# Patient Record
Sex: Female | Born: 1955 | Race: White | Hispanic: No | Marital: Married | Smoking: Never smoker
Health system: Southern US, Community
[De-identification: ages and names within clinical notes are randomized; demographics above are authoritative.]

## PROBLEM LIST (undated history)

## (undated) ENCOUNTER — Emergency Department (HOSPITAL_COMMUNITY): Discharge status: Absent without leave | Payer: BC Managed Care – PPO

## (undated) DIAGNOSIS — I639 Cerebral infarction, unspecified: Secondary | ICD-10-CM

## (undated) DIAGNOSIS — I10 Essential (primary) hypertension: Secondary | ICD-10-CM

## (undated) DIAGNOSIS — G0490 Encephalitis and encephalomyelitis, unspecified: Secondary | ICD-10-CM

## (undated) DIAGNOSIS — G459 Transient cerebral ischemic attack, unspecified: Secondary | ICD-10-CM

## (undated) HISTORY — PX: ABDOMINAL HYSTERECTOMY: SHX81

## (undated) HISTORY — DX: Transient cerebral ischemic attack, unspecified: G45.9

## (undated) HISTORY — DX: Encephalitis and encephalomyelitis, unspecified: G04.90

## (undated) HISTORY — DX: Cerebral infarction, unspecified: I63.9

---

## 1999-11-14 ENCOUNTER — Other Ambulatory Visit: Admission: RE | Admit: 1999-11-14 | Discharge: 1999-11-14 | Payer: Self-pay | Admitting: Gynecology

## 2000-11-18 ENCOUNTER — Other Ambulatory Visit: Admission: RE | Admit: 2000-11-18 | Discharge: 2000-11-18 | Payer: Self-pay | Admitting: Obstetrics and Gynecology

## 2002-02-07 ENCOUNTER — Other Ambulatory Visit: Admission: RE | Admit: 2002-02-07 | Discharge: 2002-02-07 | Payer: Self-pay | Admitting: Obstetrics and Gynecology

## 2003-03-01 ENCOUNTER — Other Ambulatory Visit: Admission: RE | Admit: 2003-03-01 | Discharge: 2003-03-01 | Payer: Self-pay | Admitting: Obstetrics and Gynecology

## 2003-11-23 ENCOUNTER — Encounter (INDEPENDENT_AMBULATORY_CARE_PROVIDER_SITE_OTHER): Payer: Self-pay | Admitting: Specialist

## 2003-11-23 ENCOUNTER — Inpatient Hospital Stay (HOSPITAL_COMMUNITY): Admission: RE | Admit: 2003-11-23 | Discharge: 2003-11-25 | Payer: Self-pay | Admitting: Obstetrics and Gynecology

## 2006-01-25 ENCOUNTER — Other Ambulatory Visit: Admission: RE | Admit: 2006-01-25 | Discharge: 2006-01-25 | Payer: Self-pay | Admitting: Obstetrics and Gynecology

## 2008-02-24 ENCOUNTER — Other Ambulatory Visit: Admission: RE | Admit: 2008-02-24 | Discharge: 2008-02-24 | Payer: Self-pay | Admitting: Obstetrics and Gynecology

## 2009-07-09 ENCOUNTER — Ambulatory Visit: Payer: Self-pay | Admitting: Obstetrics and Gynecology

## 2009-07-09 ENCOUNTER — Ambulatory Visit (HOSPITAL_COMMUNITY): Admission: RE | Admit: 2009-07-09 | Discharge: 2009-07-09 | Payer: Self-pay | Admitting: Obstetrics and Gynecology

## 2009-08-01 ENCOUNTER — Ambulatory Visit (HOSPITAL_COMMUNITY): Admission: RE | Admit: 2009-08-01 | Discharge: 2009-08-01 | Payer: Self-pay | Admitting: Obstetrics and Gynecology

## 2010-12-12 NOTE — Discharge Summary (Signed)
NAME:  Shawna Waters, Shawna Waters                           ACCOUNT NO.:  [id number]   MEDICAL RECORD NO.:  [id number]                   PATIENT TYPE:  INP   LOCATION:  9312                                 FACILITY:  [facility]   PHYSICIAN:  Daniel L. Eda Paschal, M.D.           DATE OF BIRTH:  1955/08/06   DATE OF ADMISSION:  11/23/2003  DATE OF DISCHARGE:  11/25/2003                                 DISCHARGE SUMMARY   The patient is a 55 year old female with persistent chronic pelvic pain,  right adnexal mass who entered the hospital for surgery.  On the day of  admission, she was taken to the operating room and exploratory laparotomy  was performed.  Findings were dense omental adhesions and bowel adhesions to  both adnexae.  There was a small right ovarian cyst.  The patient underwent  lysis of adhesions, bilateral salpingo-oophorectomy, and partial omentectomy  where the omentum was inflamed chronically from attachment to the above.  Postoperatively, her course progressed and by the second postoperative day,  she was voiding, passing gas, was afebrile, eating and was ready for  discharge.   DISCHARGE MEDICATIONS:  Motrin 800 mg p.r.n.   WOUND CARE:  Routine.   FOLLOW UP:  She will return to the office in two days for staple removal.   Final pathology report is not available at the time of dictation.   CONDITION ON DISCHARGE:  Improved.   DISCHARGE DIAGNOSES:  1. Right adnexal mass.  2. Pelvic adhesive disease.  3. Chronic pelvic pain.  4. Exploratory laparotomy with lysis of adhesions.  5. Bilateral salpingo-oophorectomy.  6. Partial omentectomy.                                               Daniel L. Eda Paschal, M.D.    Shawna Waters  D:  11/25/2003  T:  11/26/2003  Job:  [id number]

## 2010-12-12 NOTE — Op Note (Signed)
NAME:  Shawna Waters, Shawna Waters                           ACCOUNT NO.:  [id number]   MEDICAL RECORD NO.:  [id number]                   PATIENT TYPE:  INP   LOCATION:  9399                                 [facility]ACILITY:  [facility]   PHYSICIAN:  Daniel L. Eda Paschal, M.D.           DATE OF BIRTH:  11/05/1955   DATE OF PROCEDURE:  11/23/2003  DATE OF DISCHARGE:                                 OPERATIVE REPORT   PREOPERATIVE DIAGNOSIS:  Chronic pelvic pain with right adnexal mass.   POSTOPERATIVE DIAGNOSES:  1. Chronic pelvic adhesive disease with omental adhesions and inflammation     of the omentum.  2. Small right ovarian cyst.   OPERATION:  1. Exploratory laparotomy with lysis of all pelvic adhesions.  2. Bilateral salpingo-oophorectomy.  3. Partial omentectomy.   SURGEON:  Daniel L. Eda Paschal, M.D.   FIRST ASSISTANT:  Juan H. Lily Peer, M.D.   FINDINGS:  At the time of the surgery there was omentum adherent to both  adnexa, making the adnexa almost invisible.  Once the adhesions were freed  up, the patient had had a small right ovarian cyst, which had ruptured  during the dissection.  She had some chronic inflammation of where the  omentum was adherent to the ovary and other than that, all findings were  normal.  The ileocecal junction was identified, and the patient had a normal  appendix.   PROCEDURE:  After adequate general endotracheal anesthesia, the patient was  placed in the supine position, prepped and draped in the usual sterile  manner.  A Foley catheter was inserted in the patient's bladder.  The  patient's previous Pfannenstiel incision was used.  The fascia was opened  transversely.  The peritoneum was entered vertically.  Subcutaneous bleeders  were clamped and bovied as encountered.  When the peritoneal cavity was  opened, the above findings were noted.  Peritoneal washings were not  initially obtained because you could not even see the adnexa and I was not  really sure if she  still had her right adnexal mass.  The indications for  surgery had been pelvic pain as well as the mass, and therefore she had not  been re-scanned although we had had to cancel her surgery previously because  of an upper respiratory infection.  The omentum was densely adherent into  the pelvis.  As it was freed up, a portion of the ovary that was densely  adherent to it avulsed from the rest of the ovary, and there appeared to be  a small ovarian cyst in it.  It had had a bluish color but had ruptured  during the dissection.  It had a very non-suspicious appearance.  The  portion of the ovary with the cyst was dissected free from the omentum, was  sent separate for pathology.  The omental adhesions to the pelvis were  completely freed up by sharp dissection.  There was a lot of  inflammation of  the omentum and it was felt that this omentum should be removed, and  therefore a partial omentectomy was done with [facility].  All bleeding to the  omentum was controlled with 3-0 plain sutures.  Finally both adnexa were  free .  First on the right the round ligament was sutured and cut.  The  retroperitoneal space was entered.  The ureter was identified.  The IP  ligament was clamped, cut, and doubly suture ligated with #1 chromic catgut,  and then the ovary and tube were freed up from the rest of the peritoneum.  This whole area was extremely vascular and seemed to be chronically  inflamed, and therefore several areas had to be bovied with the Bovie tip.  The ureter was noted throughout the dissection so that it was not  compromised.  A hot pack was then placed there because there was still some  chronic ooze, and the third specimen sent was the remainder of the right  ovary and tube.  The first specimen had been the first portion of the ovary  as described above, and the second portion was the omentum.  Attention was  next turned to the left adnexa.  The round ligament was opened after being   sutured.  The retroperitoneal space was entered.  The ureter was identified.  The IP ligament was clamped and doubly suture ligated with #1 chromic  catgut.  Once again the area was very vascular when the ovary and tube were  freed up from the peritoneum, but this could be done.  Several areas had to  be bovied again.  The ureter was watched throughout the procedure, and then  the left ovary and tube were also sent to pathology for tissue diagnosis.  Hot packs were now placed in both adnexal beds.  After they were removed,  there was almost no bleeding whatsoever but Surgicel was left in both beds  anyway for hemostasis.  It was very clear that we had not left an ovarian  remnant.  Two sponge, needle, and instrument counts were correct.  The  peritoneum was closed with a running 0 Vicryl,  subfascial and suprafascial  spaces were copiously irrigated with Ringer's lactate, and then the fascia  was closed with two running 0 Vicryls.  The skin was then closed with  staples.  Estimated blood loss for the entire procedure was 150 mL with none  replaced.  The patient left the operating room in satisfactory condition  draining clear urine from her Foley catheter.                                               Daniel L. Eda Paschal, M.D.    Tonette Bihari  D:  11/23/2003  T:  11/23/2003  Job:  [id number]

## 2010-12-12 NOTE — H&P (Signed)
NAME:  Shawna Waters, Shawna Waters                           ACCOUNT NO.:  [id number]   MEDICAL RECORD NO.:  [id number]                   PATIENT TYPE:  INP   LOCATION:  NA                                   [facility]   PHYSICIAN:  Daniel L. Eda Paschal, M.D.           DATE OF BIRTH:  Nov 20, 1955   DATE OF ADMISSION:  DATE OF DISCHARGE:                                HISTORY & PHYSICAL   CHIEF COMPLAINT:  Pelvic pain, especially in the right lower quadrant.   HISTORY OF PRESENT ILLNESS:  The patient is a 55 year old Gravida I, Para 1,  AB 0 who came to see [place] in February with a one to two month history of right  lower quadrant pain.  It will intermittently go to the left lower quadrant  as well. It is hard to walk, it is hard for her to move and it is hard to  bend.  When she has a full bladder it hurts.  When she has a bowel movement  it hurts.  Intercourse has become very uncomfortable.  This pain has really  been going on a lot longer than the above, but it just got worse in  February.  She has a history of recurrent ovarian cysts in the past and when  she had her total abdominal hysterectomy for dysmenorrhea she also had an  ovarian cystectomy and that was in 1997.  When she first presented with this  at the end of February, she underwent an ultrasound which showed a right  adnexal mass.  It is approximately 6 x 5 x 4 cm and it is both cystic and  solid.  It has a reticular echo pattern.  It has a thick septation and it is  very tender to the touch.  Her surgery was originally scheduled earlier, but  it was canceled because she developed a severe upper respiratory infection.  We have not repeated the ultrasound to see if the mass persists because the  patient continues to have pain and it is felt that exploratory laparotomy  for the above is appropriate regardless of the change in ultrasound.  She  would like to have a bilateral salpingo-oophorectomy to prevent recurrences  of the above.  She  understands the issues with hormone replacement and  therefore we plan to do an exploratory laparotomy with bilateral salpingo-  oophorectomy.   PAST MEDICAL HISTORY:  1. Cesarean section.  2. Total abdominal hysterectomy and right ovarian cystectomy for pain and     dysmenorrhea.  3. Previous laparoscopy for dysmenorrhea.  4. T&A.   MEDICATIONS:  1. Advil.  2. Aleve.   ALLERGIES:  She is allergic to no drugs.   FAMILY HISTORY:  Negative except for coronary artery disease with both her  father and mother.   SOCIAL HISTORY:  She is a non-smoker and a non-drinker.   REVIEW OF SYMPTOMS:  HEENT:  Negative.  CARDIAC:  Negative.  RESPIRATORY:  Negative.  GI:  See above.  GU:  See above.  NEUROLOGIC:  Negative.  PSYCHIATRIC:  Negative.  ALLERGIC/IMMUNOLOGIC:  Negative.  LYMPHATIC:  Negative.  ENDOCRINE:  Negative.   PHYSICAL EXAMINATION:  GENERAL:  The patient is a well developed, well  nourished female in no acute distress.  VITAL SIGNS:  Her blood pressure is 120/86, pulse is 80 and regular,  respirations 16 and non-labored and she is afebrile.  HEENT:  All within normal limits.  The neck is supple.  Trachea is midline.  The thyroid is not enlarged.  LUNGS:  Clear to percussion and auscultation.  HEART:  No thrills, heaves or murmurs.  BREASTS:  No masses.  ABDOMEN: Soft without guarding, rebound or masses.  PELVIC:  External is normal.  Bartholin's, urethral and Skein's glands are  negative.  Vaginal is normal.  Pap smear shows no atypia.  Bimanual and  rectal examinations reveal extreme tenderness on the right making it very  difficult for assessment of the ovary.  EXTREMITIES:  Normal.   IMPRESSION:  Pelvic pain with probable persistent right adnexal mass.   PLAN:  Exploratory laparotomy with bilateral salpingo-oophorectomy.                                               Daniel L. Eda Paschal, M.D.    Tonette Bihari  D:  11/22/2003  T:  11/22/2003  Job:  [id number]

## 2014-09-27 ENCOUNTER — Observation Stay (HOSPITAL_COMMUNITY)
Admission: EM | Admit: 2014-09-27 | Discharge: 2014-09-28 | Disposition: A | Discharge status: Home / Self Care | Payer: BC Managed Care – PPO | Attending: Internal Medicine | Admitting: Internal Medicine

## 2014-09-27 ENCOUNTER — Encounter (HOSPITAL_COMMUNITY): Payer: Self-pay

## 2014-09-27 ENCOUNTER — Emergency Department (HOSPITAL_COMMUNITY): Payer: BC Managed Care – PPO

## 2014-09-27 DIAGNOSIS — Z6837 Body mass index (BMI) 37.0-37.9, adult: Secondary | ICD-10-CM | POA: Insufficient documentation

## 2014-09-27 DIAGNOSIS — G459 Transient cerebral ischemic attack, unspecified: Secondary | ICD-10-CM

## 2014-09-27 DIAGNOSIS — E663 Overweight: Secondary | ICD-10-CM

## 2014-09-27 DIAGNOSIS — R739 Hyperglycemia, unspecified: Secondary | ICD-10-CM

## 2014-09-27 DIAGNOSIS — R531 Weakness: Secondary | ICD-10-CM

## 2014-09-27 DIAGNOSIS — Z6841 Body Mass Index (BMI) 40.0 and over, adult: Secondary | ICD-10-CM | POA: Diagnosis not present

## 2014-09-27 DIAGNOSIS — I10 Essential (primary) hypertension: Secondary | ICD-10-CM | POA: Diagnosis not present

## 2014-09-27 DIAGNOSIS — R7309 Other abnormal glucose: Secondary | ICD-10-CM

## 2014-09-27 DIAGNOSIS — H538 Other visual disturbances: Secondary | ICD-10-CM | POA: Diagnosis not present

## 2014-09-27 DIAGNOSIS — R42 Dizziness and giddiness: Secondary | ICD-10-CM | POA: Insufficient documentation

## 2014-09-27 HISTORY — DX: Transient cerebral ischemic attack, unspecified: G45.9

## 2014-09-27 LAB — RAPID URINE DRUG SCREEN, HOSP PERFORMED
Amphetamines: NOT DETECTED
BARBITURATES: NOT DETECTED
Benzodiazepines: NOT DETECTED
COCAINE: NOT DETECTED
Opiates: NOT DETECTED
TETRAHYDROCANNABINOL: NOT DETECTED

## 2014-09-27 LAB — CBC
HEMATOCRIT: 40.6 % (ref 36.0–46.0)
HEMOGLOBIN: 13.8 g/dL (ref 12.0–15.0)
MCH: 32.2 pg (ref 26.0–34.0)
MCHC: 34 g/dL (ref 30.0–36.0)
MCV: 94.9 fL (ref 78.0–100.0)
Platelets: 319 10*3/uL (ref 150–400)
RBC: 4.28 MIL/uL (ref 3.87–5.11)
RDW: 12.6 % (ref 11.5–15.5)
WBC: 8.3 10*3/uL (ref 4.0–10.5)

## 2014-09-27 LAB — I-STAT TROPONIN, ED: Troponin i, poc: 0 ng/mL (ref 0.00–0.08)

## 2014-09-27 LAB — BASIC METABOLIC PANEL
Anion gap: 6 (ref 5–15)
BUN: 18 mg/dL (ref 6–23)
CALCIUM: 9.3 mg/dL (ref 8.4–10.5)
CO2: 26 mmol/L (ref 19–32)
CREATININE: 0.99 mg/dL (ref 0.50–1.10)
Chloride: 107 mmol/L (ref 96–112)
GFR calc Af Amer: 72 mL/min — ABNORMAL LOW (ref >90)
GFR, EST NON AFRICAN AMERICAN: 62 mL/min — AB (ref >90)
GLUCOSE: 108 mg/dL — AB (ref 70–99)
Potassium: 4.2 mmol/L (ref 3.5–5.1)
SODIUM: 139 mmol/L (ref 135–145)

## 2014-09-27 LAB — GLUCOSE, CAPILLARY: Glucose-Capillary: 131 mg/dL — ABNORMAL HIGH (ref 70–99)

## 2014-09-27 MED ORDER — SODIUM CHLORIDE 0.9 % IV SOLN
INTRAVENOUS | Status: AC
  Administered 2014-09-27: 23:00:00 via INTRAVENOUS

## 2014-09-27 MED ORDER — HYDRALAZINE HCL 20 MG/ML IJ SOLN: 10.0000 mg | Freq: Four times a day (QID) | INTRAMUSCULAR | Status: DC | PRN

## 2014-09-27 MED ORDER — HYDROCHLOROTHIAZIDE 12.5 MG PO CAPS
12.5000 mg | ORAL_CAPSULE | Freq: Every day | ORAL | Status: DC
  Administered 2014-09-28: 12.5 mg via ORAL
  Filled 2014-09-27: qty 1

## 2014-09-27 MED ORDER — ACETAMINOPHEN 325 MG PO TABS: 650.0000 mg | ORAL_TABLET | ORAL | Status: DC | PRN

## 2014-09-27 MED ORDER — STROKE: EARLY STAGES OF RECOVERY BOOK
Freq: Once | Status: AC
  Administered 2014-09-27
  Filled 2014-09-27: qty 1

## 2014-09-27 MED ORDER — LABETALOL HCL 5 MG/ML IV SOLN
10.0000 mg | Freq: Once | INTRAVENOUS | Status: DC
  Filled 2014-09-27: qty 4

## 2014-09-27 MED ORDER — SODIUM CHLORIDE 0.9 % IV SOLN
INTRAVENOUS | Status: DC
  Administered 2014-09-27: 21:00:00 via INTRAVENOUS

## 2014-09-27 MED ORDER — LISINOPRIL 20 MG PO TABS
20.0000 mg | ORAL_TABLET | Freq: Every day | ORAL | Status: DC
  Administered 2014-09-28: 20 mg via ORAL
  Filled 2014-09-27: qty 1

## 2014-09-27 MED ORDER — LABETALOL HCL 5 MG/ML IV SOLN
10.0000 mg | Freq: Once | INTRAVENOUS | Status: AC
  Administered 2014-09-27: 10 mg via INTRAVENOUS

## 2014-09-27 MED ORDER — ENOXAPARIN SODIUM 40 MG/0.4ML ~~LOC~~ SOLN
40.0000 mg | Freq: Every day | SUBCUTANEOUS | Status: DC
  Administered 2014-09-27: 40 mg via SUBCUTANEOUS
  Filled 2014-09-27: qty 0.4

## 2014-09-27 MED ORDER — LORAZEPAM 1 MG PO TABS
1.0000 mg | ORAL_TABLET | Freq: Once | ORAL | Status: AC
Start: 2014-09-28 — End: 2014-09-28
  Administered 2014-09-28: 1 mg via ORAL
  Filled 2014-09-27: qty 1

## 2014-09-27 MED ORDER — ONDANSETRON HCL 4 MG/2ML IJ SOLN
4.0000 mg | Freq: Once | INTRAMUSCULAR | Status: AC
  Administered 2014-09-27: 4 mg via INTRAVENOUS
  Filled 2014-09-27: qty 2

## 2014-09-27 NOTE — ED Provider Notes (Signed)
CSN: [id number]     Arrival date & time 09/27/14  1950 History   First MD Initiated Contact with Patient 09/27/14 2022     Chief Complaint  Patient presents with  . Hypertension  . Dizziness     (Consider location/radiation/quality/duration/timing/severity/associated sxs/prior Treatment) HPI   Shawna Waters is a [age] y.o. female is no known past medical history, has never had any primary care, has never been a smoker complaining of generalized weakness which she describes as worse in the right upper extremity with associated heaviness and nausea with lightheadedness. She had this sensation last night, symptoms resolved and it occurred this afternoon at 4:30, last approximately 10 minutes and went away. Symptoms recurred this evening, she didn't have any objective signs of weakness (she had not dropped anything) she proceeded to cook dinner without issue. She states the symptoms are improved but feel that they are still present. Patient denies chest pain, shortness of breath, syncope, nausea, vomiting, dysarthria, ataxia, change in vision, difficulty walking, cervicalgia.  History reviewed. No pertinent past medical history.    Past Surgical History  Procedure Laterality Date  . Abdominal hysterectomy     History reviewed. No pertinent family history. History  Substance Use Topics  . Smoking status: Never Smoker   . Smokeless tobacco: Not on file  . Alcohol Use: No   OB History    No data available     Review of Systems  10 systems reviewed and found to be negative, except as noted in the HPI.   Allergies  Codeine  Home Medications   Prior to Admission medications   Medication Sig Start Date End Date Taking? Authorizing Provider  acetaminophen (TYLENOL) 500 MG tablet Take 1,000 mg by mouth every 6 (six) hours as needed for headache (headache & sinuses).   Yes Historical Provider, MD   BP 170/76 mmHg  Pulse 74  Temp(Src) 98.6 F (37 C) (Oral)  Resp 14  Ht 5\' 1"  (1.549  m)  Wt 228 lb 1.6 oz (103.465 kg)  BMI 43.12 kg/m2  SpO2 93% Physical Exam  Constitutional: She is oriented to person, place, and time. She appears well-developed and well-nourished. No distress.  HENT:  Head: Normocephalic.  Eyes: Conjunctivae and EOM are normal.  Cardiovascular: Normal rate, regular rhythm and intact distal pulses.   Pulmonary/Chest: Effort normal and breath sounds normal. No stridor. No respiratory distress. She has no wheezes. She has no rales. She exhibits no tenderness.  Abdominal: Soft. Bowel sounds are normal. She exhibits no distension and no mass. There is no tenderness. There is no rebound and no guarding.  Musculoskeletal: Normal range of motion.  Neurological: She is alert and oriented to person, place, and time.  II-Visual fields grossly intact. III/IV/VI-Extraocular movements intact.  Pupils reactive bilaterally. V/VII-Smile symmetric, equal eyebrow raise,  facial sensation intact VIII- Hearing grossly intact IX/X-Normal gag XI-bilateral shoulder shrug XII-midline tongue extension Motor: 5/5 bilaterally with normal tone and bulk Cerebellar: Normal finger-to-nose  and normal heel-to-shin test.   Romberg negative Ambulates with a coordinated gait   Psychiatric: She has a normal mood and affect.  Nursing note and vitals reviewed.   ED Course  Procedures (including critical care time) Labs Review Labs Reviewed  BASIC METABOLIC PANEL - Abnormal; Notable for the following:    Glucose, Bld 108 (*)    GFR calc non Af Amer 62 (*)    GFR calc Af Amer 72 (*)    All other components within normal limits  CBC  URINE RAPID DRUG SCREEN (HOSP PERFORMED)  LIPID PANEL  CBC  COMPREHENSIVE METABOLIC PANEL  HEMOGLOBIN A1C  TSH  CBG MONITORING, ED  I-STAT TROPOININ, ED    Imaging Review Ct Head Wo Contrast  09/27/2014   CLINICAL DATA:  Weakness and dizziness  EXAM: CT HEAD WITHOUT CONTRAST  TECHNIQUE: Contiguous axial images were obtained from the base of  the skull through the vertex without intravenous contrast.  COMPARISON:  None.  FINDINGS: The bony calvarium is intact. No gross soft tissue abnormality is noted. No findings to suggest acute hemorrhage, acute infarction or space-occupying mass lesion are noted.  IMPRESSION: No acute intracranial abnormality noted.   Electronically Signed   By: Alcide Clever M.D.   On: 09/27/2014 21:42     EKG Interpretation   Date/Time:  Thursday September 27 2014 20:02:32 EST Ventricular Rate:  85 PR Interval:  131 QRS Duration: 82 QT Interval:  394 QTC Calculation: 468 R Axis:   58 Text Interpretation:  Sinus rhythm Low voltage, precordial leads No  previous ECGs available Confirmed by YAO  MD, DAVID ([id number]) on 09/27/2014  8:30:40 PM      MDM   Final diagnoses:  Weakness  Transient cerebral ischemia, unspecified transient cerebral ischemia type  TIA (transient ischemic attack)    Filed Vitals:   09/27/14 2200 09/27/14 2230 09/27/14 2303 09/27/14 2322  BP: 186/88 170/76    Pulse: 78 74    Temp:   98.6 F (37 C)   TempSrc:      Resp: 21 14    Height:     (1.549 m)  Weight:    228 lb 1.6 oz (103.465 kg)  SpO2: 96% 93%      Medications  0.9 %  sodium chloride infusion ( Intravenous New Bag/Given 09/27/14 2248)  enoxaparin (LOVENOX) injection 40 mg (40 mg Subcutaneous Given 09/27/14 2344)  acetaminophen (TYLENOL) tablet 650 mg (not administered)  hydrALAZINE (APRESOLINE) injection 10 mg (not administered)  lisinopril (PRINIVIL,ZESTRIL) tablet 20 mg (not administered)  hydrochlorothiazide (MICROZIDE) capsule 12.5 mg (not administered)  ondansetron (ZOFRAN) injection 4 mg (4 mg Intravenous Given 09/27/14 2049)  labetalol (NORMODYNE,TRANDATE) injection 10 mg (10 mg Intravenous Given 09/27/14 2249)   stroke: mapping our early stages of recovery book ( Does not apply Given 09/27/14 2344)    Shawna Waters is a pleasant 59 y.o. female presenting with lightheadedness and what she describes as right arm  heaviness, waxing and waning over the course last 24 hours. Patient states he symptoms are persistent but resolving. Neuro exam is nonfocal. Case discussed with attending physician who agrees with not calling a code stroke. Differential includes hypertensive emergency versus TIA.  CT negative. Case discussed with [facility] hospitalist Dr. Gwenlyn Perking who accepts admission, he recommends labetalol IV to manage her blood pressure.       Wynetta Emery, PA-C 09/27/14 2348  Richardean Canal, MD 10/01/14 [phone]

## 2014-09-27 NOTE — ED Notes (Signed)
Patient transported to CT 

## 2014-09-27 NOTE — Progress Notes (Signed)
EDCM spoke to patient's husband at bedside.  Patient in radiology. Patient's husband confirms patient has [employer] insurance and she does not have a pcp.  EDCM provided patient's husband with a list of pcps who accept [employer] insurance within a ten mile radius of patient's zip code [phone].  Patient's husband thankful for resources.  No further EDCM needs at this time.

## 2014-09-27 NOTE — H&P (Addendum)
[facility] History and Physical  Shawna Waters WUJ:[id number] DOB: Apr 05, 1956 DOA: 09/27/2014  Referring physician: Dr. Silverio Lay PCP: No primary care provider on file.   Chief Complaint: Blurred vision, headache, dizziness/lightheadedness, right arm weakness/heaviness sensation  HPI: Shawna Waters is a 59 y.o. female with a past medical history significant for overweight; who presented to the emergency department secondary to intermittent episodes of right arm weakness/heaviness sensation, lightheadedness and blurred vision. Patient reports the symptoms has been going on for the last 36-48 hours prior to admission (on and off). Patient reports that on the date of admission the symptoms lasted a little bit longer and were associated with nausea, HA and general malaise. She denies chest pain, chills/fever, shortness of breath, vomiting, abdominal pain, dysuria, melena and hematochezia. In the ED patient was found with a blood pressure of 207/110; blood work essentially unremarkable except for mild hyperglycemia; negative CT of the head and no abnormalities on EKG. Given her symptoms and presentation [facility] hospitalist has been called to admit for TIA.  Review of Systems:  Negative except as otherwise mentioned in history of present illness  History reviewed. No pertinent past medical history. Past Surgical History  Procedure Laterality Date  . Abdominal hysterectomy     Social History:  reports that she has never smoked. She does not have any smokeless tobacco history on file. She reports that she does not drink alcohol or use illicit drugs.  Allergies  Allergen Reactions  . Codeine    Family history: Father with history of cholesterol; and also grandmother with diabetes. Otherwise family history noncontributory according to patient  Prior to Admission medications   Medication Sig Start Date End Date Taking? Authorizing Provider  acetaminophen (TYLENOL) 500 MG tablet Take 1,000 mg by  mouth every 6 (six) hours as needed for headache (headache & sinuses).   Yes Historical Provider, MD   Physical Exam: Filed Vitals:   09/27/14 2051 09/27/14 2100 09/27/14 2200 09/27/14 2230  BP: 207/94 195/104 186/88 170/76  Pulse: 81 76 78 74  Temp:      TempSrc:      Resp: SpO2: 99% 98% 96% 93%    Wt Readings from Last 3 Encounters:  No data found for Wt    General:  Appears calm and comfortable; currently denying any abnormalities. Able to follow commands, normal speech, no facial droop, alert, awake and oriented X3. Afebrile Eyes: PERRL, normal lids, irises & conjunctiva, no icterus, no nystagmus; extraocular muscles intact on exam ENT: grossly normal hearing, lips & tongue, moist mucous membranes, no erythema, no exudates; no drainage out of her ears or nostrils Neck: no LAD, masses or thyromegaly; no JVD Cardiovascular: RRR, no m/r/g. Trace LE edema. Telemetry: SR, no arrhythmias appreciated on EKG and telemetry monitor in the ED Respiratory: CTA bilaterally, no w/r/r. Normal respiratory effort. Abdomen: soft, Obese, nontender, nondistended, positive bowel sounds Skin: no rash, petechiae, open wounds or induration seen on exam Musculoskeletal: grossly normal tone BUE/BLE; no joint swelling, FROM Psychiatric: grossly normal mood and affect, speech fluent and appropriate Neurologic: grossly non-focal. Muscle strength 5/5 bilaterally and symmetrically; cranial nerve II-12 within normal limits; normal finger to nose and normal pin-prick and proprioception.          Labs on Admission:  Basic Metabolic Panel:  Recent Labs Lab 09/27/14 2011  NA 139  K 4.2  CL 107  CO2 26  GLUCOSE 108*  BUN 18  CREATININE 0.99  CALCIUM 9.3  CBC:  Recent Labs Lab 09/27/14 2011  WBC 8.3  HGB 13.8  HCT 40.6  MCV 94.9  PLT 319   Radiological Exams on Admission: Ct Head Wo Contrast  09/27/2014   CLINICAL DATA:  Weakness and dizziness  EXAM: CT HEAD WITHOUT CONTRAST   TECHNIQUE: Contiguous axial images were obtained from the base of the skull through the vertex without intravenous contrast.  COMPARISON:  None.  FINDINGS: The bony calvarium is intact. No gross soft tissue abnormality is noted. No findings to suggest acute hemorrhage, acute infarction or space-occupying mass lesion are noted.  IMPRESSION: No acute intracranial abnormality noted.   Electronically Signed   By: Alcide CleverMark  Lukens M.D.   On: 09/27/2014 21:42    EKG:  Sinus rhythm, no acute ischemic changes appreciated on EKG  Assessment/Plan 1-TIA (transient ischemic attack): Patient with transient ischemic attack symptoms (right arm heaviness and weakness, lightheadedness and blurred vision), symptoms resolved spontaneously, especially once blood pressure was under better control. Patient with CT head negative for acute intracranial abnormalities (ruling out with these any hemorrhagic components). -Will admit to telemetry -Will follow TIA order sets protocol (MRI/MRA, 2-D echo, carotid Dopplers; will check TSH, lipid panel and A1c) -Patient has passed swallowing evaluation while in the ED and she will be started on heart healthy diet. -In order to continue controlling blood pressure will initiate lisinopril 20 mg by mouth daily and also HCTZ 12.5 mg by mouth daily. -PRN Hydralazine will be also available for systolic blood pressure above 161180 and/or diastolic blood pressure more than 110   2-HTN (hypertension)/accelerated HTN: Newly diagnosed and most likely cause for TIA. -As mentioned above will initiate treatment with HCTZ and lisinopril  -Patient advised to follow a low-sodium diet and also to lose weight   3-Obesity: Low calorie diet and exercise discussed with patient. -Patient is motivated and was already following healthier habits prior to this admission. -Body mass index is 43.12 kg/(m^2).  4-hyperglycemia: Without history of diabetes. -Will check hemoglobin A1c  5-Social: CM ordered has  been placed for assistance establishing PCP  Please follow MRI results and if needed discussed with the Neurohospitalist in the morning..  Code Status: Full code DVT Prophylaxis: Lovenox Family Communication: Husband and sister at bedside Disposition Plan: Telemetry bed, observation; LOS less than 2 midnights  Time spent: 55 minutes  Vassie LollMadera, Colby Catanese [facility] Pager [phone]

## 2014-09-27 NOTE — ED Notes (Signed)
Pt states started having all over weakness and dizziness this evening around 1630, went away and she cooked dinner, then started having the weakness/nausea and dizziness again, denies shortness of breath, denies chest pain or any other pain, denies any medical hx. Pt ambulated to restroom w/o difficulty.

## 2014-09-27 NOTE — Progress Notes (Signed)
While this RN was out of the room, patient began to fell "heavy", nauseated, and weaker than she felt only a few minutes before. Pt states she also felt suddenly hot. Neuro assessment is negative. Nausea has passed, but heaviness and feelings of weakness remain. Will continue to monitor.

## 2014-09-27 NOTE — ED Notes (Signed)
Pt reports R arm weakness, dizziness, blurred vision which started today at 1630 which resolved and recurred at 1830 tonight.

## 2014-09-28 ENCOUNTER — Observation Stay (HOSPITAL_COMMUNITY): Payer: BC Managed Care – PPO

## 2014-09-28 DIAGNOSIS — G459 Transient cerebral ischemic attack, unspecified: Secondary | ICD-10-CM

## 2014-09-28 LAB — CBC
HCT: 38.5 % (ref 36.0–46.0)
Hemoglobin: 12.4 g/dL (ref 12.0–15.0)
MCH: 30.8 pg (ref 26.0–34.0)
MCHC: 32.2 g/dL (ref 30.0–36.0)
MCV: 95.8 fL (ref 78.0–100.0)
PLATELETS: 321 10*3/uL (ref 150–400)
RBC: 4.02 MIL/uL (ref 3.87–5.11)
RDW: 12.7 % (ref 11.5–15.5)
WBC: 7.3 10*3/uL (ref 4.0–10.5)

## 2014-09-28 LAB — LIPID PANEL
CHOLESTEROL: 183 mg/dL (ref 0–200)
HDL: 40 mg/dL (ref >39)
LDL Cholesterol: 112 mg/dL — ABNORMAL HIGH (ref 0–99)
TRIGLYCERIDES: 157 mg/dL — AB (ref <150)
Total CHOL/HDL Ratio: 4.6 RATIO
VLDL: 31 mg/dL (ref 0–40)

## 2014-09-28 LAB — COMPREHENSIVE METABOLIC PANEL
ALK PHOS: 74 U/L (ref 39–117)
ALT: 22 U/L (ref 0–35)
ANION GAP: 9 (ref 5–15)
AST: 24 U/L (ref 0–37)
Albumin: 3.8 g/dL (ref 3.5–5.2)
BILIRUBIN TOTAL: 0.4 mg/dL (ref 0.3–1.2)
BUN: 14 mg/dL (ref 6–23)
CO2: 23 mmol/L (ref 19–32)
CREATININE: 0.89 mg/dL (ref 0.50–1.10)
Calcium: 8.7 mg/dL (ref 8.4–10.5)
Chloride: 107 mmol/L (ref 96–112)
GFR calc Af Amer: 82 mL/min — ABNORMAL LOW (ref >90)
GFR, EST NON AFRICAN AMERICAN: 71 mL/min — AB (ref >90)
Glucose, Bld: 102 mg/dL — ABNORMAL HIGH (ref 70–99)
Potassium: 3.8 mmol/L (ref 3.5–5.1)
Sodium: 139 mmol/L (ref 135–145)
Total Protein: 6.7 g/dL (ref 6.0–8.3)

## 2014-09-28 LAB — GLUCOSE, CAPILLARY
Glucose-Capillary: 75 mg/dL (ref 70–99)
Glucose-Capillary: 96 mg/dL (ref 70–99)

## 2014-09-28 LAB — TSH: TSH: 4.912 u[IU]/mL — AB (ref 0.350–4.500)

## 2014-09-28 MED ORDER — ASPIRIN EC 81 MG PO TBEC: 81.0000 mg | DELAYED_RELEASE_TABLET | Freq: Every day | ORAL | Status: DC

## 2014-09-28 MED ORDER — LORAZEPAM 1 MG PO TABS
1.0000 mg | ORAL_TABLET | Freq: Once | ORAL | Status: AC
  Administered 2014-09-28: 1 mg via ORAL
  Filled 2014-09-28: qty 1

## 2014-09-28 MED ORDER — LISINOPRIL-HYDROCHLOROTHIAZIDE 20-12.5 MG PO TABS: 1.0000 | ORAL_TABLET | Freq: Every day | ORAL | Status: DC

## 2014-09-28 NOTE — Discharge Instructions (Signed)

## 2014-09-28 NOTE — Progress Notes (Signed)
VASCULAR LAB PRELIMINARY  PRELIMINARY  PRELIMINARY  PRELIMINARY  Carotid duplex completed.    Preliminary report:  Bilateral:  1-39% ICA stenosis. Minimal intimal wall changes  Vertebral artery flow is antegrade.     Shawna Waters, RVS 09/28/2014, 8:32 AM

## 2014-09-28 NOTE — Progress Notes (Signed)
UR completed 

## 2014-09-28 NOTE — Evaluation (Signed)
Physical Therapy Evaluation Patient Details Name: Shawna Waters MRN: [id number] DOB: [birth year] Today's Date: 09/28/2014   History of Present Illness  59 yo female admitted with TIA, HA, R UE weakness/heaviness.   Clinical Impression  On eval, pt was supervision level assist for mobility-able to walk ~300 feet. Slightly unsteady at times but no overt LOB. Pt and husband with no further questions/concerns. Instructed pt to increase activity slowly. No follow up PT needs at this time.     Follow Up Recommendations No PT follow up    Equipment Recommendations  None recommended by PT    Recommendations for Other Services       Precautions / Restrictions Precautions Precautions: None Restrictions Weight Bearing Restrictions: No      Mobility  Bed Mobility Overal bed mobility: Modified Independent                Transfers Overall transfer level: Modified independent                  Ambulation/Gait Ambulation/Gait assistance: Supervision Ambulation Distance (Feet): 300 Feet Assistive device: None Gait Pattern/deviations: WFL(Within Functional Limits)        Stairs Stairs: Yes Stairs assistance: Min guard Stair Management: Step to pattern;Forwards;One rail Left Number of Stairs: 5 General stair comments: Some difficulty due to L knee issues per pt. close guard for safety  Wheelchair Mobility    Modified Rankin (Stroke Patients Only)       Balance           Standing balance support: No upper extremity supported;During functional activity Standing balance-Leahy Scale: Good               High level balance activites: Side stepping;Backward walking;Direction changes;Turns;Sudden stops;Head turns High Level Balance Comments: All with supervision-Min guard assist. No LOB. Increased time for some tasks.              Pertinent Vitals/Pain Pain Assessment: No/denies pain    Home Living Family/patient expects to be discharged to::  Private residence Living Arrangements: Spouse/significant other Available Help at Discharge: Family Type of Home: House Home Access: Stairs to enter Entrance Stairs-Rails: Right Entrance Stairs-Number of Steps: 3 Home Layout: One level Home Equipment: None      Prior Function Level of Independence: Independent               Hand Dominance        Extremity/Trunk Assessment   Upper Extremity Assessment: RUE deficits/detail RUE Deficits / Details: Pt denies numbness/weakness/heaviness. Able to move R UE but slowly and guarded-pt blames this and soreness on IV site at elbow.          Lower Extremity Assessment: Overall WFL for tasks assessed      Cervical / Trunk Assessment: Normal  Communication   Communication: No difficulties  Cognition Arousal/Alertness: Awake/alert Behavior During Therapy: WFL for tasks assessed/performed Overall Cognitive Status: Within Functional Limits for tasks assessed                      General Comments      Exercises        Assessment/Plan    PT Assessment Patent does not need any further PT services  PT Diagnosis     PT Problem List    PT Treatment Interventions     PT Goals (Current goals can be found in the Care Plan section) Acute Rehab PT Goals Patient Stated Goal: home PT Goal Formulation: All assessment and  education complete, DC therapy    Frequency     Barriers to discharge        Co-evaluation               End of Session Equipment Utilized During Treatment: Gait belt Activity Tolerance: Patient tolerated treatment well Patient left: in bed;with call bell/phone within reach;with family/visitor present      Functional Assessment Tool Used: clinical judgement Functional Limitation: Mobility: Walking and moving around Mobility: Walking and Moving Around Current Status (R6045): At least 1 percent but less than 20 percent impaired, limited or restricted Mobility: Walking and Moving Around  Goal Status [phone]): At least 1 percent but less than 20 percent impaired, limited or restricted Mobility: Walking and Moving Around Discharge Status [phone]): At least 1 percent but less than 20 percent impaired, limited or restricted    Time: 1222-1234 PT Time Calculation (min) (ACUTE ONLY): 12 min   Charges:   PT Evaluation $Initial PT Evaluation Tier I: 1 Procedure     PT G Codes:   PT G-Codes **NOT FOR INPATIENT CLASS** Functional Assessment Tool Used: clinical judgement Functional Limitation: Mobility: Walking and moving around Mobility: Walking and Moving Around Current Status (W2956): At least 1 percent but less than 20 percent impaired, limited or restricted Mobility: Walking and Moving Around Goal Status [phone]): At least 1 percent but less than 20 percent impaired, limited or restricted Mobility: Walking and Moving Around Discharge Status [phone]): At least 1 percent but less than 20 percent impaired, limited or restricted    Rebeca Alert, MPT Pager: [phone]

## 2014-09-28 NOTE — Progress Notes (Signed)
Echocardiogram 2D Echocardiogram has been performed.  Shawna Waters 09/28/2014, 9:53 AM

## 2014-09-28 NOTE — Discharge Summary (Signed)
Physician Discharge Summary  Shawna Waters WUJ:[id number] DOB: 1956/07/18 DOA: 09/27/2014  PCP: No primary care provider on file.  Admit date: 09/27/2014 Discharge date: 09/28/2014  Recommendations for Outpatient Follow-up:  1. Pt will need to follow up with PCP in 2-3 weeks post discharge 2. Please obtain BMP to evaluate electrolytes and kidney function 3. Please also check CBC to evaluate Hg and Hct levels 4. Please note that pt was started on Aspirin 81 mg PO QD per neurology recommendations 5. Pt also started on Lisinopril-HCTZ  6. 2 D ECHO pending upon discharge and pt made aware   Discharge Diagnoses:  Principal Problem:   TIA (transient ischemic attack) Active Problems:   HTN (hypertension)   Severe obesity (BMI >= 40)  Discharge Condition: Stable  Diet recommendation: Heart healthy diet discussed in details   History of present illness:  59 y.o. female with a past medical history significant for overweight; who presented to the emergency department secondary to intermittent episodes of right arm weakness/heaviness sensation, lightheadedness and blurred vision. Patient reports the symptoms has been going on for the last 36-48 hours prior to admission (on and off). Patient reports that on the date of admission the symptoms lasted a little bit longer and were associated with nausea, She denied chest pain, chills/fever, shortness of breath, vomiting, abdominal pain, dysuria, melena and hematochezia.  In the ED patient was found with a blood pressure of 207/110; blood work essentially unremarkable except for mild hyperglycemia; negative CT of the head and no abnormalities on EKG. Given her symptoms and presentation [facility] has been called to admit for TIA.  Hospital Course:  Principal Problem:   TIA (transient ischemic attack) - symptoms now resolved - MRI with no signs of stroke but with atherosclerotic disease noted in some blood vessels as noted below - neurologist  recommended aspirin 81 mg PO QD and dietary changes - pt tolerated PT well and wants to go home  - 2 D ECHO pending upon discharge and pt made aware  Active Problems:   HTN (hypertension) - blood pressure better controlled - started on Lisinopril - HCTZ   Severe obesity (BMI >= 40)  Procedures/Studies: Ct Head Wo Contrast  09/27/2014   CLINICAL DATA:  Weakness and dizziness  EXAM: CT HEAD WITHOUT CONTRAST  TECHNIQUE: Contiguous axial images were obtained from the base of the skull through the vertex without intravenous contrast.  COMPARISON:  None.  FINDINGS: The bony calvarium is intact. No gross soft tissue abnormality is noted. No findings to suggest acute hemorrhage, acute infarction or space-occupying mass lesion are noted.  IMPRESSION: No acute intracranial abnormality noted.   Electronically Signed   By: Alcide Clever M.D.   On: 09/27/2014 21:42   Mri Brain Without Contrast  09/28/2014   CLINICAL DATA:  TIA. Episodes of right arm weakness and heaviness. Episodes of lightheadedness and blurred vision. Occasional nausea.  EXAM: MRI HEAD WITHOUT CONTRAST  MRA HEAD WITHOUT CONTRAST  TECHNIQUE: Multiplanar, multiecho pulse sequences of the brain and surrounding structures were obtained without intravenous contrast. Angiographic images of the head were obtained using MRA technique without contrast.  COMPARISON:  CT head without contrast 09/27/2014.  FINDINGS: MRI HEAD FINDINGS  The diffusion-weighted images demonstrate no evidence for acute or subacute infarction. No hemorrhage or mass lesion is present. No significant white matter disease is present.  The ventricles are of normal size. No significant extraaxial fluid collection is present. Flow is present in the major intracranial arteries. The globes and  orbits are intact.  Mild mucosal thickening is present in the anterior ethmoid air cells and inferior frontal sinuses. The paranasal sinuses and mastoid air cells are otherwise clear.  The skullbase is  unremarkable.  MRA HEAD FINDINGS  Internal carotid arteries are within normal limits from the high cervical segments through the ICA termini. The A1 and M1 segments are normal. There is minimal attenuation of distal MCA branch vessels.  The right vertebral artery feeds the basilar artery. The left vertebral artery is hypoplastic and terminates at the PICA. The basilar artery is normal. Both posterior cerebral arteries originate from the basilar tip. There is focal signal loss in the proximal left posterior cerebral artery and asymmetric attenuation of distal PCA branch vessels on the left.  IMPRESSION: 1. Normal MRI appearance of the brain. No acute or focal lesion to explain the patient's symptoms. 2. Minimal sinus disease. 3. 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 Given the lack of other proximal disease, CTA could be used for further evaluation in the appropriate clinical setting. Correlation with the patient's symptoms would be prudent. 4. Minimal distal small vessel disease is present without other significant proximal stenosis, aneurysm, or branch vessel occlusion.   Electronically Signed   By: Marin Roberts M.D.   On: 09/28/2014 07:22   Mr Maxine Glenn Head/brain Wo Cm  09/28/2014   CLINICAL DATA:  TIA. Episodes of right arm weakness and heaviness. Episodes of lightheadedness and blurred vision. Occasional nausea.  EXAM: MRI HEAD WITHOUT CONTRAST  MRA HEAD WITHOUT CONTRAST  TECHNIQUE: Multiplanar, multiecho pulse sequences of the brain and surrounding structures were obtained without intravenous contrast. Angiographic images of the head were obtained using MRA technique without contrast.  COMPARISON:  CT head without contrast 09/27/2014.  FINDINGS: MRI HEAD FINDINGS  The diffusion-weighted images demonstrate no evidence for acute or subacute infarction. No hemorrhage or mass lesion is present. No significant white matter disease is present.   The ventricles are of normal size. No significant extraaxial fluid collection is present. Flow is present in the major intracranial arteries. The globes and orbits are intact.  Mild mucosal thickening is present in the anterior ethmoid air cells and inferior frontal sinuses. The paranasal sinuses and mastoid air cells are otherwise clear.  The skullbase is unremarkable.  MRA HEAD FINDINGS  Internal carotid arteries are within normal limits from the high cervical segments through the ICA termini. The A1 and M1 segments are normal. There is minimal attenuation of distal MCA branch vessels.  The right vertebral artery feeds the basilar artery. The left vertebral artery is hypoplastic and terminates at the PICA. The basilar artery is normal. Both posterior cerebral arteries originate from the basilar tip. There is focal signal loss in the proximal left posterior cerebral artery and asymmetric attenuation of distal PCA branch vessels on the left.  IMPRESSION: 1. Normal MRI appearance of the brain. No acute or focal lesion to explain the patient's symptoms. 2. Minimal sinus disease. 3. 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 Given the lack of other proximal disease, CTA could be used for further evaluation in the appropriate clinical setting. Correlation with the patient's symptoms would be prudent. 4. Minimal distal small vessel disease is present without other significant proximal stenosis, aneurysm, or branch vessel occlusion.   Electronically Signed   By: Marin Roberts M.D.   On: 09/28/2014 07:22    Consultations:  Neurology over the  phone   Antibiotics:  None   Discharge Exam: Filed Vitals:   09/28/14 0529  BP: 156/79  Pulse: 72  Temp: 98.2 F (36.8 C)  Resp: 16   Filed Vitals:   09/27/14 2322 09/27/14 2352 09/28/14 0233 09/28/14 0529  BP:  172/82 154/86 156/79  Pulse:  76 85 72  Temp:  98 F (36.7 C) 98.9 F (37.2 C)  98.2 F (36.8 C)  TempSrc:  Oral Oral Oral  Resp:  20 16 16   Height: 5\' 1"  (1.549 m)     Weight: 103.465 kg (228 lb 1.6 oz)     SpO2:  96% 98% 98%    General: Pt is alert, follows commands appropriately, not in acute distress Cardiovascular: Regular rate and rhythm, S1/S2 +, no murmurs, no rubs, no gallops Respiratory: Clear to auscultation bilaterally, no wheezing, no crackles, no rhonchi Abdominal: Soft, non tender, non distended, bowel sounds +, no guarding Extremities: no edema, no cyanosis, pulses palpable bilaterally DP and PT Neuro: Grossly nonfocal  Discharge Instructions  Discharge Instructions    Diet - low sodium heart healthy    Complete by:  As directed      Increase activity slowly    Complete by:  As directed             Medication List    TAKE these medications        acetaminophen 500 MG tablet  Commonly known as:  TYLENOL  Take 1,000 mg by mouth every 6 (six) hours as needed for headache (headache & sinuses).     aspirin EC 81 MG tablet  Take 1 tablet (81 mg total) by mouth daily.     lisinopril-hydrochlorothiazide 20-12.5 MG per tablet  Commonly known as:  PRINZIDE,ZESTORETIC  Take 1 tablet by mouth daily.           Follow-up Information    Follow up with [facility]    .   Contact information:   [address] [place] [id number] [phone]      Follow up with Debbora PrestoMAGICK-Tammie Ellsworth, MD.   Specialty:  Internal Medicine   Why:  As needed call my cell phone [phone]   Contact information:   [address] [place] [address] [phone]        The results of significant diagnostics from this hospitalization (including imaging, microbiology, ancillary and laboratory) are listed below for reference.     Microbiology: No results found for this or any previous visit (from the past 240 hour(s)).   Labs: Basic Metabolic Panel:  Recent Labs Lab 09/27/14 2011  09/28/14 0516  NA 139 139  K 4.2 3.8  CL 107 107  CO2 26 23  GLUCOSE 108* 102*  BUN 18 14  CREATININE 0.99 0.89  CALCIUM 9.3 8.7   Liver Function Tests:  Recent Labs Lab 09/28/14 0516  AST 24  ALT 22  ALKPHOS 74  BILITOT 0.4  PROT 6.7  ALBUMIN 3.8   No results for input(s): LIPASE, AMYLASE in the last 168 hours. No results for input(s): AMMONIA in the last 168 hours. CBC:  Recent Labs Lab 09/27/14 2011 09/28/14 0516  WBC 8.3 7.3  HGB 13.8 12.4  HCT 40.6 38.5  MCV 94.9 95.8  PLT 319 321   Cardiac Enzymes: No results for input(s): CKTOTAL, CKMB, CKMBINDEX, TROPONINI in the last 168 hours. BNP: BNP (last 3 results) No results for input(s): BNP in the last 8760 hours.  ProBNP (last 3 results) No results for input(s): PROBNP in the last 8760 hours.  CBG:  Recent Labs Lab 09/27/14 2000 09/27/14 2339 09/28/14 0747  GLUCAP 75 131* 96     SIGNED: Time coordinating discharge: Over 30 minutes  Debbora Presto, MD  [facility] 09/28/2014, 10:32 AM Pager [phone]  If 7PM-7AM, please contact night-coverage [email or web address] Password [email or web address]

## 2014-09-29 LAB — HEMOGLOBIN A1C
HEMOGLOBIN A1C: 5.6 % (ref 4.8–5.6)
MEAN PLASMA GLUCOSE: 114 mg/dL

## 2014-10-01 ENCOUNTER — Encounter (HOSPITAL_COMMUNITY): Payer: Self-pay | Admitting: Emergency Medicine

## 2014-10-01 ENCOUNTER — Telehealth: Payer: Self-pay | Admitting: *Deleted

## 2014-10-01 ENCOUNTER — Emergency Department (HOSPITAL_COMMUNITY)
Admission: EM | Admit: 2014-10-01 | Discharge: 2014-10-01 | Disposition: A | Discharge status: Home / Self Care | Payer: BC Managed Care – PPO | Attending: Emergency Medicine | Admitting: Emergency Medicine

## 2014-10-01 DIAGNOSIS — G459 Transient cerebral ischemic attack, unspecified: Secondary | ICD-10-CM | POA: Diagnosis not present

## 2014-10-01 DIAGNOSIS — M629 Disorder of muscle, unspecified: Secondary | ICD-10-CM | POA: Insufficient documentation

## 2014-10-01 DIAGNOSIS — Z79899 Other long term (current) drug therapy: Secondary | ICD-10-CM | POA: Insufficient documentation

## 2014-10-01 DIAGNOSIS — R42 Dizziness and giddiness: Secondary | ICD-10-CM | POA: Diagnosis not present

## 2014-10-01 DIAGNOSIS — I10 Essential (primary) hypertension: Secondary | ICD-10-CM | POA: Insufficient documentation

## 2014-10-01 DIAGNOSIS — Z7982 Long term (current) use of aspirin: Secondary | ICD-10-CM | POA: Insufficient documentation

## 2014-10-01 DIAGNOSIS — M6281 Muscle weakness (generalized): Secondary | ICD-10-CM | POA: Diagnosis present

## 2014-10-01 DIAGNOSIS — G458 Other transient cerebral ischemic attacks and related syndromes: Secondary | ICD-10-CM

## 2014-10-01 HISTORY — DX: Essential (primary) hypertension: I10

## 2014-10-01 NOTE — ED Notes (Signed)
Pt placed in gown and in bed. Monitored by pulse ox, bp cuff, and 12-lead. 

## 2014-10-01 NOTE — Telephone Encounter (Signed)
-----   Message from Harrington ChallengerNancy J Young, NP sent at 10/01/2014  8:29 AM EST ----- Selmer DominionJennifer, Tony was seen in the Hospital for multiple TIAs on Thursday/Friday discharged Saturday continues to have TIAs, needs follow-up with cardiologist today, Dr. Hurman HornStephen Klein [facility]. Let me know if you cannot get anything for her today. She needs someone to evaluate possible coronary disease, her father had quadruple bypass in the past.

## 2014-10-01 NOTE — Telephone Encounter (Signed)
When speaking with [place] [phone] office, I was informed that Shawna will need to speak with MD on call [phone]. I relayed this information to Shawna young, Np

## 2014-10-01 NOTE — Discharge Instructions (Signed)
Neurohosptialist Dr. Cyril Mourning recommends a follow-up with neurologist Dr. Marjory Lies. See above for follow up information.   Transient Ischemic Attack A transient ischemic attack (TIA) is a "warning stroke" that causes stroke-like symptoms. Unlike a stroke, a TIA does not cause permanent damage to the brain. The symptoms of a TIA can happen very fast and do not last long. It is important to know the symptoms of a TIA and what to do. This can help prevent a major stroke or death. CAUSES   A TIA is caused by a temporary blockage in an artery in the brain or neck (carotid artery). The blockage does not allow the brain to get the blood supply it needs and can cause different symptoms. The blockage can be caused by either:  A blood clot.  Fatty buildup (plaque) in a neck or brain artery. RISK FACTORS  High blood pressure (hypertension).  High cholesterol.  Diabetes mellitus.  Heart disease.  The build up of plaque in the blood vessels (peripheral artery disease or atherosclerosis).  The build up of plaque in the blood vessels providing blood and oxygen to the brain (carotid artery stenosis).  An abnormal heart rhythm (atrial fibrillation).  Obesity.  Smoking.  Taking oral contraceptives (especially in combination with smoking).  Physical inactivity.  A diet high in fats, salt (sodium), and calories.  Alcohol use.  Use of illegal drugs (especially cocaine and methamphetamine).  Being female.  Being African American.  Being over the age of 69.  Family history of stroke.  Previous history of blood clots, stroke, TIA, or heart attack.  Sickle cell disease. SYMPTOMS  TIA symptoms are the same as a stroke but are temporary. These symptoms usually develop suddenly, or may be newly present upon awakening from sleep:  Sudden weakness or numbness of the face, arm, or leg, especially on one side of the body.  Sudden trouble walking or difficulty moving arms or legs.  Sudden  confusion.  Sudden personality changes.  Trouble speaking (aphasia) or understanding.  Difficulty swallowing.  Sudden trouble seeing in one or both eyes.  Double vision.  Dizziness.  Loss of balance or coordination.  Sudden severe headache with no known cause.  Trouble reading or writing.  Loss of bowel or bladder control.  Loss of consciousness. DIAGNOSIS  Your caregiver may be able to determine the presence or absence of a TIA based on your symptoms, history, and physical exam. Computed tomography (CT scan) of the brain is usually performed to help identify a TIA. Other tests may be done to diagnose a TIA. These tests may include:  Electrocardiography.  Continuous heart monitoring.  Echocardiography.  Carotid ultrasonography.  Magnetic resonance imaging (MRI).  A scan of the brain circulation.  Blood tests. PREVENTION  The risk of a TIA can be decreased by appropriately treating high blood pressure, high cholesterol, diabetes, heart disease, and obesity and by quitting smoking, limiting alcohol, and staying physically active. TREATMENT  Time is of the essence. Since the symptoms of TIA are the same as a stroke, it is important to seek treatment as soon as possible because you may need a medicine to dissolve the clot (thrombolytic) that cannot be given if too much time has passed. Treatment options vary. Treatment options may include rest, oxygen, intravenous (IV) fluids, and medicines to thin the blood (anticoagulants). Medicines and diet may be used to address diabetes, high blood pressure, and other risk factors. Measures will be taken to prevent short-term and long-term complications, including infection from breathing  foreign material into the lungs (aspiration pneumonia), blood clots in the legs, and falls. Treatment options include procedures to either remove plaque in the carotid arteries or dilate carotid arteries that have narrowed due to plaque. Those procedures  are:  Carotid endarterectomy.  Carotid angioplasty and stenting. HOME CARE INSTRUCTIONS   Take all medicines prescribed by your caregiver. Follow the directions carefully. Medicines may be used to control risk factors for a stroke. Be sure you understand all your medicine instructions.  You may be told to take aspirin or the anticoagulant warfarin. Warfarin needs to be taken exactly as instructed.  Taking too much or too little warfarin is dangerous. Too much warfarin increases the risk of bleeding. Too little warfarin continues to allow the risk for blood clots. While taking warfarin, you will need to have regular blood tests to measure your blood clotting time. A PT blood test measures how long it takes for blood to clot. Your PT is used to calculate another value called an INR. Your PT and INR help your caregiver to adjust your dose of warfarin. The dose can change for many reasons. It is critically important that you take warfarin exactly as prescribed.  Many foods, especially foods high in vitamin K can interfere with warfarin and affect the PT and INR. Foods high in vitamin K include spinach, kale, broccoli, cabbage, collard and turnip greens, brussels sprouts, peas, cauliflower, seaweed, and parsley as well as beef and pork liver, green tea, and soybean oil. You should eat a consistent amount of foods high in vitamin K. Avoid major changes in your diet, or notify your caregiver before changing your diet. Arrange a visit with a dietitian to answer your questions.  Many medicines can interfere with warfarin and affect the PT and INR. You must tell your caregiver about any and all medicines you take, this includes all vitamins and supplements. Be especially cautious with aspirin and anti-inflammatory medicines. Do not take or discontinue any prescribed or over-the-counter medicine except on the advice of your caregiver or pharmacist.  Warfarin can have side effects, such as excessive bruising or  bleeding. You will need to hold pressure over cuts for longer than usual. Your caregiver or pharmacist will discuss other potential side effects.  Avoid sports or activities that may cause injury or bleeding.  Be mindful when shaving, flossing your teeth, or handling sharp objects.  Alcohol can change the body's ability to handle warfarin. It is best to avoid alcoholic drinks or consume only very small amounts while taking warfarin. Notify your caregiver if you change your alcohol intake.  Notify your dentist or other caregivers before procedures.  Eat a diet that includes 5 or more servings of fruits and vegetables each day. This may reduce the risk of stroke. Certain diets may be prescribed to address high blood pressure, high cholesterol, diabetes, or obesity.  A low-sodium, low-saturated fat, low-trans fat, low-cholesterol diet is recommended to manage high blood pressure.  A low-saturated fat, low-trans fat, low-cholesterol, and high-fiber diet may control cholesterol levels.  A controlled-carbohydrate, controlled-sugar diet is recommended to manage diabetes.  A reduced-calorie, low-sodium, low-saturated fat, low-trans fat, low-cholesterol diet is recommended to manage obesity.  Maintain a healthy weight.  Stay physically active. It is recommended that you get at least 30 minutes of activity on most or all days.  Do not smoke.  Limit alcohol use even if you are not taking warfarin. Moderate alcohol use is considered to be:  No more than 2 drinks each  day for men.  No more than 1 drink each day for nonpregnant women.  Stop drug abuse.  Home safety. A safe home environment is important to reduce the risk of falls. Your caregiver may arrange for specialists to evaluate your home. Having grab bars in the bedroom and bathroom is often important. Your caregiver may arrange for equipment to be used at home, such as raised toilets and a seat for the shower.  Follow all instructions  for follow-up with your caregiver. This is very important. This includes any referrals and lab tests. Proper follow up can prevent a stroke or another TIA from occurring. SEEK MEDICAL CARE IF:  You have personality changes.  You have difficulty swallowing.  You are seeing double.  You have dizziness.  You have a fever.  You have skin breakdown. SEEK IMMEDIATE MEDICAL CARE IF:  Any of these symptoms may represent a serious problem that is an emergency. Do not wait to see if the symptoms will go away. Get medical help right away. Call your local emergency services (911 in U.S.). Do not drive yourself to the hospital.  You have sudden weakness or numbness of the face, arm, or leg, especially on one side of the body.  You have sudden trouble walking or difficulty moving arms or legs.  You have sudden confusion.  You have trouble speaking (aphasia) or understanding.  You have sudden trouble seeing in one or both eyes.  You have a loss of balance or coordination.  You have a sudden, severe headache with no known cause.  You have new chest pain or an irregular heartbeat.  You have a partial or total loss of consciousness. MAKE SURE YOU:   Understand these instructions.  Will watch your condition.  Will get help right away if you are not doing well or get worse. Document Released: 04/22/2005 Document Revised: 07/18/2013 Document Reviewed: 10/18/2013 [facility] Patient Information 2015 [place], [place]. This information is not intended to replace advice given to you by your health care provider. Make sure you discuss any questions you have with your health care provider.   Emergency Department Resource Guide 1) Find a Doctor and Pay Out of Pocket Although you won't have to find out who is covered by your insurance plan, it is a good idea to ask around and get recommendations. You will then need to call the office and see if the doctor you have chosen will accept you as a new  patient and what types of options they offer for patients who are self-pay. Some doctors offer discounts or will set up payment plans for their patients who do not have insurance, but you will need to ask so you aren't surprised when you get to your appointment.  2) Contact Your Local Health Department Not all health departments have doctors that can see patients for sick visits, but many do, so it is worth a call to see if yours does. If you don't know where your local health department is, you can check in your phone book. The CDC also has a tool to help you locate your state's health department, and many state websites also have listings of all of their local health departments.  3) Find a Walk-in Clinic If your illness is not likely to be very severe or complicated, you may want to try a walk in clinic. These are popping up all over the country in pharmacies, drugstores, and shopping centers. They're usually staffed by nurse practitioners or physician assistants that have  been trained to treat common illnesses and complaints. They're usually fairly quick and inexpensive. However, if you have serious medical issues or chronic medical problems, these are probably not your best option.  No Primary Care Doctor: - Call Health Connect at  [phone] - they can help you locate a primary care doctor that  accepts your insurance, provides certain services, etc. - Physician Referral Service- [phone]  Chronic Pain Problems: Organization         Address  Phone   Notes  [facility]  [phone] Patients need to be referred by their primary care doctor.   Medication Assistance: Organization         Address  Phone   Notes  [facility] Medication [facility] [address] [place] [place], [place] [address] [phone] --Must be a resident of [facility] -- Must have NO insurance coverage whatsoever (no Medicaid/ Medicare, etc.) -- The pt. MUST have a primary  care doctor that directs their care regularly and follows them in the community   Med[facility]ist  [phone]   [employer]  [phone]    Agencies that provide inexpensive medical care: Organization         Address  Phone   Notes  [facility]  [phone]   [facility]    [phone]   [facility] [address] [place], [place] [address] [phone]   [facility] [address] [place], [place] [phone]   Planned Parenthood    [phone]   [facility]    [phone]   [facility]  [address], [place] Phone:  [phone], Fax:  [phone] Hours of Operation:  9 am - 6 pm, M-F.  Also accepts Medicaid/Medicare and self-pay.  [facility]  [address], [place] Phone: [phone], Fax: [phone]. Hours of Operation:  8:30 am - 5:30 pm, M-F.  Also accepts Medicaid and self-pay.  [facility] [place] [address], [place] Phone: [phone]   [facility] [address] Natasha Bence [place], [place] [phone], Ext. [phone] Mondays & Thursdays: 7-9 AM.  First 15 patients are seen on a first come, first serve basis.    Medicaid-accepting [facility] Providers:  Organization         Address  Phone   Notes  [facility] [address], [place] [phone] Also accepts self-pay patients.  [facility] [address] Laurell Josephs [place], [place]  [phone]   [facility] [address], [place] [phone]   [facility] [address], [place] [phone]   Renaye Rakers [address], [place]   [phone] Only accepts [place] Access [place] patients after they have their name applied to their card.   Self-Pay (no insurance) in [facility]:  Organization         Address  Phone   Notes  Sickle Cell Patients, [facility] [address] [place], [place] [phone]   [facility] [address] [place], [place] [phone]   [facility] [place]  [address],  [phone]   [facility]/Dr. Osei-Bonsu  [address] [place] [address], [place] or  [place], [address], [place] [phone] Phone number for both [facility] and [place] locations is the same.  [facility]gent Medical and [facility] [address], [place] [phone]   [facility] [address] [place] or [address] [phone]   [facility] [address], [place] [phone], phone; [phone], fax Sees patients 1st and 3rd Saturday of every month.  Must not qualify for public or private insurance (i.e. Medicaid, Medicare, [place] Health Choice, Veterans' Benefits)  Household income should be no more than 200% of the poverty level The clinic cannot treat you if you are pregnant or think you are pregnant  Sexually transmitted diseases are not treated at the clinic.    Dental Care: Organization         Address  Phone  Notes  [facility] Department of [facility] [phone] Accepts children up to age 34 who are enrolled in [place] or [place]; pregnant women with a Medicaid card; and children who have applied for Medicaid or [place] Health Choice, but were declined, whose parents can pay a reduced fee at time of service.  [facility] Department of [facility]  [address], [place] [phone] Accepts children up to age 7 who are enrolled in [place] or [place]; pregnant women with a Medicaid card; and children who have applied for Medicaid or [place] Health Choice, but were declined, whose parents can  pay a reduced fee at time of service.  [facility]  [place] [phone] Patients are seen by appointment only. Walk-ins are not accepted. [place] will see patients 62 years of age and older. Monday - Tuesday (8am-5pm) Most Wednesdays (8:30-5pm) $30 per visit, cash only  [facility] Adult Dental Access PROGRAM  [address], [facility] [phone] Patients are seen by appointment only. Walk-ins are not accepted. [place] will see patients 62 years of age and older. One Wednesday Evening (Monthly: Volunteer Based).  $30 per visit, cash only  [place]  [phone] for adults; Children under age 22, call Graduate Pediatric Dentistry at [phone]. Children aged 48-14, please call [phone] to request a pediatric application.  Dental services are provided in all areas of dental care including fillings, crowns and bridges, complete and partial dentures, implants, gum treatment, root canals, and extractions. Preventive care is also provided. Treatment is provided to both adults and children. Patients are selected via a lottery and there is often a waiting list.   [facility] [address], [place]  [phone] [email or web address]   [facility] [address] [place], [place] [phone], Ext. [phone] Second and Fourth Thursday of each month, opens at 6:30 AM; Clinic ends at 9 AM.  Patients are seen on a first-come first-served basis, and a limited number are seen during each clinic.   [facility]  [address] Hillard Danker [place], [place] [phone]   Eligibility Requirements You must have lived in [place], [place], or [place] counties for at least the last three months.   You cannot be eligible for state or federal sponsored [employer], including [employer], [place], or [employer].   You generally cannot be eligible for healthcare  insurance through your employer.    How to apply: Eligibility screenings  are held every Tuesday and Wednesday afternoon from 1:00 pm until 4:00 pm. You do not need an appointment for the interview!  [facility] [address] [place], [place]   [place]  [place] Department  [place]  [phone]    Behavioral Health Resources in the Community: Intensive Outpatient Programs Organization         Address  Phone  Notes  [facility] [place]. [address] [place], [place] [phone]   [facility] Outpatient [address], [place], [place]   ADS: Alcohol & Drug Svcs [address], [place], [place]   [place] [address]  [place], [place] or [phone]   Substance Abuse Resources Organization         Address  Phone  Notes  Alcohol and Drug Services  [phone]   [place]  [phone]   [facility] [place]   Chinita Pester  [phone]   Residential & Outpatient Substance Abuse Program  [phone]   Psychological Services Organization         Address  Phone  Notes  [facility]  [place]  [phone]   [place] [address], [place] or [phone]    Mobile Crisis Teams Organization         Address  Phone  Notes  [facility], Mobile Crisis Care Unit  [phone]   [facility]  [address] [place], [place]   [place] Levels [address] [place] [phone]    Self-Help/Support Groups Organization         Address  Phone             Notes  [place] [place] - variety of support groups  [place] Call for more information  Narcotics Anonymous (NA),  [facility] [address], [employer] [place]  2 meetings at this location   Special educational needs teacher         Address  Phone  Notes  [facility]AP Residential Treatment [place],    [place]  [phone]   [facility]  [address] [place] [phone], [place], [place]   [place], [place] [phone] Admissions: 8am-3pm M-F  Incentives Substance [place] [address]    [place], [place] [phone]   [facility] [address] [place], [place], [place]   [facility] [address]  [place], [place]   [facility] - Intensive Outpatient [address], [place], [place]   [facility] ([place] [place].,  [place], [place] [phone] or [phone]   [facility] (RT[facility]) [address], [place], [place] Accepts Medicaid  [facility] [address],  [place] [phone] Substance Abuse/Addiction Treatment   [facility] Organization         Address  Phone  Notes  [facility]  [phone]   Domenic Schwab, PhD [address] Arlis Porta [place], [place]   [phone] or [phone]   [place]   [address] [place], [place] [phone]   [facility] [address] [place], [place] [phone] Insurance/Medicaid/sponsorship  through [facility]  Faith and Families [address]                                    [place], [place] [phone]) [phone] Therapy/tele-psych/case  [facility] [address]   [place], [place] [phone]) [phone]    Dr. Lolly MustacheArfeen  [phone]) [phone]   [facility] [place]  [employer] [facility] 1) [address], [place] 2) [address], [place] 3)  [address], [place] [phone]) [phone]) [phone]  [phone]) [phone]    [facility] Child Abuse Hotline [phone]) [phone] or [phone]) [phone] (After Hours)

## 2014-10-01 NOTE — ED Notes (Signed)
Pt arrived to ED POV with husband and c/o dizziness and right arm heaviness. Symptoms started Tuesday 3/1 and became worse Thursday 3/3 and [facility]. Pt was observed over night and had an MRI, CT and checked arteries in neck. Everything turned out fine but symptoms continue to come and go. [facility] and started on new medication Lisinopril-HCTZ, and ASA 81mg . When symptoms come on it last for a few mins and then goes away, happens multiple times a day.

## 2014-10-01 NOTE — ED Provider Notes (Signed)
CSN: [id number]     Arrival date & time 10/01/14  1011 History   First MD Initiated Contact with Patient 10/01/14 1024     Chief Complaint  Patient presents with  . Dizziness  . Extremity Weakness   Shawna Waters is a 59 y.o. female with a history of hypertension who presents to the ED complaining of continued intermittent right arm heaviness and lightheadedness for the past seven days. The patient was seen and admitted for these symptoms on 09/27/2014 and diagnosed with TIAs. She had a negative CT scan, and also had a MRI and echo during admission. She was stared on aspirin 81 mg and lisinopril. She returns to the ED today because her symptoms have persisted and she was unable to contact the physician she was to follow-up with. She reports not having neurology follow up. She reports having 6-7 episodes of right arm heaviness and lightheadedness a day. Each episode lasts around 3-4 minutes. She returns to base line after. She denies new symptoms today. Patient denies fevers, chills, numbness, tingling, headache, changes to her vision, ear pain, eye pain, sore throat, rashes, or changes to her balance.   (Consider location/radiation/quality/duration/timing/severity/associated sxs/prior Treatment) HPI  Past Medical History  Diagnosis Date  . Hypertension    Past Surgical History  Procedure Laterality Date  . Abdominal hysterectomy     No family history on file. History  Substance Use Topics  . Smoking status: Never Smoker   . Smokeless tobacco: Not on file  . Alcohol Use: No   OB History    No data available     Review of Systems  Constitutional: Negative for fever and chills.  HENT: Negative for congestion, facial swelling, hearing loss, sore throat and tinnitus.   Eyes: Negative for pain and visual disturbance.  Respiratory: Negative for cough, shortness of breath and wheezing.   Cardiovascular: Negative for chest pain and palpitations.  Gastrointestinal: Negative for nausea,  vomiting, abdominal pain and diarrhea.  Genitourinary: Negative for dysuria.  Musculoskeletal: Negative for back pain and neck pain.  Skin: Negative for rash.  Neurological: Positive for light-headedness. Negative for seizures, syncope, facial asymmetry, speech difficulty, weakness, numbness and headaches.      Allergies  Codeine  Home Medications   Prior to Admission medications   Medication Sig Start Date End Date Taking? Authorizing Provider  acetaminophen (TYLENOL) 500 MG tablet Take 1,000 mg by mouth every 6 (six) hours as needed for headache (headache & sinuses).   Yes Historical Provider, MD  aspirin EC 81 MG tablet Take 1 tablet (81 mg total) by mouth daily. 09/28/14  Yes Dorothea Ogle, MD  lisinopril-hydrochlorothiazide (PRINZIDE,ZESTORETIC) 20-12.5 MG per tablet Take 1 tablet by mouth daily. 09/28/14  Yes Dorothea Ogle, MD   BP 151/87 mmHg  Pulse 90  Temp(Src) 98.6 F (37 C) (Oral)  Resp 18  Ht  (1.549 m)  Wt 220 lb (99.791 kg)  BMI 41.59 kg/m2  SpO2 96% Physical Exam  Constitutional: She is oriented to person, place, and time. She appears well-developed and well-nourished. No distress.  Non toxic appearing.   HENT:  Head: Normocephalic and atraumatic.  Right Ear: External ear normal.  Left Ear: External ear normal.  Nose: Nose normal.  Mouth/Throat: Oropharynx is clear and moist. No oropharyngeal exudate.  Eyes: Conjunctivae and EOM are normal. Pupils are equal, round, and reactive to light. Right eye exhibits no discharge. Left eye exhibits no discharge.  Neck: Normal range of motion. Neck supple. No  JVD present.  Cardiovascular: Normal rate, regular rhythm, normal heart sounds and intact distal pulses.  Exam reveals no gallop and no friction rub.   No murmur heard. Bilateral radial pulses are intact.   Pulmonary/Chest: Effort normal and breath sounds normal. No respiratory distress. She has no wheezes. She has no rales.  Abdominal: Soft. She exhibits no  distension. There is no tenderness.  Musculoskeletal: She exhibits no edema.  Lymphadenopathy:    She has no cervical adenopathy.  Neurological: She is alert and oriented to person, place, and time. No cranial nerve deficit. Coordination normal.  Cranial nerves intact. No pronator drift. Visual fields intact. EOMs intact. Good and equal grip strength bilaterally. Strength is 5/5 in her bilateral upper and lower extremities. Heel to shin intact bilaterally. Sensation intact in her bilateral face, arms and lower extremities.   Skin: Skin is warm and dry. No rash noted. She is not diaphoretic. No erythema. No pallor.  Psychiatric: She has a normal mood and affect. Her behavior is normal.  Nursing note and vitals reviewed.   ED Course  Procedures (including critical care time) Labs Review Labs Reviewed - No data to display  Imaging Review No results found.   EKG Interpretation   Date/Time:  Monday October 01 2014 10:18:44 EST Ventricular Rate:  91 PR Interval:  149 QRS Duration: 77 QT Interval:  363 QTC Calculation: 447 R Axis:   60 Text Interpretation:  Sinus rhythm Low voltage, precordial leads Consider  anterior infarct since last tracing no significant change Confirmed by  MILLER  MD, BRIAN ([id number]) on 10/01/2014 10:24:55 AM      Filed Vitals:   10/01/14 1035 10/01/14 1036 10/01/14 1045 10/01/14 1115  BP: 135/78  129/86 151/87  Pulse: 80  85 90  Temp:  98.6 F (37 C)    TempSrc:      Resp: 15  18 18   Height:      Weight:      SpO2: 98%  99% 96%     MDM    Final diagnoses:  Intermittent lightheadedness  Transient cerebral ischemia, unspecified transient cerebral ischemia type    Shawna Waters is a 59 y.o. female with a history of hypertension who presents to the ED complaining of continued intermittent right arm heaviness and lightheadedness for the past seven days. The patient was seen and admitted for these symptoms on 09/27/2014 and diagnosed with TIAs. She had a  negative CT scan, and also had a MRI and echo during admission. She was stared on aspirin 81 mg and lisinopril.  She returns to the emergency department today reporting ongoing symptoms but no changes to her symptoms. She also did not get neurology follow up after her admission. She reports she has intermittent right arm heaviness and lightheadedness that lasts between 3 and 5 minutes and occurs 6-7 times a day. She reports between these episodes she returns to baseline. She denies any changes to her symptoms since they first started and since her admission. On my exam the patient has no focal neuro deficits. She is afebrile nontoxic appearing. Her blood pressure has improved with her most recent being 129/86.  I discussed this patient with neuro hospitalist Dr. Cyril Mourningamillo. He would like this patient to follow-up in office with neurologist Dr. Marjory LiesPenumalli. He would like the patient to continue on ASA 81 mg and lisinopril. He did not think she needed to be on additional anticoagulation.  The patient also has not obtained a PCP, despite having insurance. I  encouraged her to obtain one and provided her with resources for her to be able to to do this. I also stressed the importance of having a good primary care provider. Encouraged her to follow-up with a neurologist as soon as possible. I advised her to return to the emergency department with new or worsening symptoms or new concerns. Patient verbalized understanding and agreement with plan.  This patient was discussed with and evaluated by Dr. Hyacinth Meeker who agrees with assessment and plan.    Everlene Farrier, PA-C 10/01/14 1711  Eber Hong, MD 10/01/14 2035

## 2014-10-02 ENCOUNTER — Encounter (HOSPITAL_COMMUNITY): Payer: Self-pay | Admitting: Emergency Medicine

## 2014-10-02 DIAGNOSIS — Z6841 Body Mass Index (BMI) 40.0 and over, adult: Secondary | ICD-10-CM

## 2014-10-02 DIAGNOSIS — I10 Essential (primary) hypertension: Secondary | ICD-10-CM | POA: Diagnosis not present

## 2014-10-02 DIAGNOSIS — Z79899 Other long term (current) drug therapy: Secondary | ICD-10-CM

## 2014-10-02 DIAGNOSIS — G8191 Hemiplegia, unspecified affecting right dominant side: Secondary | ICD-10-CM | POA: Diagnosis present

## 2014-10-02 DIAGNOSIS — Z90710 Acquired absence of both cervix and uterus: Secondary | ICD-10-CM

## 2014-10-02 DIAGNOSIS — Z7982 Long term (current) use of aspirin: Secondary | ICD-10-CM

## 2014-10-02 DIAGNOSIS — G459 Transient cerebral ischemic attack, unspecified: Secondary | ICD-10-CM | POA: Diagnosis not present

## 2014-10-02 DIAGNOSIS — I63532 Cerebral infarction due to unspecified occlusion or stenosis of left posterior cerebral artery: Secondary | ICD-10-CM | POA: Diagnosis not present

## 2014-10-02 DIAGNOSIS — Z885 Allergy status to narcotic agent status: Secondary | ICD-10-CM

## 2014-10-02 DIAGNOSIS — E785 Hyperlipidemia, unspecified: Secondary | ICD-10-CM | POA: Diagnosis present

## 2014-10-02 DIAGNOSIS — I6350 Cerebral infarction due to unspecified occlusion or stenosis of unspecified cerebral artery: Secondary | ICD-10-CM | POA: Diagnosis not present

## 2014-10-02 LAB — CBC WITH DIFFERENTIAL/PLATELET
BASOS ABS: 0 10*3/uL (ref 0.0–0.1)
Basophils Relative: 0 % (ref 0–1)
Eosinophils Absolute: 0.1 10*3/uL (ref 0.0–0.7)
Eosinophils Relative: 1 % (ref 0–5)
HCT: 43.9 % (ref 36.0–46.0)
Hemoglobin: 15 g/dL (ref 12.0–15.0)
Lymphocytes Relative: 31 % (ref 12–46)
Lymphs Abs: 2.9 10*3/uL (ref 0.7–4.0)
MCH: 32.2 pg (ref 26.0–34.0)
MCHC: 34.2 g/dL (ref 30.0–36.0)
MCV: 94.2 fL (ref 78.0–100.0)
MONO ABS: 0.8 10*3/uL (ref 0.1–1.0)
Monocytes Relative: 9 % (ref 3–12)
Neutro Abs: 5.5 10*3/uL (ref 1.7–7.7)
Neutrophils Relative %: 59 % (ref 43–77)
Platelets: 343 10*3/uL (ref 150–400)
RBC: 4.66 MIL/uL (ref 3.87–5.11)
RDW: 12.6 % (ref 11.5–15.5)
WBC: 9.3 10*3/uL (ref 4.0–10.5)

## 2014-10-02 LAB — I-STAT CHEM 8, ED
BUN: 21 mg/dL (ref 6–23)
CALCIUM ION: 1.09 mmol/L — AB (ref 1.12–1.23)
CHLORIDE: 100 mmol/L (ref 96–112)
CREATININE: 1 mg/dL (ref 0.50–1.10)
GLUCOSE: 93 mg/dL (ref 70–99)
HCT: 47 % — ABNORMAL HIGH (ref 36.0–46.0)
Hemoglobin: 16 g/dL — ABNORMAL HIGH (ref 12.0–15.0)
POTASSIUM: 3.9 mmol/L (ref 3.5–5.1)
Sodium: 138 mmol/L (ref 135–145)
TCO2: 23 mmol/L (ref 0–100)

## 2014-10-02 NOTE — ED Notes (Signed)
Pt st's she had a episode this am with dizziness and right arm feeling heavy.  Pt st's tonight she started having numbness in her head.  Pt st's she keeps having episodes.   In triage pt st's she is passing out and closes her eyes but will answer when spoken to.

## 2014-10-03 ENCOUNTER — Inpatient Hospital Stay (HOSPITAL_COMMUNITY): Payer: BC Managed Care – PPO

## 2014-10-03 ENCOUNTER — Emergency Department (HOSPITAL_COMMUNITY): Payer: BC Managed Care – PPO

## 2014-10-03 ENCOUNTER — Inpatient Hospital Stay (HOSPITAL_COMMUNITY)
Admission: EM | Admit: 2014-10-03 | Discharge: 2014-10-04 | DRG: 065 | Disposition: A | Discharge status: Home / Self Care | Payer: BC Managed Care – PPO | Attending: Internal Medicine | Admitting: Internal Medicine

## 2014-10-03 ENCOUNTER — Telehealth: Payer: Self-pay

## 2014-10-03 ENCOUNTER — Encounter (HOSPITAL_COMMUNITY): Payer: Self-pay

## 2014-10-03 DIAGNOSIS — Z79899 Other long term (current) drug therapy: Secondary | ICD-10-CM | POA: Diagnosis not present

## 2014-10-03 DIAGNOSIS — G8191 Hemiplegia, unspecified affecting right dominant side: Secondary | ICD-10-CM | POA: Diagnosis present

## 2014-10-03 DIAGNOSIS — I639 Cerebral infarction, unspecified: Secondary | ICD-10-CM | POA: Diagnosis present

## 2014-10-03 DIAGNOSIS — E785 Hyperlipidemia, unspecified: Secondary | ICD-10-CM | POA: Diagnosis present

## 2014-10-03 DIAGNOSIS — I10 Essential (primary) hypertension: Secondary | ICD-10-CM | POA: Diagnosis present

## 2014-10-03 DIAGNOSIS — Z885 Allergy status to narcotic agent status: Secondary | ICD-10-CM | POA: Diagnosis not present

## 2014-10-03 DIAGNOSIS — Z6841 Body Mass Index (BMI) 40.0 and over, adult: Secondary | ICD-10-CM | POA: Diagnosis not present

## 2014-10-03 DIAGNOSIS — I6350 Cerebral infarction due to unspecified occlusion or stenosis of unspecified cerebral artery: Secondary | ICD-10-CM | POA: Diagnosis present

## 2014-10-03 DIAGNOSIS — I63532 Cerebral infarction due to unspecified occlusion or stenosis of left posterior cerebral artery: Secondary | ICD-10-CM | POA: Diagnosis present

## 2014-10-03 DIAGNOSIS — Z90710 Acquired absence of both cervix and uterus: Secondary | ICD-10-CM | POA: Diagnosis not present

## 2014-10-03 DIAGNOSIS — G459 Transient cerebral ischemic attack, unspecified: Secondary | ICD-10-CM | POA: Diagnosis not present

## 2014-10-03 DIAGNOSIS — Z7982 Long term (current) use of aspirin: Secondary | ICD-10-CM | POA: Diagnosis not present

## 2014-10-03 DIAGNOSIS — Z8673 Personal history of transient ischemic attack (TIA), and cerebral infarction without residual deficits: Secondary | ICD-10-CM | POA: Insufficient documentation

## 2014-10-03 LAB — I-STAT CHEM 8, ED
BUN: 20 mg/dL (ref 6–23)
CALCIUM ION: 1.16 mmol/L (ref 1.12–1.23)
Chloride: 102 mmol/L (ref 96–112)
Creatinine, Ser: 1.1 mg/dL (ref 0.50–1.10)
GLUCOSE: 110 mg/dL — AB (ref 70–99)
HEMATOCRIT: 47 % — AB (ref 36.0–46.0)
Hemoglobin: 16 g/dL — ABNORMAL HIGH (ref 12.0–15.0)
Potassium: 3.8 mmol/L (ref 3.5–5.1)
SODIUM: 139 mmol/L (ref 135–145)
TCO2: 22 mmol/L (ref 0–100)

## 2014-10-03 LAB — LIPID PANEL
Cholesterol: 198 mg/dL (ref 0–200)
HDL: 38 mg/dL — ABNORMAL LOW (ref >39)
LDL Cholesterol: 139 mg/dL — ABNORMAL HIGH (ref 0–99)
TRIGLYCERIDES: 106 mg/dL (ref <150)
Total CHOL/HDL Ratio: 5.2 RATIO
VLDL: 21 mg/dL (ref 0–40)

## 2014-10-03 LAB — CBC WITH DIFFERENTIAL/PLATELET
BASOS PCT: 0 % (ref 0–1)
Basophils Absolute: 0 10*3/uL (ref 0.0–0.1)
Eosinophils Absolute: 0 10*3/uL (ref 0.0–0.7)
Eosinophils Relative: 1 % (ref 0–5)
HCT: 42.7 % (ref 36.0–46.0)
HEMOGLOBIN: 14.2 g/dL (ref 12.0–15.0)
LYMPHS ABS: 2 10*3/uL (ref 0.7–4.0)
Lymphocytes Relative: 23 % (ref 12–46)
MCH: 31.6 pg (ref 26.0–34.0)
MCHC: 33.3 g/dL (ref 30.0–36.0)
MCV: 94.9 fL (ref 78.0–100.0)
Monocytes Absolute: 0.7 10*3/uL (ref 0.1–1.0)
Monocytes Relative: 8 % (ref 3–12)
NEUTROS ABS: 5.9 10*3/uL (ref 1.7–7.7)
NEUTROS PCT: 68 % (ref 43–77)
Platelets: 332 10*3/uL (ref 150–400)
RBC: 4.5 MIL/uL (ref 3.87–5.11)
RDW: 12.5 % (ref 11.5–15.5)
WBC: 8.6 10*3/uL (ref 4.0–10.5)

## 2014-10-03 LAB — COMPREHENSIVE METABOLIC PANEL
ALT: 18 U/L (ref 0–35)
ANION GAP: 13 (ref 5–15)
AST: 23 U/L (ref 0–37)
Albumin: 3.9 g/dL (ref 3.5–5.2)
Alkaline Phosphatase: 77 U/L (ref 39–117)
BUN: 15 mg/dL (ref 6–23)
CHLORIDE: 108 mmol/L (ref 96–112)
CO2: 18 mmol/L — AB (ref 19–32)
Calcium: 9.3 mg/dL (ref 8.4–10.5)
Creatinine, Ser: 0.93 mg/dL (ref 0.50–1.10)
GFR calc Af Amer: 78 mL/min — ABNORMAL LOW (ref >90)
GFR, EST NON AFRICAN AMERICAN: 67 mL/min — AB (ref >90)
Glucose, Bld: 96 mg/dL (ref 70–99)
POTASSIUM: 4 mmol/L (ref 3.5–5.1)
Sodium: 139 mmol/L (ref 135–145)
Total Bilirubin: 1 mg/dL (ref 0.3–1.2)
Total Protein: 7.7 g/dL (ref 6.0–8.3)

## 2014-10-03 LAB — PROTIME-INR
INR: 1 (ref 0.00–1.49)
Prothrombin Time: 13.3 seconds (ref 11.6–15.2)

## 2014-10-03 LAB — TROPONIN I: Troponin I: <0.03 ng/mL (ref <0.031)

## 2014-10-03 LAB — APTT: aPTT: 30 seconds (ref 24–37)

## 2014-10-03 MED ORDER — CLOPIDOGREL BISULFATE 75 MG PO TABS
75.0000 mg | ORAL_TABLET | Freq: Every day | ORAL | Status: DC
  Administered 2014-10-03 – 2014-10-04 (×2): 75 mg via ORAL
  Filled 2014-10-03 (×2): qty 1

## 2014-10-03 MED ORDER — ASPIRIN 325 MG PO TABS
325.0000 mg | ORAL_TABLET | Freq: Every day | ORAL | Status: DC
  Administered 2014-10-03: 325 mg via ORAL
  Filled 2014-10-03: qty 1

## 2014-10-03 MED ORDER — IOHEXOL 350 MG/ML SOLN
50.0000 mL | Freq: Once | INTRAVENOUS | Status: AC | PRN
  Administered 2014-10-03: 50 mL via INTRAVENOUS

## 2014-10-03 MED ORDER — ASPIRIN 300 MG RE SUPP: 300.0000 mg | Freq: Every day | RECTAL | Status: DC

## 2014-10-03 MED ORDER — STROKE: EARLY STAGES OF RECOVERY BOOK
Freq: Once | Status: AC
  Administered 2014-10-03: 05:00:00

## 2014-10-03 MED ORDER — SENNOSIDES-DOCUSATE SODIUM 8.6-50 MG PO TABS: 1.0000 | ORAL_TABLET | Freq: Every evening | ORAL | Status: DC | PRN

## 2014-10-03 MED ORDER — ATORVASTATIN CALCIUM 10 MG PO TABS
20.0000 mg | ORAL_TABLET | Freq: Every day | ORAL | Status: DC
  Administered 2014-10-03 – 2014-10-04 (×2): 20 mg via ORAL
  Filled 2014-10-03 (×3): qty 2

## 2014-10-03 MED ORDER — SODIUM CHLORIDE 0.9 % IV SOLN
INTRAVENOUS | Status: AC
  Administered 2014-10-03: 05:00:00 via INTRAVENOUS

## 2014-10-03 MED ORDER — HYDRALAZINE HCL 20 MG/ML IJ SOLN: 10.0000 mg | INTRAMUSCULAR | Status: DC | PRN

## 2014-10-03 MED ORDER — ENOXAPARIN SODIUM 40 MG/0.4ML ~~LOC~~ SOLN
40.0000 mg | Freq: Every day | SUBCUTANEOUS | Status: DC
  Administered 2014-10-03 – 2014-10-04 (×2): 40 mg via SUBCUTANEOUS
  Filled 2014-10-03 (×2): qty 0.4

## 2014-10-03 MED ORDER — LORAZEPAM 0.5 MG PO TABS
0.5000 mg | ORAL_TABLET | Freq: Three times a day (TID) | ORAL | Status: DC | PRN
  Administered 2014-10-03 – 2014-10-04 (×3): 0.5 mg via ORAL
  Filled 2014-10-03 (×3): qty 1

## 2014-10-03 MED ORDER — SODIUM CHLORIDE 0.9 % IV SOLN
INTRAVENOUS | Status: DC
  Administered 2014-10-03: 01:00:00 via INTRAVENOUS

## 2014-10-03 MED ORDER — LISINOPRIL 20 MG PO TABS
20.0000 mg | ORAL_TABLET | Freq: Every day | ORAL | Status: DC
  Administered 2014-10-03 – 2014-10-04 (×2): 20 mg via ORAL
  Filled 2014-10-03 (×2): qty 1

## 2014-10-03 NOTE — Consult Note (Signed)
ELECTROPHYSIOLOGY CONSULT NOTE  Patient ID: Shawna StackVickie B Ryther MRN: [id number], DOB/AGE: May 20, 1956   Admit date: 10/03/2014 Date of Consult: 10/04/2014  Primary Physician: Dow AdolphMCPHERSON,BARBARA, MD [facility] Reason for Consultation: Cryptogenic stroke; recommendations regarding Implantable Loop Recorder  History of Present Illness Markiesha B Burdette was admitted on 10/03/2014 with right sided weakness and numbness.  Imaging demonstrated dominant left PCA territory infarcts.  She has undergone workup for stroke including echocardiogram and carotid dopplers.  The patient has been monitored on telemetry which has demonstrated sinus rhythm with no arrhythmias.  Inpatient stroke work-up is to be completed with a TEE.   Echocardiogram this admission demonstrated EF 60-65%, no RWMA, grade 1 diastolic dysfunction, LA 38.  Lab work is reviewed.  Prior to admission, the patient denies chest pain, shortness of breath, dizziness, palpitations, or syncope.  They are recovering from their stroke with plans to return home at discharge.  EP has been asked to evaluate for placement of an implantable loop recorder to monitor for atrial fibrillation.  ROS is negative except as outlined above.    Past Medical History  Diagnosis Date  . Hypertension      Surgical History:  Past Surgical History  Procedure Laterality Date  . Abdominal hysterectomy       Prescriptions prior to admission  Medication Sig Dispense Refill Last Dose  . aspirin EC 81 MG tablet Take 1 tablet (81 mg total) by mouth daily. 30 tablet 1 10/02/2014 at Unknown time  . lisinopril-hydrochlorothiazide (PRINZIDE,ZESTORETIC) 20-12.5 MG per tablet Take 1 tablet by mouth daily. 30 tablet 1 10/02/2014 at Unknown time    Inpatient Medications:  . atorvastatin  20 mg Oral q1800  . clopidogrel  75 mg Oral Daily  . enoxaparin (LOVENOX) injection  40 mg Subcutaneous Daily  . lisinopril  20 mg Oral Daily    Allergies:    Allergies  Allergen Reactions  . Codeine Other (See Comments)    Husband not aware of reaction.     History   Social History  . Marital Status: Married    Spouse Name: N/A  . Number of Children: N/A  . Years of Education: N/A   Occupational History  . Not on file.   Social History Main Topics  . Smoking status: Never Smoker   . Smokeless tobacco: Not on file  . Alcohol Use: No  . Drug Use: No  . Sexual Activity: Not on file   Other Topics Concern  . Not on file   Social History Narrative     Family History  Problem Relation Age of Onset  . CAD Father      Physical Exam: Filed Vitals:   10/03/14 2105 10/04/14 0146 10/04/14 0618 10/04/14 1006  BP: 151/78 115/70 123/74 134/82  Pulse: 71 64 71 80  Temp: 98.4 F (36.9 C) 98 F (36.7 C) 98 F (36.7 C) 98.2 F (36.8 C)  TempSrc: Oral Oral Oral Oral  Resp: 20 20 20 20   Height:      Weight:      SpO2: 96% 98% 97% 100%    GEN- The patient is well appearing, alert and oriented x 3 today.   Head- normocephalic, atraumatic Eyes-  Sclera clear, conjunctiva pink Ears- hearing intact Oropharynx- clear Neck- supple, Lungs- Clear to ausculation bilaterally, normal work of breathing Heart- Regular rate and rhythm, no murmurs, rubs or gallops  GI- soft, NT, ND, + BS Extremities- no clubbing, cyanosis, or edema MS- no significant  deformity or atrophy Skin- no rash or lesion Psych- anxious, full affect   Labs:   Lab Results  Component Value Date   WBC 8.6 10/03/2014   HGB 14.2 10/03/2014   HCT 42.7 10/03/2014   MCV 94.9 10/03/2014   PLT 332 10/03/2014     Recent Labs Lab 10/03/14 0655  NA 139  K 4.0  CL 108  CO2 18*  BUN 15  CREATININE 0.93  CALCIUM 9.3  PROT 7.7  BILITOT 1.0  ALKPHOS 77  ALT 18  AST 23  GLUCOSE 96  No results found for: DDIMER   Radiology/Studies: Ct Angio Head W/cm &/or Wo Cm 10/03/2014   CLINICAL DATA:  Dizziness and RIGHT arm heaviness beginning September 25, 2014, worse 2 days  later. Intermittent symptoms, multiple episodes in the last day, with LEFT facial numbness.  EXAM: CT ANGIOGRAPHY HEAD  TECHNIQUE: Multidetector CT imaging of the head was performed using the standard protocol during bolus administration of intravenous contrast. Multiplanar CT image reconstructions and MIPs were obtained to evaluate the vascular anatomy.  CONTRAST:  50mL OMNIPAQUE IOHEXOL 350 MG/ML SOLN  COMPARISON:  MRI of the brain September 28, 2014  FINDINGS: CT HEAD  The ventricles and sulci are normal. No intraparenchymal hemorrhage, mass effect nor midline shift. Mild LEFT mesial temporal occipital hypodensity which is new from prior MRI. No abnormal intracranial enhancement.  No abnormal extra-axial fluid collections. Basal cisterns are patent.  No skull fracture. The included ocular globes and orbital contents are non-suspicious. The mastoid aircells and included paranasal sinuses are well-aerated.  CTA HEAD  Anterior circulation: Normal appearance of the cervical internal carotid arteries, petrous, cavernous and supra clinoid internal carotid arteries. Widely patent anterior communicating artery. Normal appearance of the anterior and middle cerebral arteries.  Posterior circulation: RIGHT vertebral artery is dominant with normal appearance of the vertebral arteries, vertebrobasilar junction and basilar artery, as well as main branch vessels. Focal high-grade stenosis of proximal LEFT P2 segment with luminal irregularity, the thready distal to this.  No large vessel occlusion, contrast extravasation or aneurysm within the anterior nor posterior circulation.  IMPRESSION: CT HEAD: Mild LEFT mesial temporal occipital lobe hypodensity concerning for acute infarct, new from September 28, 2014.  CTA HEAD: High-grade stenosis proximal LEFT P2 segment, which may represent embolic disease. No large vessel occlusion.  Acute findings discussed with and reconfirmed by Dr.IVA KNAPP on 10/03/2014 at 2:15 am.   Electronically Signed    By: Awilda Metro   On: 10/03/2014 02:15   Dg Chest 2 View 10/03/2014   CLINICAL DATA:  Cough with right-sided weakness.  EXAM: CHEST  2 VIEW  COMPARISON:  None.  FINDINGS: Lungs are hypoinflated without consolidation or effusion. Cardiomediastinal silhouette is within normal. Remaining bones and soft tissues are within normal.  IMPRESSION: Hypoinflation without acute cardiopulmonary disease.   Electronically Signed   By: Elberta Fortis M.D.   On: 10/03/2014 09:41    12-lead ECG sinus rhythm, rate 91 All prior EKG's in EPIC reviewed with no documented atrial fibrillation  Telemetry sinus rhythm  Assessment and Plan:  1. Cryptogenic stroke The patient presents with cryptogenic stroke.  The patient has a TEE planned for this this afternoon.  I spoke at length with the patient about monitoring for afib with an implantable loop recorder.  Risks, benefits, and alteratives to implantable loop recorder were discussed with the patient today.   At this time, the patient is very clear in their decision to proceed with implantable loop  recorder.   Wound care was reviewed with the patient (keep incision clean and dry for 3 days).  Wound check scheduled for 10-11-14 at 12Noon.   Please call with questions.   Gypsy Balsam, NP 10/04/2014 10:20 AM   I have seen, examined the patient, and reviewed the above assessment and plan.  Changes to above are made where necessary. RRR on exam.  Will proceed with ILR later today if TEE is unremarkable.  Could DC to home later today from EP standpoint after device implant.    Co Sign: Hillis Range, MD 10/04/2014 12:21 PM

## 2014-10-03 NOTE — ED Provider Notes (Signed)
CSN: [id number]     Arrival date & time 10/02/14  2224 History  This chart was scribed for Shawna Albe, MD by Annye Asa, ED Scribe. This patient was seen in room A10C/A10C and the patient's care was started at 12:36 AM.    Chief Complaint  Patient presents with  . Code Stroke   Patient is a 59 y.o. female presenting with dizziness. The history is provided by the patient. No language interpreter was used.  Dizziness Associated symptoms: headaches and weakness   Associated symptoms: no nausea and no vomiting      HPI Comments: Shawna Waters is a 59 y.o. female with no prior past medical problems who presents to the Emergency Department complaining of constant lightheadedness and intermittent episodes of "heaviness" in her right arm, lasting 2-3 minutes at each time with an estimated 5-6 episodes tonight. She notes blurriness in her right eye with these episodes. Husband reports that patient is able to ambulate well ("episodes normally occur while seated") and that her speech is unaltered ("she can hear you, but she won't really say anything"). He also notes several days of decreased appetite; she explains "nothing tastes good."  Husband reports that, around 22:00 tonight, patient began to complain of right arm, leg, and right-sided facial numbness, prompting them to come to the ED. She notes a headache at this time, which began upon her arrival in the ED. She denies nausea, vomiting. She denies familial history of stroke.   Husband explains that she was previously seen in [facility] ED on 09/27/14 for similar symptoms (dizziness, left arm weakness - husband reports that blood pressure at that time was "high - 200s/160s"). A CT was done at that time to rule out stroke (resulted negative). She was admitted and received an MRI and echo (both of which returned normal). She was discharged with Lisinopril HCTZ and ASA ; husband reports that patient has been compliant with these medications since her discharge  on 09/28/14. She had not previously been on BP meds and hadn't had her BP checked for many years.   She was seen here in [facility] ED yesterday and the PA provided a referral to a neurologist; they are scheduled to see Dr. Anne Hahn on 10/04/14. Patient is scheduled to see Dr. Dow Adolph at [facility]gent [facility]  on [address] on 11/03/79; she did not previously have a PCP.   Past Medical History  Diagnosis Date  . Hypertension    Past Surgical History  Procedure Laterality Date  . Abdominal hysterectomy     Family History  Problem Relation Age of Onset  . CAD Father    History  Substance Use Topics  . Smoking status: Never Smoker   . Smokeless tobacco: Not on file  . 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  Eyes: Positive for visual disturbance.  Gastrointestinal: Negative for nausea and vomiting.  Neurological: Positive for dizziness, weakness, numbness and headaches.  All other systems reviewed and are negative.  Allergies  Codeine  Home Medications   Prior to Admission medications   Medication Sig Start Date End Date Taking? Authorizing Provider  acetaminophen (TYLENOL) 500 MG tablet Take 1,000 mg by mouth every 6 (six) hours as needed for headache (headache & sinuses).    Historical Provider, MD  aspirin EC 81 MG tablet Take 1 tablet (81 mg total) by  mouth daily. 09/28/14   Dorothea Ogle, MD  lisinopril-hydrochlorothiazide (PRINZIDE,ZESTORETIC) 20-12.5 MG per tablet Take 1 tablet by mouth daily. 09/28/14   Dorothea Ogle, MD   BP 146/99 mmHg  Pulse 90  Temp(Src) 98 F (36.7 C) (Oral)  Resp 20  Ht  (1.549 m)  Wt 220 lb (99.791 kg)  BMI 41.59 kg/m2  SpO2 99%  Vital signs normal   Physical Exam  Constitutional: She is oriented to person, place, and time. She appears well-developed and well-nourished.  Non-toxic appearance. She does not appear  ill. No distress.  HENT:  Head: Normocephalic and atraumatic.  Right Ear: External ear normal.  Left Ear: External ear normal.  Nose: Nose normal. No mucosal edema or rhinorrhea.  Mouth/Throat: Oropharynx is clear and moist and mucous membranes are normal. No dental abscesses or uvula swelling.  Eyes: Conjunctivae and EOM are normal. Pupils are equal, round, and reactive to light.  Neck: Normal range of motion and full passive range of motion without pain. Neck supple.  Cardiovascular: Normal rate, regular rhythm and normal heart sounds.  Exam reveals no gallop and no friction rub.   No murmur heard. Pulmonary/Chest: Effort normal and breath sounds normal. No respiratory distress. She has no wheezes. She has no rhonchi. She has no rales. She exhibits no tenderness and no crepitus.  Abdominal: Soft. Normal appearance and bowel sounds are normal. She exhibits no distension. There is no tenderness. There is no rebound and no guarding.  Musculoskeletal: Normal range of motion. She exhibits no edema or tenderness.  Moves all extremities well.   Neurological: She is alert and oriented to person, place, and time. She has normal strength. No cranial nerve deficit.  Normal grip strength bilaterally, normal LE strength, normal finger to nose bilaterally, no pronator drift.    Skin: Skin is warm, dry and intact. No rash noted. No erythema. No pallor.  Psychiatric: She has a normal mood and affect. Her speech is normal and behavior is normal. Her mood appears not anxious.  Nursing note and vitals reviewed.   ED Course  Procedures   DIAGNOSTIC STUDIES: Oxygen Saturation is 95% on RA, adequate by my interpretation.    COORDINATION OF CARE: 12:46 AM Discussed treatment plan with pt at bedside and pt agreed to plan.  02:14 Radiology called results of her CTA which is consistent with an acute stroke  Code stroke was called  2:24 AM Discussed test results with patient and family; both verbalized  understanding of results and upcoming treatment plan. Patient explained that she is having an acute stroke and a neurologist will be seeing her shortly in the emergency department.  02:52 Dr Roseanne Reno, neurology, has seen patient and wants hospitalist to admit.  03:43 Dr Toniann Fail admit to tele  Labs Review Results for orders placed or performed during the hospital encounter of 10/03/14  CBC with Differential  Result Value Ref Range   WBC 9.3 4.0 - 10.5 K/uL   RBC 4.66 3.87 - 5.11 MIL/uL   Hemoglobin 15.0 12.0 - 15.0 g/dL   HCT 16.1 09.6 - 04.5 %   MCV 94.2 78.0 - 100.0 fL   MCH 32.2 26.0 - 34.0 pg   MCHC 34.2 30.0 - 36.0 g/dL   RDW 40.9 81.1 - 91.4 %   Platelets 343 150 - 400 K/uL   Neutrophils Relative % 59 43 - 77 %   Neutro Abs 5.5 1.7 - 7.7 K/uL   Lymphocytes Relative 31 12 - 46 %  Lymphs Abs 2.9 0.7 - 4.0 K/uL   Monocytes Relative 9 3 - 12 %   Monocytes Absolute 0.8 0.1 - 1.0 K/uL   Eosinophils Relative 1 0 - 5 %   Eosinophils Absolute 0.1 0.0 - 0.7 K/uL   Basophils Relative 0 0 - 1 %   Basophils Absolute 0.0 0.0 - 0.1 K/uL  Protime-INR  Result Value Ref Range   Prothrombin Time 13.3 11.6 - 15.2 seconds   INR 1.00 0.00 - 1.49  APTT  Result Value Ref Range   aPTT 30 24 - 37 seconds  I-Stat Chem 8, ED  Result Value Ref Range   Sodium 138 135 - 145 mmol/L   Potassium 3.9 3.5 - 5.1 mmol/L   Chloride 100 96 - 112 mmol/L   BUN 21 6 - 23 mg/dL   Creatinine, Ser 1.61 0.50 - 1.10 mg/dL   Glucose, Bld 93 70 - 99 mg/dL   Calcium, Ion 0.96 (L) 1.12 - 1.23 mmol/L   TCO2 23 0 - 100 mmol/L   Hemoglobin 16.0 (H) 12.0 - 15.0 g/dL   HCT 04.5 (H) 40.9 - 81.1 %  I-stat Chem 8, ED  Result Value Ref Range   Sodium 139 135 - 145 mmol/L   Potassium 3.8 3.5 - 5.1 mmol/L   Chloride 102 96 - 112 mmol/L   BUN 20 6 - 23 mg/dL   Creatinine, Ser 9.14 0.50 - 1.10 mg/dL   Glucose, Bld 782 (H) 70 - 99 mg/dL   Calcium, Ion 9.56 2.13 - 1.23 mmol/L   TCO2 22 0 - 100 mmol/L   Hemoglobin  16.0 (H) 12.0 - 15.0 g/dL   HCT 08.6 (H) 57.8 - 46.9 %   Laboratory interpretation all normal    Imaging Review Ct Angio Head W/cm &/or Wo Cm  10/03/2014   CLINICAL DATA:  Dizziness and RIGHT arm heaviness beginning September 25, 2014, worse 2 days later. Intermittent symptoms, multiple episodes in the last day, with LEFT facial numbness.  EXAM: CT ANGIOGRAPHY HEAD  TECHNIQUE: Multidetector CT imaging of the head was performed using the standard protocol during bolus administration of intravenous contrast. Multiplanar CT image reconstructions and MIPs were obtained to evaluate the vascular anatomy.  CONTRAST:  50mL OMNIPAQUE IOHEXOL 350 MG/ML SOLN  COMPARISON:  MRI of the brain September 28, 2014  FINDINGS: CT HEAD  The ventricles and sulci are normal. No intraparenchymal hemorrhage, mass effect nor midline shift. Mild LEFT mesial temporal occipital hypodensity which is new from prior MRI. No abnormal intracranial enhancement.  No abnormal extra-axial fluid collections. Basal cisterns are patent.  No skull fracture. The included ocular globes and orbital contents are non-suspicious. The mastoid aircells and included paranasal sinuses are well-aerated.  CTA HEAD  Anterior circulation: Normal appearance of the cervical internal carotid arteries, petrous, cavernous and supra clinoid internal carotid arteries. Widely patent anterior communicating artery. Normal appearance of the anterior and middle cerebral arteries.  Posterior circulation: RIGHT vertebral artery is dominant with normal appearance of the vertebral arteries, vertebrobasilar junction and basilar artery, as well as main branch vessels. Focal high-grade stenosis of proximal LEFT P2 segment with luminal irregularity, the thready distal to this.  No large vessel occlusion, contrast extravasation or aneurysm within the anterior nor posterior circulation.  IMPRESSION: CT HEAD: Mild LEFT mesial temporal occipital lobe hypodensity concerning for acute infarct, new  from September 28, 2014.  CTA HEAD: High-grade stenosis proximal LEFT P2 segment, which may represent embolic disease. No large vessel occlusion.  Acute  findings discussed with and reconfirmed by Dr.Millenia Waldvogel on 10/03/2014 at 2:15 am.   Electronically Signed   By: Awilda Metroourtnay  Bloomer   On: 10/03/2014 02:15     EKG Interpretation None      Date: 10/03/2014  Rate: 84  Rhythm: normal sinus rhythm  QRS Axis: normal  Intervals: normal  ST/T Wave abnormalities: normal  Conduction Disutrbances:none  Narrative Interpretation: PRWP  Old EKG Reviewed: none available  MDM   Final diagnoses:  Acute ischemic stroke    Plan admission  CRITICAL CARE Performed by: Leisha Trinkle L Total critical care time: 31 min Critical care time was exclusive of separately billable procedures and treating other patients. Critical care was necessary to treat or prevent imminent or life-threatening deterioration. Critical care was time spent personally by me on the following activities: development of treatment plan with patient and/or surrogate as well as nursing, discussions with consultants, evaluation of patient's response to treatment, examination of patient, obtaining history from patient or surrogate, ordering and performing treatments and interventions, ordering and review of laboratory studies, ordering and review of radiographic studies, pulse oximetry and re-evaluation of patient's condition.   Shawna AlbeIva Damico Partin, MD, Concha PyoFACEP      Winton Offord, MD 10/03/14 [phone]

## 2014-10-03 NOTE — Progress Notes (Signed)
STROKE TEAM PROGRESS NOTE   HISTORY Shawna Waters is an 59 y.o. female history of hypertension as well as admission on 09/27/2014 for TIA workup with similar right side weakness and numbness. 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 She was seen again in the emergency room with similar symptoms on 10/01/2014. No focal deficits were noted. Patient was told to continue aspirin and follow-up appointment with [facility] was made. She is continue to take aspirin 81 mg per day. She had several episodes of right-sided numbness involving ascending arm and subsequently involving right leg. Last episode of side numbness started at 10:15 PM and has improved. CT scan of her head findings indicative of possible acute left medial parieto-occipital infarction. CTA was unremarkable except for high-grade left PCA P2 segment. Patient is being admitted for further evaluation to rule out acute stroke. NIH stroke score was 0. She was LSN: 10:15 PM on 10/02/2014. Patient was not administered TPA secondary to Resolving deficits. She was admitted for further evaluation and treatment.   SUBJECTIVE (INTERVAL HISTORY) Her husband and multiple family members are at the bedside.  She does not think she ever totally got back to normal from her first episode. Husband reports "spells" are sporadic, but increasing in frequency, up to 5 mins in duration. Overall she feels her condition is completely resolved.    OBJECTIVE Temp:  [97.9 F (36.6 C)-98.2 F (36.8 C)] 98.2 F (36.8 C) (03/09 1041) Pulse Rate:  [79-104] 86 (03/09 1041) Cardiac Rhythm:  [-] Normal sinus rhythm (03/09 0755) Resp:  [16-22] 18 (03/09 1041) BP: (117-152)/(71-99) 137/73 mmHg (03/09 1041) SpO2:  [95 %-100 %] 100 % (03/09 1041) Weight:  [99.791 kg (220 lb)-100.336 kg (221 lb 3.2 oz)] 100.336 kg (221 lb 3.2 oz) (03/09 0444)   Recent Labs Lab 09/27/14 2000 09/27/14 2339 09/28/14 0747   GLUCAP 75 131* 96    Recent Labs Lab 09/27/14 2011 09/28/14 0516 10/02/14 2257 10/03/14 0129  NA 139 139 138 139  K 4.2 3.8 3.9 3.8  CL 107 107 100 102  CO2 26 23  --   --   GLUCOSE 108* 102* 93 110*  BUN CREATININE 0.99 0.89 1.00 1.10  CALCIUM 9.3 8.7  --   --     Recent Labs Lab 09/28/14 0516  AST 24  ALT 22  ALKPHOS 74  BILITOT 0.4  PROT 6.7  ALBUMIN 3.8    Recent Labs Lab 09/27/14 2011 09/28/14 0516 10/02/14 2248 10/02/14 2257 10/03/14 0129 10/03/14 0655  WBC 8.3 7.3 9.3  --   --  8.6  NEUTROABS  --   --  5.5  --   --  5.9  HGB 13.8 12.4 15.0 16.0* 16.0* 14.2  HCT 40.6 38.5 43.9 47.0* 47.0* 42.7  MCV 94.9 95.8 94.2  --   --  94.9  PLT 319 321 343  --   --  332    Recent Labs Lab 10/03/14 0102  TROPONINI <0.03    Recent Labs  10/03/14 0102  LABPROT 13.3  INR 1.00   No results for input(s): COLORURINE, LABSPEC, PHURINE, GLUCOSEU, HGBUR, BILIRUBINUR, KETONESUR, PROTEINUR, UROBILINOGEN, NITRITE, LEUKOCYTESUR in the last 72 hours.  Invalid input(s): APPERANCEUR     Component Value Date/Time   CHOL 198 10/03/2014 0655   TRIG 106 10/03/2014 0655   HDL 38* 10/03/2014 0655   CHOLHDL 5.2 10/03/2014 0655   VLDL 21 10/03/2014 1610  LDLCALC 139* 10/03/2014 0655   Lab Results  Component Value Date   HGBA1C 5.6 09/28/2014      Component Value Date/Time   LABOPIA NONE DETECTED 09/27/2014 2250   COCAINSCRNUR NONE DETECTED 09/27/2014 2250   LABBENZ NONE DETECTED 09/27/2014 2250   AMPHETMU NONE DETECTED 09/27/2014 2250   THCU NONE DETECTED 09/27/2014 2250   LABBARB NONE DETECTED 09/27/2014 2250    No results for input(s): ETH in the last 168 hours.  Ct Angio Head W/cm &/or Wo Cm  10/03/2014   CLINICAL DATA:  Dizziness and RIGHT arm heaviness beginning September 25, 2014, worse 2 days later. Intermittent symptoms, multiple episodes in the last day, with LEFT facial numbness.  EXAM: CT ANGIOGRAPHY HEAD  TECHNIQUE: Multidetector CT imaging  of the head was performed using the standard protocol during bolus administration of intravenous contrast. Multiplanar CT image reconstructions and MIPs were obtained to evaluate the vascular anatomy.  CONTRAST:  50mL OMNIPAQUE IOHEXOL 350 MG/ML SOLN  COMPARISON:  MRI of the brain September 28, 2014  FINDINGS: CT HEAD  The ventricles and sulci are normal. No intraparenchymal hemorrhage, mass effect nor midline shift. Mild LEFT mesial temporal occipital hypodensity which is new from prior MRI. No abnormal intracranial enhancement.  No abnormal extra-axial fluid collections. Basal cisterns are patent.  No skull fracture. The included ocular globes and orbital contents are non-suspicious. The mastoid aircells and included paranasal sinuses are well-aerated.  CTA HEAD  Anterior circulation: Normal appearance of the cervical internal carotid arteries, petrous, cavernous and supra clinoid internal carotid arteries. Widely patent anterior communicating artery. Normal appearance of the anterior and middle cerebral arteries.  Posterior circulation: RIGHT vertebral artery is dominant with normal appearance of the vertebral arteries, vertebrobasilar junction and basilar artery, as well as main branch vessels. Focal high-grade stenosis of proximal LEFT P2 segment with luminal irregularity, the thready distal to this.  No large vessel occlusion, contrast extravasation or aneurysm within the anterior nor posterior circulation.  IMPRESSION: CT HEAD: Mild LEFT mesial temporal occipital lobe hypodensity concerning for acute infarct, new from September 28, 2014.  CTA HEAD: High-grade stenosis proximal LEFT P2 segment, which may represent embolic disease. No large vessel occlusion.  Acute findings discussed with and reconfirmed by Dr.IVA KNAPP on 10/03/2014 at 2:15 am.   Electronically Signed   By: Awilda Metroourtnay  Bloomer   On: 10/03/2014 02:15   Dg Chest 2 View  10/03/2014   CLINICAL DATA:  Cough with right-sided weakness.  EXAM: CHEST  2 VIEW   COMPARISON:  None.  FINDINGS: Lungs are hypoinflated without consolidation or effusion. Cardiomediastinal silhouette is within normal. Remaining bones and soft tissues are within normal.  IMPRESSION: Hypoinflation without acute cardiopulmonary disease.   Electronically Signed   By: Elberta Fortisaniel  Boyle M.D.   On: 10/03/2014 09:41   Mr Brain Wo Contrast  10/03/2014   CLINICAL DATA:  Acute onset of weakness the right upper and lower extremities at 6 p.m. last evening subsequent numbness involving the right upper and lower extremities is well is the face. The symptoms subsequently resolved.  EXAM: MRI HEAD WITHOUT CONTRAST  TECHNIQUE: Multiplanar, multiecho pulse sequences of the brain and surrounding structures were obtained without intravenous contrast.  COMPARISON:  CT head and CTA head 10/03/2014  FINDINGS: And inferomedial left temporal lobe infarct is confirmed. There are at least 3 other punctate foci of acute nonhemorrhagic infarction within the posterior left temporal lobe and occipital pole. These correspond to the high-grade proximal left P2 segment stenosis. No other  acute infarct is evident.  T2 changes are associated with the inferomedial left temporal lobe infarct. No other significant T2 changes are evident. The ventricles are of normal size. No significant extraaxial fluid collection is present.  Flow is present in the major intracranial arteries. There is abnormal signal within the proximal left PCA corresponding with the CTA abnormality.  The globes and orbits are intact. The paranasal sinuses and the left mastoid air cells are clear.  IMPRESSION: 1. Acute nonhemorrhagic infarct within the inferomedial left temporal lobe is confirmed. 2. At least 3 additional punctate nonhemorrhagic infarcts are evident within the posterior left temporal lobe and occipital pole. 3. Abnormal flow signal in the proximal left PCA compatible with the area of high-grade stenosis on the CTA.   Electronically Signed   By:  Marin Roberts M.D.   On: 10/03/2014 10:50     PHYSICAL EXAM Pleasant obese middle-aged Caucasian lady not in distress. . Afebrile. Head is nontraumatic. Neck is supple without bruit.    Cardiac exam no murmur or gallop. Lungs are clear to auscultation. Distal pulses are well felt. Neurological Exam ;  Awake  Alert oriented x 3. Normal speech and language.diminished recall 1/3. Normal attention, registration. eye movements full without nystagmus.fundi were not visualized. Vision acuity and fields appear normal. Hearing is normal. Palatal movements are normal. Face symmetric. Tongue midline. Normal strength, tone, reflexes and coordination. Normal sensation. Gait deferred. ASSESSMENT/PLAN Shawna Waters is a 59 y.o. 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 She did not receive IV t-PA due to resolving symptoms.   Stroke:  Dominant left PCA territory (temporal & occipital) infarcts secondary to L PCA stenosis without other intracranial atherosclerosis. 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 2. At least 3 additional punctate nonhemorrhagic infarcts are evident within the posterior left temporal lobe and occipital pole. 3. Abnormal flow signal in the proximal left PCA compatible with the area of high-grade stenosis on the CTA.  MRA  Unremarkable except for L PCA stenosis  Carotid Doppler done last week. No significant stenosis   2D Echo  Done last week. No source of embolus   HgbA1c 5.6, at goal  Lovenox 40 mg sq daily for VTE prophylaxis  TEE to look for embolic source. Please arrange with pts cardiologist or cardiologist of choice. If positive for PFO (patent foramen ovale), check bilateral lower extremity venous  dopplers to rule out DVT as possible source of stroke.   If TEE negative, a  [facility] electrophysiologist will consult and consider placement of an implantable loop recorder to evaluate for atrial fibrillation as etiology of stroke. This has been explained to patient/family by Dr. Pearlean Brownie and they are agreeable.  Diet Heart thin liquids  aspirin 81 mg orally every day prior to admission, now on aspirin 325 mg orally every day. Will change to plavix for secondary stroke prevention  Patient counseled to be compliant with her antithrombotic medications  Ongoing aggressive stroke risk factor management  Exercise encouraged with focus on cardiac; weight loss recommended.  Get a primary care MD   Therapy recommendations:  pending   Disposition:  pending   Hypertension  Home meds:   Lisinopril-HCTZ  Stable  Patient counseled to be compliant with her blood pressure medications  Hyperlipidemia  Home meds:  No statin   LDL 139, goal < 70  Add statin, lipitor 20 added  Continue statin at discharge  Other Stroke Risk Factors  Morbid Obesity, Body mass index is 41.82 kg/(m^2).   Family hx stroke (first cousin)  Hospital day # 0  [facility] See Amion for Pager information 10/03/2014 11:59 AM  I have personally examined this patient, reviewed notes, independently viewed imaging studies, participated in medical decision making and plan of care. I have made any additions or clarifications directly to the above note. Agree with note above. She has an embolic left PCA branch infarct etiology to be determined. She remains at recurrent risk for TIAs strokes, neurological worsening and needs ongoing stroke evaluation, aggressive risk factor modification. I spent a lot of time with the patient and family members and answered questions. She needs a TEE and loop recorder. Change aspirin to Plavix.  Delia Heady, MD Medical Director [facility] Pager: [phone] 10/03/2014 6:41 PM    To contact Stroke Continuity provider, please refer to [email or web address]. After hours, contact General Neurology

## 2014-10-03 NOTE — H&P (Signed)
[facility] History and Physical  Shawna FredricksonVickie B Waters ZOX:[id number] DOB: Nov 10, 1955 DOA: 10/03/2014  Referring physician: ER physician. PCP: Dow AdolphMCPHERSON,BARBARA, MD   Chief Complaint: Right-sided numbness and weakness.  HPI: Shawna FredricksonVickie B Coutts is a 59 y.o. female who was recently admitted last week on 09/27/2014 for possible TIA presents to the ER because of sudden onset of numbness with weakness of the right upper and lower extremities. Patient states that since her last discharge. Been having recurrent symptoms of weakness of the right side. Last evening around 6 PM patient started getting weak again on the right upper and lower extremity and around 10:15 PM patient also started experiencing numbness which was new. Numbness involving the right upper and lower extremity and the face. By the time patient ER patient was nonfocal and symptoms have resolved. CT head was showing features concerning for stroke and CT angiography head was done which did not show anything acute. On-call neurologist was consulted and patient had been admitted for further management of stroke. During recent admission patient had carotid Doppler and 2-D echo. Patient also was recently started on antihypertensives. Patient denies any chest pain or shortness of breath denies any difficulty talking swallowing or any visual symptoms.   Review of Systems: As presented in the history of presenting illness, rest negative.  Past Medical History  Diagnosis Date  . Hypertension    Past Surgical History  Procedure Laterality Date  . Abdominal hysterectomy     Social History:  reports that she has never smoked. She does not have any smokeless tobacco history on file. She reports that she does not drink alcohol or use illicit drugs. Where does patient live home. Can patient participate in ADLs? Yes.  Allergies  Allergen Reactions  . Codeine     Family History:  Family History  Problem Relation Age of Onset  . CAD Father        Prior to Admission medications   Medication Sig Start Date End Date Taking? Authorizing Provider  acetaminophen (TYLENOL) 500 MG tablet Take 1,000 mg by mouth every 6 (six) hours as needed for headache (headache & sinuses).    Historical Provider, MD  aspirin EC 81 MG tablet Take 1 tablet (81 mg total) by mouth daily. 09/28/14   Dorothea OgleIskra M Myers, MD  lisinopril-hydrochlorothiazide (PRINZIDE,ZESTORETIC) 20-12.5 MG per tablet Take 1 tablet by mouth daily. 09/28/14   Dorothea OgleIskra M Myers, MD    Physical Exam: Filed Vitals:   10/03/14 0300 10/03/14 0330 10/03/14 0345 10/03/14 0400  BP: 137/81 133/83  146/99  Pulse: 91 94  90  Temp:   98 F (36.7 C)   TempSrc:      Resp: 19 16  20   Height:      Weight:      SpO2: 98% 99%  99%     General:  Well-developed and well-nourished.  Eyes: Anicteric. No pallor.  ENT: No discharge from ears eyes nose or mouth.  Neck: No mass felt.  Cardiovascular: S1 and S2 heard.  Respiratory: No rhonchi or crepitations.  Abdomen: Soft nontender bowel sounds present.  Skin: No rash.  Musculoskeletal: No edema.  Psychiatric: Appears normal.  Neurologic: Alert awake oriented to time place and person. Moves all extremities 5 x 5. No facial asymmetry. Tongue is midline. PERRLA positive.  Labs on Admission:  Basic Metabolic Panel:  Recent Labs Lab 09/27/14 2011 09/28/14 0516 10/02/14 2257 10/03/14 0129  NA 139 139 138 139  K 4.2 3.8 3.9 3.8  CL 107 107  100 102  CO2 26 23  --   --   GLUCOSE 108* 102* 93 110*  BUN CREATININE 0.99 0.89 1.00 1.10  CALCIUM 9.3 8.7  --   --    Liver Function Tests:  Recent Labs Lab 09/28/14 0516  AST 24  ALT 22  ALKPHOS 74  BILITOT 0.4  PROT 6.7  ALBUMIN 3.8   No results for input(s): LIPASE, AMYLASE in the last 168 hours. No results for input(s): AMMONIA in the last 168 hours. CBC:  Recent Labs Lab 09/27/14 2011 09/28/14 0516 10/02/14 2248 10/02/14 2257 10/03/14 0129  WBC 8.3 7.3 9.3   --   --   NEUTROABS  --   --  5.5  --   --   HGB 13.8 12.4 15.0 16.0* 16.0*  HCT 40.6 38.5 43.9 47.0* 47.0*  MCV 94.9 95.8 94.2  --   --   PLT 319 321 343  --   --    Cardiac Enzymes:  Recent Labs Lab 10/03/14 0102  TROPONINI <0.03    BNP (last 3 results) No results for input(s): BNP in the last 8760 hours.  ProBNP (last 3 results) No results for input(s): PROBNP in the last 8760 hours.  CBG:  Recent Labs Lab 09/27/14 2000 09/27/14 2339 09/28/14 0747  GLUCAP 75 131* 96    Radiological Exams on Admission: Ct Angio Head W/cm &/or Wo Cm  10/03/2014   CLINICAL DATA:  Dizziness and RIGHT arm heaviness beginning September 25, 2014, worse 2 days later. Intermittent symptoms, multiple episodes in the last day, with LEFT facial numbness.  EXAM: CT ANGIOGRAPHY HEAD  TECHNIQUE: Multidetector CT imaging of the head was performed using the standard protocol during bolus administration of intravenous contrast. Multiplanar CT image reconstructions and MIPs were obtained to evaluate the vascular anatomy.  CONTRAST:  50mL OMNIPAQUE IOHEXOL 350 MG/ML SOLN  COMPARISON:  MRI of the brain September 28, 2014  FINDINGS: CT HEAD  The ventricles and sulci are normal. No intraparenchymal hemorrhage, mass effect nor midline shift. Mild LEFT mesial temporal occipital hypodensity which is new from prior MRI. No abnormal intracranial enhancement.  No abnormal extra-axial fluid collections. Basal cisterns are patent.  No skull fracture. The included ocular globes and orbital contents are non-suspicious. The mastoid aircells and included paranasal sinuses are well-aerated.  CTA HEAD  Anterior circulation: Normal appearance of the cervical internal carotid arteries, petrous, cavernous and supra clinoid internal carotid arteries. Widely patent anterior communicating artery. Normal appearance of the anterior and middle cerebral arteries.  Posterior circulation: RIGHT vertebral artery is dominant with normal appearance of the  vertebral arteries, vertebrobasilar junction and basilar artery, as well as main branch vessels. Focal high-grade stenosis of proximal LEFT P2 segment with luminal irregularity, the thready distal to this.  No large vessel occlusion, contrast extravasation or aneurysm within the anterior nor posterior circulation.  IMPRESSION: CT HEAD: Mild LEFT mesial temporal occipital lobe hypodensity concerning for acute infarct, new from September 28, 2014.  CTA HEAD: High-grade stenosis proximal LEFT P2 segment, which may represent embolic disease. No large vessel occlusion.  Acute findings discussed with and reconfirmed by Dr.IVA KNAPP on 10/03/2014 at 2:15 am.   Electronically Signed   By: Awilda Metro   On: 10/03/2014 02:15    EKG: Independently reviewed. Normal cerebellum.  Assessment/Plan Principal Problem:   Stroke Active Problems:   HTN (hypertension)   1. Stroke - appreciate neurology consult neurologist and Dr. Roseanne Reno is advised to  get MRI brain. Patient be kept on neuro checks and we will get PT and OT consult. Aspirin. 2. Hypertension - hold HCTZ for now as patient is getting gentle hydration and continue lisinopril. When necessary IV hydralazine for systolic blood pressure more than 210. We'll allow for permissive hypertension due to stroke.   DVT Prophylaxis Lovenox.  Code Status: Full code.  Family Communication: Patient has been at the bedside.  Disposition Plan: Admit to inpatient.    Ercil Cassis N. [facility] Pager [phone].  If 7PM-7AM, please contact night-coverage [email or web address] Password [facility] 10/03/2014, 4:48 AM

## 2014-10-03 NOTE — Progress Notes (Signed)
Patient seen and examined. Admitted secondary to recurrent right-sided numbness and weakness. Patient MRI showed multiple infarct suggesting possibility of cardiac emboli. Neurology has been consulted and patient will most likely require TEE and loop recorder.  Plan: -continue plavix for now as secondary prevention -continue risk factors modifications -neurology on board, will follow recommendations -follow PT and OT evaluation and rec's  Vassie LollMadera, Valree Feild [phone]

## 2014-10-03 NOTE — Consult Note (Signed)
Admission H&P    Chief Complaint: Recurrent right-sided numbness.  HPI: Shawna Waters is an 59 y.o. female history of hypertension as well as admission on 09/27/2014 for TIA workup with similar right side weakness and numbness. 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 She was seen again in the emergency room with similar symptoms on 10/01/2014. No focal deficits were noted. Patient was told to continue aspirin and follow-up appointment with [facility] was made. She is continue to take aspirin 81 mg per day. She had several episodes of right-sided numbness involving ascending arm and subsequently involving right leg. Last episode of side numbness started at 10:15 PM and has improved. CT scan of her head findings indicative of possible acute left medial parieto-occipital infarction. CTA was unremarkable except for high-grade left PCA P2 segment. Patient is being admitted for further evaluation to rule out acute stroke. NIH stroke score was 0.  LSN: 10:15 PM on 10/02/2014 tPA Given: No: Resolving deficits MRankin: 0  Past Medical History  Diagnosis Date  . Hypertension     Past Surgical History  Procedure Laterality Date  . Abdominal hysterectomy      Family history: Positive for stroke involving a first cousin; otherwise family history was noncontributory.  Social History:  reports that she has never smoked. She does not have any smokeless tobacco history on file. She reports that she does not drink alcohol or use illicit drugs.  Allergies:  Allergies  Allergen Reactions  . Codeine     Medications: Patient's preadmission medications were reviewed by me  ROS: History obtained from spouse and the patient  General ROS: negative for - chills, fatigue, fever, night sweats, weight gain or weight loss Psychological ROS: negative for - behavioral disorder, hallucinations, memory difficulties, mood swings or suicidal  ideation Ophthalmic ROS: negative for - blurry vision, double vision, eye pain or loss of vision ENT ROS: negative for - epistaxis, nasal discharge, oral lesions, sore throat, tinnitus or vertigo Allergy and Immunology ROS: negative for - hives or itchy/watery eyes Hematological and Lymphatic ROS: negative for - bleeding problems, bruising or swollen lymph nodes Endocrine ROS: negative for - galactorrhea, hair pattern changes, polydipsia/polyuria or temperature intolerance Respiratory ROS: negative for - cough, hemoptysis, shortness of breath or wheezing Cardiovascular ROS: negative for - chest pain, dyspnea on exertion, edema or irregular heartbeat Gastrointestinal ROS: negative for - abdominal pain, diarrhea, hematemesis, nausea/vomiting or stool incontinence Genito-Urinary ROS: negative for - dysuria, hematuria, incontinence or urinary frequency/urgency Musculoskeletal ROS: negative for - joint swelling or muscular weakness Neurological ROS: as noted in HPI Dermatological ROS: negative for rash and skin lesion changes  Physical Examination: Blood pressure 137/81, pulse 91, temperature 97.9 F (36.6 C), temperature source Oral, resp. rate 19, height  (1.549 m), weight 99.791 kg (220 lb), SpO2 98 %.  HEENT-  Normocephalic, no lesions, without obvious abnormality.  Normal external eye and conjunctiva.  Normal TM's bilaterally.  Normal auditory canals and external ears. Normal external nose, mucus membranes and septum.  Normal pharynx. Neck supple with no masses, nodes, nodules or enlargement. Cardiovascular - regular rate and rhythm, S1, S2 normal, no murmur, click, rub or gallop Lungs - chest clear, no wheezing, rales, normal symmetric air entry Abdomen - soft, non-tender; bowel sounds normal; no masses,  no organomegaly Extremities - no joint deformities, effusion, or inflammation, no edema and no skin discoloration Skin -   Neurologic Examination: Mental Status: Alert, oriented,  thought content appropriate.  Speech fluent without evidence of aphasia. Able to follow commands without difficulty. Cranial Nerves: II-Visual fields were normal. III/IV/VI-Pupils were equal and reacted. Extraocular movements were full and conjugate.    V/VII-no facial numbness and no facial weakness. VIII-normal. X-normal speech and symmetrical palatal movement. XI: trapezius strength/neck flexion strength normal bilaterally XII-midline tongue extension with normal strength. Motor: 5/5 bilaterally with normal tone and bulk Sensory: Normal throughout. Deep Tendon Reflexes: 1+ and symmetric. Plantars: Flexor bilaterally Cerebellar: Normal finger-to-nose testing. Carotid auscultation: Normal  Results for orders placed or performed during the hospital encounter of 10/03/14 (from the past 48 hour(s))  CBC with Differential     Status: None   Collection Time: 10/02/14 10:48 PM  Result Value Ref Range   WBC 9.3 4.0 - 10.5 K/uL   RBC 4.66 3.87 - 5.11 MIL/uL   Hemoglobin 15.0 12.0 - 15.0 g/dL   HCT 16.143.9 09.636.0 - 04.546.0 %   MCV 94.2 78.0 - 100.0 fL   MCH 32.2 26.0 - 34.0 pg   MCHC 34.2 30.0 - 36.0 g/dL   RDW 40.912.6 81.111.5 - 91.415.5 %   Platelets 343 150 - 400 K/uL   Neutrophils Relative % 59 43 - 77 %   Neutro Abs 5.5 1.7 - 7.7 K/uL   Lymphocytes Relative 31 12 - 46 %   Lymphs Abs 2.9 0.7 - 4.0 K/uL   Monocytes Relative 9 3 - 12 %   Monocytes Absolute 0.8 0.1 - 1.0 K/uL   Eosinophils Relative 1 0 - 5 %   Eosinophils Absolute 0.1 0.0 - 0.7 K/uL   Basophils Relative 0 0 - 1 %   Basophils Absolute 0.0 0.0 - 0.1 K/uL  I-Stat Chem 8, ED     Status: Abnormal   Collection Time: 10/02/14 10:57 PM  Result Value Ref Range   Sodium 138 135 - 145 mmol/L   Potassium 3.9 3.5 - 5.1 mmol/L   Chloride 100 96 - 112 mmol/L   BUN 21 6 - 23 mg/dL   Creatinine, Ser 7.821.00 0.50 - 1.10 mg/dL   Glucose, Bld 93 70 - 99 mg/dL   Calcium, Ion 9.561.09 (L) 1.12 - 1.23 mmol/L   TCO2 23 0 - 100 mmol/L   Hemoglobin 16.0 (H)  12.0 - 15.0 g/dL   HCT 21.347.0 (H) 08.636.0 - 57.846.0 %  I-stat Chem 8, ED     Status: Abnormal   Collection Time: 10/03/14  1:29 AM  Result Value Ref Range   Sodium 139 135 - 145 mmol/L   Potassium 3.8 3.5 - 5.1 mmol/L   Chloride 102 96 - 112 mmol/L   BUN 20 6 - 23 mg/dL   Creatinine, Ser 4.691.10 0.50 - 1.10 mg/dL   Glucose, Bld 629110 (H) 70 - 99 mg/dL   Calcium, Ion 5.281.16 4.131.12 - 1.23 mmol/L   TCO2 22 0 - 100 mmol/L   Hemoglobin 16.0 (H) 12.0 - 15.0 g/dL   HCT 24.447.0 (H) 01.036.0 - 27.246.0 %   Ct Angio Head W/cm &/or Wo Cm  10/03/2014   CLINICAL DATA:  Dizziness and RIGHT arm heaviness beginning September 25, 2014, worse 2 days later. Intermittent symptoms, multiple episodes in the last day, with LEFT facial numbness.  EXAM: CT ANGIOGRAPHY HEAD  TECHNIQUE: Multidetector CT imaging of the head was performed using the standard protocol during bolus administration of intravenous contrast. Multiplanar CT image reconstructions and MIPs were obtained to evaluate the vascular anatomy.  CONTRAST:  50mL OMNIPAQUE IOHEXOL 350 MG/ML  SOLN  COMPARISON:  MRI of the brain September 28, 2014  FINDINGS: CT HEAD  The ventricles and sulci are normal. No intraparenchymal hemorrhage, mass effect nor midline shift. Mild LEFT mesial temporal occipital hypodensity which is new from prior MRI. No abnormal intracranial enhancement.  No abnormal extra-axial fluid collections. Basal cisterns are patent.  No skull fracture. The included ocular globes and orbital contents are non-suspicious. The mastoid aircells and included paranasal sinuses are well-aerated.  CTA HEAD  Anterior circulation: Normal appearance of the cervical internal carotid arteries, petrous, cavernous and supra clinoid internal carotid arteries. Widely patent anterior communicating artery. Normal appearance of the anterior and middle cerebral arteries.  Posterior circulation: RIGHT vertebral artery is dominant with normal appearance of the vertebral arteries, vertebrobasilar junction and  basilar artery, as well as main branch vessels. Focal high-grade stenosis of proximal LEFT P2 segment with luminal irregularity, the thready distal to this.  No large vessel occlusion, contrast extravasation or aneurysm within the anterior nor posterior circulation.  IMPRESSION: CT HEAD: Mild LEFT mesial temporal occipital lobe hypodensity concerning for acute infarct, new from September 28, 2014.  CTA HEAD: High-grade stenosis proximal LEFT P2 segment, which may represent embolic disease. No large vessel occlusion.  Acute findings discussed with and reconfirmed by Dr.IVA KNAPP on 10/03/2014 at 2:15 am.   Electronically Signed   By: Awilda Metro   On: 10/03/2014 02:15    Assessment: 59 y.o. female with a history of hypertension and recent workup for stroke risk assessment unremarkable except for left PCA P2 high-grade stenosis, presenting with probable acute left medial temporal occipital ischemic infarction.  Stroke Risk Factors - hypertension  Plan: 1. MRI of the brain without contrast 2. PT consult, OT consult 3. Prophylactic therapy-Antiplatelet med: Aspirin  4. Risk factor modification 5. Telemetry monitoring  C.R. Roseanne Reno, MD [facility] [phone]  10/03/2014, 3:15 AM

## 2014-10-03 NOTE — Progress Notes (Signed)
Pt admitted to room 4N22 from ED. Pt is alert and oriented and in no distress. Pt is reporting numbness in her head and right arm.  Bed alarm on and call bell within reach.  Will continue to monitor.

## 2014-10-04 ENCOUNTER — Ambulatory Visit: Payer: BC Managed Care – PPO | Admitting: Neurology

## 2014-10-04 ENCOUNTER — Encounter (HOSPITAL_COMMUNITY)
Admission: EM | Disposition: A | Discharge status: Home / Self Care | Payer: Self-pay | Source: Home / Self Care | Attending: Internal Medicine

## 2014-10-04 ENCOUNTER — Encounter (HOSPITAL_COMMUNITY): Payer: Self-pay | Admitting: *Deleted

## 2014-10-04 ENCOUNTER — Ambulatory Visit: Payer: Self-pay | Admitting: Family Medicine

## 2014-10-04 DIAGNOSIS — I639 Cerebral infarction, unspecified: Secondary | ICD-10-CM

## 2014-10-04 DIAGNOSIS — E785 Hyperlipidemia, unspecified: Secondary | ICD-10-CM

## 2014-10-04 HISTORY — PX: LOOP RECORDER IMPLANT: SHX5477

## 2014-10-04 HISTORY — PX: TEE WITHOUT CARDIOVERSION: SHX5443

## 2014-10-04 LAB — HEMOGLOBIN A1C
Hgb A1c MFr Bld: 5.5 % (ref 4.8–5.6)
Mean Plasma Glucose: 111 mg/dL

## 2014-10-04 SURGERY — ECHOCARDIOGRAM, TRANSESOPHAGEAL
Anesthesia: Moderate Sedation

## 2014-10-04 SURGERY — LOOP RECORDER IMPLANT
Anesthesia: LOCAL

## 2014-10-04 MED ORDER — MIDAZOLAM HCL 5 MG/ML IJ SOLN
INTRAMUSCULAR | Status: AC
  Filled 2014-10-04: qty 2

## 2014-10-04 MED ORDER — ATORVASTATIN CALCIUM 20 MG PO TABS: 20.0000 mg | ORAL_TABLET | Freq: Every day | ORAL | Status: DC

## 2014-10-04 MED ORDER — LIDOCAINE-EPINEPHRINE 1 %-1:100000 IJ SOLN
INTRAMUSCULAR | Status: AC
  Filled 2014-10-04: qty 1

## 2014-10-04 MED ORDER — BUTAMBEN-TETRACAINE-BENZOCAINE 2-2-14 % EX AERO
INHALATION_SPRAY | CUTANEOUS | Status: DC | PRN
  Administered 2014-10-04: 2 via TOPICAL

## 2014-10-04 MED ORDER — FENTANYL CITRATE 0.05 MG/ML IJ SOLN
INTRAMUSCULAR | Status: AC
  Filled 2014-10-04: qty 2

## 2014-10-04 MED ORDER — MIDAZOLAM HCL 10 MG/2ML IJ SOLN
INTRAMUSCULAR | Status: DC | PRN
  Administered 2014-10-04: 2 mg via INTRAVENOUS
  Administered 2014-10-04: 1 mg via INTRAVENOUS
  Administered 2014-10-04: 2 mg via INTRAVENOUS
  Administered 2014-10-04: 1 mg via INTRAVENOUS

## 2014-10-04 MED ORDER — FENTANYL CITRATE 0.05 MG/ML IJ SOLN
INTRAMUSCULAR | Status: DC | PRN
  Administered 2014-10-04 (×3): 25 ug via INTRAVENOUS

## 2014-10-04 MED ORDER — CLOPIDOGREL BISULFATE 75 MG PO TABS: 75.0000 mg | ORAL_TABLET | Freq: Every day | ORAL | Status: DC

## 2014-10-04 MED ORDER — SODIUM CHLORIDE 0.9 % IV SOLN
INTRAVENOUS | Status: DC
  Administered 2014-10-04: 500 mL via INTRAVENOUS

## 2014-10-04 NOTE — Discharge Instructions (Signed)
Keep incision clean and dry for 3 days. Ok to remove large dressing tomorrow.  Leave strips of tape in place until seen in office for wound check.

## 2014-10-04 NOTE — Progress Notes (Signed)
Pt is being discharged home. Discharge instructions were given to patient and family 

## 2014-10-04 NOTE — Interval H&P Note (Signed)
History and Physical Interval Note:  10/04/2014 3:42 PM  Shawna Waters  has presented today for surgery, with the diagnosis of STROKE  The various methods of treatment have been discussed with the patient and family. After consideration of risks, benefits and other options for treatment, the patient has consented to  Procedure(s): TRANSESOPHAGEAL ECHOCARDIOGRAM (TEE) (N/A) as a surgical intervention .  The patient's history has been reviewed, patient examined, no change in status, stable for surgery.  I have reviewed the patient's chart and labs.  Questions were answered to the patient's satisfaction.     Dalton Chesapeake EnergyMcLean

## 2014-10-04 NOTE — Progress Notes (Signed)
  Echocardiogram Echocardiogram Transesophageal has been performed.  Cathie BeamsGREGORY, Quinesha Selinger 10/04/2014, 4:17 PM

## 2014-10-04 NOTE — Progress Notes (Signed)
CARE MANAGEMENT NOTE 10/04/2014  Patient:  Shawna Waters,Shawna Waters   Account Number:  [id number]  Date Initiated:  10/04/2014  Documentation initiated by:  Jiles CrockerHANDLER,Deriyah Kunath  Subjective/Objective Assessment:   ADMITTED WITH STROKE     Action/Plan:   CM FOLLOWING FOR DCP   Anticipated DC Date:  10/08/2014   Anticipated DC Plan:  AWAITING FOR PT/OT EVALS FOR DISPOSITION NEEDS     DC Planning Services  CM consult          Status of service:  In process, will continue to follow  Per UR Regulation:  Reviewed for med. necessity/level of care/duration of stay  Comments:  3/10/2016Abelino Derrick- Waters Wreatha Sturgeon RN,BSN,MHA [phone]

## 2014-10-04 NOTE — H&P (View-Only) (Signed)
Patient seen and examined. Admitted secondary to recurrent right-sided numbness and weakness. Patient MRI showed multiple infarct suggesting possibility of cardiac emboli. Neurology has been consulted and patient will most likely require TEE and loop recorder.  Plan: -continue plavix for now as secondary prevention -continue risk factors modifications -neurology on board, will follow recommendations -follow PT and OT evaluation and rec's  Shawna Waters [phone] 

## 2014-10-04 NOTE — Progress Notes (Signed)
PT Cancellation Note  Patient Details Name: Shawna Waters MRN: [id number] DOB: 11-01-1955   Cancelled Treatment:    Reason Eval Not Completed: PT screened, no needs identified, will sign off  Spoke with Wendi Conarpe, OT re: patient's balance during high-level tasks during OT and pt is at baseline.    Fabian Walder 10/04/2014, 1:17 PM  Pager [phone]

## 2014-10-04 NOTE — Progress Notes (Signed)
Pt returned from TEE and is alerted and oriented X4

## 2014-10-04 NOTE — Progress Notes (Signed)
STROKE TEAM PROGRESS NOTE   HISTORY Shawna Waters is an 59 y.o. female history of hypertension as well as admission on 09/27/2014 for TIA workup with similar right side weakness and numbness. 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 She was seen again in the emergency room with similar symptoms on 10/01/2014. No focal deficits were noted. Patient was told to continue aspirin and follow-up appointment with [facility] was made. She is continue to take aspirin 81 mg per day. She had several episodes of right-sided numbness involving ascending arm and subsequently involving right leg. Last episode of side numbness started at 10:15 PM and has improved. CT scan of her head findings indicative of possible acute left medial parieto-occipital infarction. CTA was unremarkable except for high-grade left PCA P2 segment. Patient is being admitted for further evaluation to rule out acute stroke. NIH stroke score was 0. She was LSN: 10:15 PM on 10/02/2014. Patient was not administered TPA secondary to Resolving deficits. She was admitted for further evaluation and treatment.   SUBJECTIVE (INTERVAL HISTORY) Her husband is at the bedside. She is waiting to go to endo for TEE. She feels her memory is improving.   OBJECTIVE Temp:  [98 F (36.7 C)-98.4 F (36.9 C)] 98.2 F (36.8 C) (03/10 1006) Pulse Rate:  [64-86] 80 (03/10 1006) Cardiac Rhythm:  [-] Normal sinus rhythm (03/10 0800) Resp:  [18-20] 20 (03/10 1006) BP: (115-151)/(70-84) 134/82 mmHg (03/10 1006) SpO2:  [96 %-100 %] 100 % (03/10 1006)   Recent Labs Lab 09/27/14 2000 09/27/14 2339 09/28/14 0747  GLUCAP 75 131* 96    Recent Labs Lab 09/27/14 2011 09/28/14 0516 10/02/14 2257 10/03/14 0129 10/03/14 0655  NA 139 139 138 139 139  K 4.2 3.8 3.9 3.8 4.0  CL 107 107 100 102 108  CO2 26 23  --   --  18*  GLUCOSE 108* 102* 93 110* 96  BUN 18 14 21 20 15   CREATININE 0.99  0.89 1.00 1.10 0.93  CALCIUM 9.3 8.7  --   --  9.3    Recent Labs Lab 09/28/14 0516 10/03/14 0655  AST 24 23  ALT 22 18  ALKPHOS 74 77  BILITOT 0.4 1.0  PROT 6.7 7.7  ALBUMIN 3.8 3.9    Recent Labs Lab 09/27/14 2011 09/28/14 0516 10/02/14 2248 10/02/14 2257 10/03/14 0129 10/03/14 0655  WBC 8.3 7.3 9.3  --   --  8.6  NEUTROABS  --   --  5.5  --   --  5.9  HGB 13.8 12.4 15.0 16.0* 16.0* 14.2  HCT 40.6 38.5 43.9 47.0* 47.0* 42.7  MCV 94.9 95.8 94.2  --   --  94.9  PLT 319 321 343  --   --  332    Recent Labs Lab 10/03/14 0102  TROPONINI <0.03    Recent Labs  10/03/14 0102  LABPROT 13.3  INR 1.00   No results for input(s): COLORURINE, LABSPEC, PHURINE, GLUCOSEU, HGBUR, BILIRUBINUR, KETONESUR, PROTEINUR, UROBILINOGEN, NITRITE, LEUKOCYTESUR in the last 72 hours.  Invalid input(s): APPERANCEUR     Component Value Date/Time   CHOL 198 10/03/2014 0655   TRIG 106 10/03/2014 0655   HDL 38* 10/03/2014 0655   CHOLHDL 5.2 10/03/2014 0655   VLDL 21 10/03/2014 0655   LDLCALC 139* 10/03/2014 0655   Lab Results  Component Value Date   HGBA1C 5.5 10/03/2014      Component Value Date/Time   LABOPIA  NONE DETECTED 09/27/2014 2250   COCAINSCRNUR NONE DETECTED 09/27/2014 2250   LABBENZ NONE DETECTED 09/27/2014 2250   AMPHETMU NONE DETECTED 09/27/2014 2250   THCU NONE DETECTED 09/27/2014 2250   LABBARB NONE DETECTED 09/27/2014 2250    No results for input(s): ETH in the last 168 hours.  Ct Angio Head W/cm &/or Wo Cm  10/03/2014   CLINICAL DATA:  Dizziness and RIGHT arm heaviness beginning September 25, 2014, worse 2 days later. Intermittent symptoms, multiple episodes in the last day, with LEFT facial numbness.  EXAM: CT ANGIOGRAPHY HEAD  TECHNIQUE: Multidetector CT imaging of the head was performed using the standard protocol during bolus administration of intravenous contrast. Multiplanar CT image reconstructions and MIPs were obtained to evaluate the vascular anatomy.   CONTRAST:  50mL OMNIPAQUE IOHEXOL 350 MG/ML SOLN  COMPARISON:  MRI of the brain September 28, 2014  FINDINGS: CT HEAD  The ventricles and sulci are normal. No intraparenchymal hemorrhage, mass effect nor midline shift. Mild LEFT mesial temporal occipital hypodensity which is new from prior MRI. No abnormal intracranial enhancement.  No abnormal extra-axial fluid collections. Basal cisterns are patent.  No skull fracture. The included ocular globes and orbital contents are non-suspicious. The mastoid aircells and included paranasal sinuses are well-aerated.  CTA HEAD  Anterior circulation: Normal appearance of the cervical internal carotid arteries, petrous, cavernous and supra clinoid internal carotid arteries. Widely patent anterior communicating artery. Normal appearance of the anterior and middle cerebral arteries.  Posterior circulation: RIGHT vertebral artery is dominant with normal appearance of the vertebral arteries, vertebrobasilar junction and basilar artery, as well as main branch vessels. Focal high-grade stenosis of proximal LEFT P2 segment with luminal irregularity, the thready distal to this.  No large vessel occlusion, contrast extravasation or aneurysm within the anterior nor posterior circulation.  IMPRESSION: CT HEAD: Mild LEFT mesial temporal occipital lobe hypodensity concerning for acute infarct, new from September 28, 2014.  CTA HEAD: High-grade stenosis proximal LEFT P2 segment, which may represent embolic disease. No large vessel occlusion.  Acute findings discussed with and reconfirmed by Dr.IVA KNAPP on 10/03/2014 at 2:15 am.   Electronically Signed   By: Awilda Metro   On: 10/03/2014 02:15   Dg Chest 2 View  10/03/2014   CLINICAL DATA:  Cough with right-sided weakness.  EXAM: CHEST  2 VIEW  COMPARISON:  None.  FINDINGS: Lungs are hypoinflated without consolidation or effusion. Cardiomediastinal silhouette is within normal. Remaining bones and soft tissues are within normal.  IMPRESSION:  Hypoinflation without acute cardiopulmonary disease.   Electronically Signed   By: Elberta Fortis M.D.   On: 10/03/2014 09:41   Mr Brain Wo Contrast  10/03/2014   CLINICAL DATA:  Acute onset of weakness the right upper and lower extremities at 6 p.m. last evening subsequent numbness involving the right upper and lower extremities is well is the face. The symptoms subsequently resolved.  EXAM: MRI HEAD WITHOUT CONTRAST  TECHNIQUE: Multiplanar, multiecho pulse sequences of the brain and surrounding structures were obtained without intravenous contrast.  COMPARISON:  CT head and CTA head 10/03/2014  FINDINGS: And inferomedial left temporal lobe infarct is confirmed. There are at least 3 other punctate foci of acute nonhemorrhagic infarction within the posterior left temporal lobe and occipital pole. These correspond to the high-grade proximal left P2 segment stenosis. No other acute infarct is evident.  T2 changes are associated with the inferomedial left temporal lobe infarct. No other significant T2 changes are evident. The ventricles are of normal  size. No significant extraaxial fluid collection is present.  Flow is present in the major intracranial arteries. There is abnormal signal within the proximal left PCA corresponding with the CTA abnormality.  The globes and orbits are intact. The paranasal sinuses and the left mastoid air cells are clear.  IMPRESSION: 1. Acute nonhemorrhagic infarct within the inferomedial left temporal lobe is confirmed. 2. At least 3 additional punctate nonhemorrhagic infarcts are evident within the posterior left temporal lobe and occipital pole. 3. Abnormal flow signal in the proximal left PCA compatible with the area of high-grade stenosis on the CTA.   Electronically Signed   By: Marin Robertshristopher  Mattern M.D.   On: 10/03/2014 10:50     PHYSICAL EXAM Pleasant obese middle-aged Caucasian lady not in distress. . Afebrile. Head is nontraumatic. Neck is supple without bruit.    Cardiac  exam no murmur or gallop. Lungs are clear to auscultation. Distal pulses are well felt. Neurological Exam ;  Awake  Alert oriented x 3. Normal speech and language.diminished recall 1/3. Normal attention, registration. eye movements full without nystagmus.fundi were not visualized. Vision acuity and fields appear normal. Hearing is normal. Palatal movements are normal. Face symmetric. Tongue midline. Normal strength, tone, reflexes and coordination. Normal sensation. Gait deferred.   ASSESSMENT/PLAN Ms. Norma FredricksonVickie B Bobrowski is a 59 y.o. 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 She did not receive IV t-PA due to resolving symptoms.   Stroke:  Dominant left PCA branch (temporal & occipital) infarcts secondary to L PCA stenosis without other intracranial atherosclerosis. 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 2. At least 3 additional punctate nonhemorrhagic infarcts are evident within the posterior left temporal lobe and occipital pole. 3. Abnormal flow signal in the proximal left PCA compatible with the area of high-grade stenosis on the CTA.  MRA  Unremarkable except for L PCA stenosis  Carotid Doppler done last week. No significant stenosis   2D Echo  Done last week. No source of embolus   HgbA1c 5.6, at goal  Lovenox 40 mg sq daily for VTE prophylaxis  TEE today to look for embolic source.  If positive for PFO (patent foramen ovale), check bilateral lower extremity venous dopplers to rule out DVT as possible source of stroke.   If TEE negative, [facility] electrophys[facility] will place an implantable loop recorder to evaluate for atrial fibrillation as etiology of stroke.   Diet NPO time specified    aspirin 81 mg orally every day prior to admission, changed to clopidogrel 75 mg orally every day.   Patient counseled to be compliant with her antithrombotic medications  Ongoing aggressive stroke risk factor management  Exercise encouraged with focus on cardiac; weight loss recommended.  Get a primary care MD   Therapy recommendations:  pending   Disposition:  pending   Ok for discharge from neuro standpoint once TEE/loop completed.  Follow up with Dr. Pearlean BrownieSethi in 2 mos (order written)  Hypertension  Home meds:   Lisinopril-HCTZ  Stable  Patient counseled to be compliant with her blood pressure medications  Hyperlipidemia  Home meds:  No statin   LDL 139, goal < 70  Add statin, lipitor 20 added  Continue statin at discharge  Other Stroke Risk Factors  Morbid Obesity,  Body mass index is 41.82 kg/(m^2).   Family hx stroke (first cousin)  Hospital day # 1  Rhoderick Moody [facility] See Amion for Pager information 10/04/2014 12:01 PM  I have personally examined this patient, reviewed notes, independently viewed imaging studies, participated in medical decision making and plan of care. I have made any additions or clarifications directly to the above note. Agree with note above. Plan OTC and loop recorder placement today. Follow-up as an outpatient in 2 months. Discussed with patient and husband and answered questions   Delia Heady, MD Medical Director [facility] Pager: [phone] 10/04/2014 3:11 PM    To contact Stroke Continuity provider, please refer to [email or web address]. After hours, contact General Neurology

## 2014-10-04 NOTE — Discharge Summary (Signed)
Physician Discharge Summary  Shawna Waters NFA:[id number] DOB: 10-07-1955 DOA: 10/03/2014  PCP: Dow Adolph, MD  Admit date: 10/03/2014 Discharge date: 10/04/2014  Time spent: >30 minutes  Recommendations for Outpatient Follow-up:  Reassess basic metabolic panel to follow electrolytes and renal function Recheck blood pressure and adjust antihypertensive regimen as needed Patient will need follow-up with neurology in 2 months Please recheck LFTs and lipid panel in 3 month and adjust statins regimen as needed  Discharge Diagnoses:  Acute ischemic Stroke HTN (hypertension) Hyperlipidemia LDL goal <70 Severe obesity (BMI more than 40)  Discharge Condition: Stable and improved. Patient has been discharged home with instructions to follow with PCP in 2 weeks and with neurology in 2 months.  Diet recommendation: Heart healthy and low fat diet.  Filed Weights   10/02/14 2236 10/03/14 0444  Weight: 99.791 kg (220 lb) 100.336 kg (221 lb 3.2 oz)    History of present illness:  59 y.o. female who was recently admitted last week on 09/27/2014 for possible TIA presents to the ER because of sudden onset of numbness with weakness of the right upper and lower extremities. Patient states that since her last discharge. Been having recurrent symptoms of weakness of the right side. Last evening around 6 PM patient started getting weak again on the right upper and lower extremity and around 10:15 PM patient also started experiencing numbness which was new. Numbness involving the right upper and lower extremity and the face. By the time patient ER patient was nonfocal and symptoms have resolved. CT head was was done and demonstrated left occipital lobe hypodensity concerning for acute infarct; CT angiography head was done and demonstrated high-grade stenosis proximal left P2 segment which may represent embolic disease. There was no large vessel occlusion. Patient was admitted to the hospital for further  evaluation and treatment.  Hospital Course:  1-acute ischemic dominant left PCA branch (temporal & occipital) infarcts secondary to L PCA stenosis without other intracranial atherosclerosis. -Initially presumed to be secondary to embolic source -TEE has failedl to demonstrate any source of embolism -Loop recorder has been implanted -Patient symptoms has pretty much resolved and there is no significant residual deficit -Per neurology recommendations will use Plavix for secondary prevention -Patient will be started on Lipitor 20 mg by mouth daily -Continue risk factors modifications (blood pressure control, weight loss) -Follow up with Dr. Pearlean Brownie in 2 months as an outpatient  2 hypertension: Patient with good response to recent initiation of lisinopril and HCTZ -Advised to continue low sodium diet -She will follow with PCP in 2 weeks for further medication adjustment  3-hyperlipidemia: LDL on this admission 139 after positive ischemic stroke call is for less than 70 -Patient initiated on Lipitor 20 mg by mouth daily  4-morbid obesity: Low calorie diet and exercise has been encouraged -Body mass index is 41.82 kg/(m^2).   Procedures:  TEE: Normal LV size with mild to moderate LV hypertrophy. EF 55-60%. Normal RV size and systolic function. Normal atrial sizes. No LAA thrombus. No significant MR or TR. No aortic stenosis. Mild plaque in the descending thoracic aorta. Negative bubble study, no evidence for PFO or ASD.   Implantation of loop recorder  See below for x-ray reports  Consultations:  Neurology  Cardiology (for TEE and implantation of loop recorder)  Discharge Exam: Filed Vitals:   10/04/14 1631  BP: 133/74  Pulse: 87  Temp:   Resp: 19    General: Afebrile, denies chest pain, no shortness of breath. Reports that weakness/numbness on  her right side has now completely resolved Cardiovascular: S1 and S2, no rubs, no gallops, no murmurs Respiratory: Clear to  auscultation bilaterally Abdomen: Soft, nontender, nondistended, positive bowel sounds Extremities: No cyanosis, no clubbing; patient denies any tenderness and there is no redness or isolated swelling on exam   Discharge Instructions   Discharge Instructions    Ambulatory referral to Neurology    Complete by:  As directed   Please schedule post stroke follow up in 2 months.     Diet - low sodium heart healthy    Complete by:  As directed      Discharge instructions    Complete by:  As directed   Take medications as prescribed Follow a heart healthy diet (sodium less than 2.5-3 gram per day) Low calorie diet and increase exercise Follow up with PCP in 2 weeks Follow up with neurology service (Dr. Pearlean Brownie) in 2 months Call office for follow up visit to review implanted loop recorded (Dr. Johney Frame)          Current Discharge Medication List    START taking these medications   Details  atorvastatin (LIPITOR) 20 MG tablet Take 1 tablet (20 mg total) by mouth daily at 6 PM. Qty: 30 tablet, Refills: 1    clopidogrel (PLAVIX) 75 MG tablet Take 1 tablet (75 mg total) by mouth daily. Qty: 30 tablet, Refills: 1      CONTINUE these medications which have NOT CHANGED   Details  lisinopril-hydrochlorothiazide (PRINZIDE,ZESTORETIC) 20-12.5 MG per tablet Take 1 tablet by mouth daily. Qty: 30 tablet, Refills: 1      STOP taking these medications     aspirin EC 81 MG tablet        Allergies  Allergen Reactions  . Codeine Other (See Comments)    Husband not aware of reaction.    Follow-up Information    Follow up with [facility] On 10/11/2014.   Why:  at 12Noon for wound check   Contact information:   [address] [place] [phone]       Follow up with Delia Heady, MD In 2 months.   Specialties:  Neurology, Radiology   Why:  Stroke Clinic, Office will call you with appointment date & time   Contact information:   [address] [place] [address] [phone]       [facility], [facility]. Schedule an appointment as soon as possible for a visit in 2 weeks.   Specialty:  Family Medicine   Contact information:   [address] [place] [address] [phone]       Follow up with Hillis Range, MD.   Specialty:  Cardiology   Why:  to receive information regaridng follow up visit to assess incision of implanted loop recorder   Contact information:   [address] [place] [address] [phone]       The results of significant diagnostics from this hospitalization (including imaging, microbiology, ancillary and laboratory) are listed below for reference.    Significant Diagnostic Studies: Ct Angio Head W/cm &/or Wo Cm  10/03/2014   CLINICAL DATA:  Dizziness and RIGHT arm heaviness beginning September 25, 2014, worse 2 days later. Intermittent symptoms, multiple episodes in the last day, with LEFT facial numbness.  EXAM: CT ANGIOGRAPHY HEAD  TECHNIQUE: Multidetector CT imaging of the head was performed using the standard protocol during bolus administration of intravenous contrast. Multiplanar CT image reconstructions and MIPs were  obtained to evaluate the vascular anatomy.  CONTRAST:  50mL OMNIPAQUE IOHEXOL 350 MG/ML SOLN  COMPARISON:  MRI of the brain September 28, 2014  FINDINGS: CT HEAD  The ventricles and sulci are normal. No intraparenchymal hemorrhage, mass effect nor midline shift. Mild LEFT mesial temporal occipital hypodensity which is new from prior MRI. No abnormal intracranial enhancement.  No abnormal extra-axial fluid collections. Basal cisterns are patent.  No skull fracture. The included ocular globes and orbital contents are non-suspicious. The mastoid aircells and included paranasal sinuses are well-aerated.  CTA HEAD  Anterior circulation: Normal appearance of the cervical internal carotid arteries, petrous, cavernous and supra clinoid internal carotid arteries. Widely  patent anterior communicating artery. Normal appearance of the anterior and middle cerebral arteries.  Posterior circulation: RIGHT vertebral artery is dominant with normal appearance of the vertebral arteries, vertebrobasilar junction and basilar artery, as well as main branch vessels. Focal high-grade stenosis of proximal LEFT P2 segment with luminal irregularity, the thready distal to this.  No large vessel occlusion, contrast extravasation or aneurysm within the anterior nor posterior circulation.  IMPRESSION: CT HEAD: Mild LEFT mesial temporal occipital lobe hypodensity concerning for acute infarct, new from September 28, 2014.  CTA HEAD: High-grade stenosis proximal LEFT P2 segment, which may represent embolic disease. No large vessel occlusion.  Acute findings discussed with and reconfirmed by Dr.IVA KNAPP on 10/03/2014 at 2:15 am.   Electronically Signed   By: Awilda Metroourtnay  Bloomer   On: 10/03/2014 02:15   Dg Chest 2 View  10/03/2014   CLINICAL DATA:  Cough with right-sided weakness.  EXAM: CHEST  2 VIEW  COMPARISON:  None.  FINDINGS: Lungs are hypoinflated without consolidation or effusion. Cardiomediastinal silhouette is within normal. Remaining bones and soft tissues are within normal.  IMPRESSION: Hypoinflation without acute cardiopulmonary disease.   Electronically Signed   By: Elberta Fortisaniel  Boyle M.D.   On: 10/03/2014 09:41   Ct Head Wo Contrast  09/27/2014   CLINICAL DATA:  Weakness and dizziness  EXAM: CT HEAD WITHOUT CONTRAST  TECHNIQUE: Contiguous axial images were obtained from the base of the skull through the vertex without intravenous contrast.  COMPARISON:  None.  FINDINGS: The bony calvarium is intact. No gross soft tissue abnormality is noted. No findings to suggest acute hemorrhage, acute infarction or space-occupying mass lesion are noted.  IMPRESSION: No acute intracranial abnormality noted.   Electronically Signed   By: Alcide CleverMark  Lukens M.D.   On: 09/27/2014 21:42   Mr Brain Wo Contrast  10/03/2014    CLINICAL DATA:  Acute onset of weakness the right upper and lower extremities at 6 p.m. last evening subsequent numbness involving the right upper and lower extremities is well is the face. The symptoms subsequently resolved.  EXAM: MRI HEAD WITHOUT CONTRAST  TECHNIQUE: Multiplanar, multiecho pulse sequences of the brain and surrounding structures were obtained without intravenous contrast.  COMPARISON:  CT head and CTA head 10/03/2014  FINDINGS: And inferomedial left temporal lobe infarct is confirmed. There are at least 3 other punctate foci of acute nonhemorrhagic infarction within the posterior left temporal lobe and occipital pole. These correspond to the high-grade proximal left P2 segment stenosis. No other acute infarct is evident.  T2 changes are associated with the inferomedial left temporal lobe infarct. No other significant T2 changes are evident. The ventricles are of normal size. No significant extraaxial fluid collection is present.  Flow is present in the major intracranial arteries. There is abnormal signal within the proximal left PCA corresponding with  the CTA abnormality.  The globes and orbits are intact. The paranasal sinuses and the left mastoid air cells are clear.  IMPRESSION: 1. Acute nonhemorrhagic infarct within the inferomedial left temporal lobe is confirmed. 2. At least 3 additional punctate nonhemorrhagic infarcts are evident within the posterior left temporal lobe and occipital pole. 3. Abnormal flow signal in the proximal left PCA compatible with the area of high-grade stenosis on the CTA.   Electronically Signed   By: Marin Roberts M.D.   On: 10/03/2014 10:50   Mri Brain Without Contrast  09/28/2014   CLINICAL DATA:  TIA. Episodes of right arm weakness and heaviness. Episodes of lightheadedness and blurred vision. Occasional nausea.  EXAM: MRI HEAD WITHOUT CONTRAST  MRA HEAD WITHOUT CONTRAST  TECHNIQUE: Multiplanar, multiecho pulse sequences of the brain and surrounding  structures were obtained without intravenous contrast. Angiographic images of the head were obtained using MRA technique without contrast.  COMPARISON:  CT head without contrast 09/27/2014.  FINDINGS: MRI HEAD FINDINGS  The diffusion-weighted images demonstrate no evidence for acute or subacute infarction. No hemorrhage or mass lesion is present. No significant white matter disease is present.  The ventricles are of normal size. No significant extraaxial fluid collection is present. Flow is present in the major intracranial arteries. The globes and orbits are intact.  Mild mucosal thickening is present in the anterior ethmoid air cells and inferior frontal sinuses. The paranasal sinuses and mastoid air cells are otherwise clear.  The skullbase is unremarkable.  MRA HEAD FINDINGS  Internal carotid arteries are within normal limits from the high cervical segments through the ICA termini. The A1 and M1 segments are normal. There is minimal attenuation of distal MCA branch vessels.  The right vertebral artery feeds the basilar artery. The left vertebral artery is hypoplastic and terminates at the PICA. The basilar artery is normal. Both posterior cerebral arteries originate from the basilar tip. There is focal signal loss in the proximal left posterior cerebral artery and asymmetric attenuation of distal PCA branch vessels on the left.  IMPRESSION: 1. Normal MRI appearance of the brain. No acute or focal lesion to explain the patient's symptoms. 2. Minimal sinus disease. 3. 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 Given the lack of other proximal disease, CTA could be used for further evaluation in the appropriate clinical setting. Correlation with the patient's symptoms would be prudent. 4. Minimal distal small vessel disease is present without other significant proximal stenosis, aneurysm, or branch vessel occlusion.   Electronically Signed   By:  Marin Roberts M.D.   On: 09/28/2014 07:22   Mr Maxine Glenn Head/brain Wo Cm  09/28/2014   CLINICAL DATA:  TIA. Episodes of right arm weakness and heaviness. Episodes of lightheadedness and blurred vision. Occasional nausea.  EXAM: MRI HEAD WITHOUT CONTRAST  MRA HEAD WITHOUT CONTRAST  TECHNIQUE: Multiplanar, multiecho pulse sequences of the brain and surrounding structures were obtained without intravenous contrast. Angiographic images of the head were obtained using MRA technique without contrast.  COMPARISON:  CT head without contrast 09/27/2014.  FINDINGS: MRI HEAD FINDINGS  The diffusion-weighted images demonstrate no evidence for acute or subacute infarction. No hemorrhage or mass lesion is present. No significant white matter disease is present.  The ventricles are of normal size. No significant extraaxial fluid collection is present. Flow is present in the major intracranial arteries. The globes and orbits are intact.  Mild mucosal thickening is present in the anterior ethmoid air  cells and inferior frontal sinuses. The paranasal sinuses and mastoid air cells are otherwise clear.  The skullbase is unremarkable.  MRA HEAD FINDINGS  Internal carotid arteries are within normal limits from the high cervical segments through the ICA termini. The A1 and M1 segments are normal. There is minimal attenuation of distal MCA branch vessels.  The right vertebral artery feeds the basilar artery. The left vertebral artery is hypoplastic and terminates at the PICA. The basilar artery is normal. Both posterior cerebral arteries originate from the basilar tip. There is focal signal loss in the proximal left posterior cerebral artery and asymmetric attenuation of distal PCA branch vessels on the left.  IMPRESSION: 1. Normal MRI appearance of the brain. No acute or focal lesion to explain the patient's symptoms. 2. Minimal sinus disease. 3. Signal loss in the proximal left posterior cerebral artery suggesting a moderate to  high-grade stenosis. Given the lack of other proximal disease, CTA could be used for further evaluation in the appropriate clinical setting. Correlation with the patient's symptoms would be prudent. 4. Minimal distal small vessel disease is present without other significant proximal stenosis, aneurysm, or branch vessel occlusion.   Electronically Signed   By: Marin Roberts M.D.   On: 09/28/2014 07:22   Labs: Basic Metabolic Panel:  Recent Labs Lab 09/27/14 2011 09/28/14 0516 10/02/14 2257 10/03/14 0129 10/03/14 0655  NA 139 139 138 139 139  K 4.2 3.8 3.9 3.8 4.0  CL 107 107 100 102 108  CO2 26 23  --   --  18*  GLUCOSE 108* 102* 93 110* 96  BUN 18 14 21 20 15   CREATININE 0.99 0.89 1.00 1.10 0.93  CALCIUM 9.3 8.7  --   --  9.3   Liver Function Tests:  Recent Labs Lab 09/28/14 0516 10/03/14 0655  AST 24 23  ALT 22 18  ALKPHOS 74 77  BILITOT 0.4 1.0  PROT 6.7 7.7  ALBUMIN 3.8 3.9   CBC:  Recent Labs Lab 09/27/14 2011 09/28/14 0516 10/02/14 2248 10/02/14 2257 10/03/14 0129 10/03/14 0655  WBC 8.3 7.3 9.3  --   --  8.6  NEUTROABS  --   --  5.5  --   --  5.9  HGB 13.8 12.4 15.0 16.0* 16.0* 14.2  HCT 40.6 38.5 43.9 47.0* 47.0* 42.7  MCV 94.9 95.8 94.2  --   --  94.9  PLT 319 321 343  --   --  332   Cardiac Enzymes:  Recent Labs Lab 10/03/14 0102  TROPONINI <0.03   CBG:  Recent Labs Lab 09/27/14 2000 09/27/14 2339 09/28/14 0747  GLUCAP 75 131* 96    Signed:  Vassie Loll  [facility] 10/04/2014, 5:14 PM

## 2014-10-04 NOTE — Op Note (Signed)
SURGEON:  Hillis RangeJames Romey Cohea, MD     PREPROCEDURE DIAGNOSIS:  Cryptogenic Stroke    POSTPROCEDURE DIAGNOSIS:  Cryptogenic Stroke     PROCEDURES:   1. Implantable loop recorder implantation    INTRODUCTION:  Shawna Waters is a 59 y.o. female with a history of unexplained stroke who presents today for implantable loop implantation.  The patient has had a cryptogenic stroke.  Despite an extensive workup by neurology, no reversible causes have been identified.  she has worn telemetry during which she did not have arrhythmias.  There is significant concern for possible atrial fibrillation as the cause for the patients stroke.  The patient therefore presents today for implantable loop implantation.     DESCRIPTION OF PROCEDURE:  Informed written consent was obtained, and the patient was brought to the electrophysiology lab in a fasting state.  The patient required no sedation for the procedure today.  Mapping over the patient's chest was performed by the EP lab staff to identify the area where electrograms were most prominent for ILR recording.  This area was found to be the left parasternal region over the 3rd-4th intercostal space. The patients left chest was therefore prepped and draped in the usual sterile fashion by the EP lab staff. The skin overlying the left parasternal region was infiltrated with lidocaine for local analgesia.  A 0.5-cm incision was made over the left parasternal region over the 3rd intercostal space.  A subcutaneous ILR pocket was fashioned using a combination of sharp and blunt dissection.  A Medtronic Reveal Linq model [id number] SN [id number] S implantable loop recorder was then placed into the pocket  R waves were very prominent and measured 0.476mV. EBL<1 ml.  Steri- Strips and a sterile dressing were then applied.  There were no early apparent complications.     CONCLUSIONS:   1. Successful implantation of a Medtronic Reveal LINQ implantable loop recorder for cryptogenic stroke  2. No  early apparent complications.

## 2014-10-04 NOTE — CV Procedure (Signed)
Procedure: TEE  Indication: CVA  Sedation: Versed 6 mg IV, Fentanyl 75 mcg IV  Findings: Please see echo section for full report.  Normal LV size with mild to moderate LV hypertrophy.  EF 55-60%.  Normal RV size and systolic function.  Normal atrial sizes.  No LAA thrombus.  No significant MR or TR.  No aortic stenosis.  Mild plaque in the descending thoracic aorta.  Negative bubble study, no evidence for PFO or ASD.    No source for embolism.   Shawna Waters 10/04/2014 4:02 PM

## 2014-10-04 NOTE — Evaluation (Signed)
Occupational Therapy Evaluation Patient Details Name: Shawna Waters MRN: [id number] DOB: Sep 23, 1955 Today's Date: 10/04/2014    History of Present Illness Shawna Waters was admitted on 10/03/2014 with right sided weakness and numbness. Imaging demonstrated dominant left PCA territory infarcts.   Clinical Impression   Patient evaluated by Occupational Therapy with no further acute OT needs identified. All education has been completed and the patient has no further questions. Pt appears to be back to baseline and is independent with ADLs.  No balance loss during activities - PT informed.  See below for any follow-up Occupational Therapy or equipment needs. OT is signing off. Thank you for this referral.      Follow Up Recommendations  No OT follow up    Equipment Recommendations  None recommended by OT    Recommendations for Other Services       Precautions / Restrictions Precautions Precautions: None      Mobility Bed Mobility                  Transfers Overall transfer level: Independent                    Balance Overall balance assessment: No apparent balance deficits (not formally assessed)                             High Level Balance Comments: Pt able to retrieve items from floor, simulate shower in standing, walk backwards, while counting and turns 360* with no LOB             ADL Overall ADL's : Independent                                             Vision Vision Assessment?: Yes Eye Alignment: Within Functional Limits Ocular Range of Motion: Within Functional Limits Alignment/Gaze Preference: Within Defined Limits Tracking/Visual Pursuits: Able to track stimulus in all quads without difficulty Saccades: Within functional limits Visual Fields: No apparent deficits   Development worker, international aid Tested?: Yes   Praxis Praxis Praxis tested?: Within functional limits    Pertinent Vitals/Pain  Pain Assessment: No/denies pain     Hand Dominance Right   Extremity/Trunk Assessment Upper Extremity Assessment Upper Extremity Assessment: Overall WFL for tasks assessed   Lower Extremity Assessment Lower Extremity Assessment: Overall WFL for tasks assessed   Cervical / Trunk Assessment Cervical / Trunk Assessment: Normal   Communication Communication Communication: No difficulties   Cognition Arousal/Alertness: Awake/alert Behavior During Therapy: WFL for tasks assessed/performed Overall Cognitive Status: Within Functional Limits for tasks assessed                     General Comments       Exercises       Shoulder Instructions      Home Living Family/patient expects to be discharged to:: Private residence Living Arrangements: Spouse/significant other Available Help at Discharge: Family Type of Home: House Home Access: Stairs to enter Secretary/administrator of Steps: 3 Entrance Stairs-Rails: Right Home Layout: One level     Bathroom Shower/Tub: Tub/shower unit Shower/tub characteristics: Engineer, building services: Standard     Home Equipment: None          Prior Functioning/Environment Level of Independence: Independent        Comments: Pt is retired  first grade teacher    OT Diagnosis:     OT Problem List:     OT Treatment/Interventions:      OT Goals(Current goals can be found in the care plan section) Acute Rehab OT Goals OT Goal Formulation: All assessment and education complete, DC therapy  OT Frequency:     Barriers to D/C:            Co-evaluation              End of Session Nurse Communication: Mobility status  Activity Tolerance: Patient tolerated treatment well Patient left: in chair;with family/visitor present   Time: [id number] OT Time Calculation (min): 24 min Charges:  OT General Charges $OT Visit: 1 Procedure OT Evaluation $Initial OT Evaluation Tier I: 1 Procedure OT Treatments $Therapeutic  Activity: 8-22 mins G-Codes:    Shawna Waters M 10/04/2014, 12:51 PM

## 2014-10-05 ENCOUNTER — Telehealth: Payer: Self-pay | Admitting: Internal Medicine

## 2014-10-05 ENCOUNTER — Encounter (HOSPITAL_COMMUNITY): Payer: Self-pay | Admitting: Cardiology

## 2014-10-05 NOTE — Telephone Encounter (Signed)
[facility]. Pt was not instructed on how to hook up monitor. Pt was not enrolled in [facility]. Pt enrolled and receiving transmissions thru monitor. All questions answered and pt aware of wound check on 3-17 and follow up with JA 11-26-14.

## 2014-10-05 NOTE — Telephone Encounter (Signed)
New Message  Pt was scheduling f/u w/ Allred (currently @ 5/2) per d/x notes: f/u to receive information regaridng follow up visit to assess incision of implanted loop recorder. Pt sister wanted to know if it was okay to wait until [facility] for F/u. Please call back and discuss.

## 2014-10-06 ENCOUNTER — Emergency Department (HOSPITAL_COMMUNITY)
Admission: EM | Admit: 2014-10-06 | Discharge: 2014-10-07 | Disposition: A | Discharge status: Home / Self Care | Payer: BC Managed Care – PPO | Attending: Emergency Medicine | Admitting: Emergency Medicine

## 2014-10-06 ENCOUNTER — Encounter (HOSPITAL_COMMUNITY): Payer: Self-pay | Admitting: Emergency Medicine

## 2014-10-06 DIAGNOSIS — Z8673 Personal history of transient ischemic attack (TIA), and cerebral infarction without residual deficits: Secondary | ICD-10-CM

## 2014-10-06 DIAGNOSIS — I10 Essential (primary) hypertension: Secondary | ICD-10-CM | POA: Insufficient documentation

## 2014-10-06 DIAGNOSIS — M6281 Muscle weakness (generalized): Secondary | ICD-10-CM | POA: Insufficient documentation

## 2014-10-06 DIAGNOSIS — I639 Cerebral infarction, unspecified: Secondary | ICD-10-CM | POA: Insufficient documentation

## 2014-10-06 DIAGNOSIS — Z79899 Other long term (current) drug therapy: Secondary | ICD-10-CM | POA: Diagnosis not present

## 2014-10-06 DIAGNOSIS — Z7902 Long term (current) use of antithrombotics/antiplatelets: Secondary | ICD-10-CM | POA: Insufficient documentation

## 2014-10-06 DIAGNOSIS — R29898 Other symptoms and signs involving the musculoskeletal system: Secondary | ICD-10-CM

## 2014-10-06 NOTE — ED Notes (Signed)
PerEMS: Pt had right arm heaviness and flaccidity for 3 minutes this evening, and has reversed since.  Pt has no neurological deficits.  Pt reports being seen here Tuesday and had a stroke.  Pt in no pain.  154/104, 83hr, rr16, 97%, cbg 96

## 2014-10-06 NOTE — ED Provider Notes (Signed)
CSN: [id number]     Arrival date & time 10/06/14  2248 History  This chart was scribed for Marisa Severin, MD by Abel Presto, ED Scribe. This patient was seen in room A11C/A11C and the patient's care was started at 11:20 PM.    Chief Complaint  Patient presents with  . Transient Ischemic Attack      The history is provided by the patient and the spouse. No language interpreter was used.    HPI Comments: America Sandall Liszewski is a 59 y.o. female brought in by ambulance, with PMHx of HTN who presents to the Emergency Department complaining of lightheadedness, right arm "heaviness", and weakness lasting a couple of minutes with onset PTA. Pt states she was resting at onset.  Pt was seen in ED on 10/02/14 for same symptoms. Workup resulted in admission for acute ischemic stroke. Pt had TEE without cardioversion and a loop recorder implanted at that time.  Pt was discharged on 10/04/14. Pt has been compliant with Lipitor and Plavix prescribed. Pt is set to f/u with cardiologist, PCP, and neurologist within the next few weeks.  Pt's husband reports that she has been complaining of intermittent pain in left posterior knee with onset several weeks ago with no significant changes in pain. Pt notes ambulation aggravates the pain. Husband is concerned for thrombosis. He reports she did not have a doppler done while in Pt A&O x4.  Pt denies swelling, redness, and pain in bilateral LE, palpitations, and dizziness.   Past Medical History  Diagnosis Date  . Hypertension    Past Surgical History  Procedure Laterality Date  . Abdominal hysterectomy    . Loop recorder implant N/A 10/04/2014    Procedure: LOOP RECORDER IMPLANT;  Surgeon: Hillis Range, MD;  Location: [facility] CATH LAB;  Service: Cardiovascular;  Laterality: N/A;  . Tee without cardioversion N/A 10/04/2014    Procedure: TRANSESOPHAGEAL ECHOCARDIOGRAM (TEE);  Surgeon: Laurey Morale, MD;  Location: [facility] ENDOSCOPY;  Service: Cardiovascular;  Laterality: N/A;    Family History  Problem Relation Age of Onset  . CAD Father    History  Substance Use Topics  . Smoking status: Never Smoker   . Smokeless tobacco: Not on file  . Alcohol Use: No   OB History    No data available     Review of Systems  Cardiovascular: Negative for palpitations and leg swelling.  Musculoskeletal: Positive for myalgias.  Skin: Negative for color change.  Neurological: Positive for weakness and light-headedness. Negative for dizziness.      Allergies  Codeine  Home Medications   Prior to Admission medications   Medication Sig Start Date End Date Taking? Authorizing Provider  atorvastatin (LIPITOR) 20 MG tablet Take 1 tablet (20 mg total) by mouth daily at 6 PM. 10/04/14  Yes Vassie Loll, MD  clopidogrel (PLAVIX) 75 MG tablet Take 1 tablet (75 mg total) by mouth daily. 10/04/14  Yes Vassie Loll, MD  lisinopril-hydrochlorothiazide (PRINZIDE,ZESTORETIC) 20-12.5 MG per tablet Take 1 tablet by mouth daily. 09/28/14  Yes Dorothea Ogle, MD   BP 117/64 mmHg  Pulse 78  Temp(Src) 98.9 F (37.2 C) (Oral)  Resp 20  Ht  (1.549 m)  Wt 220 lb (99.791 kg)  BMI 41.59 kg/m2  SpO2 97% Physical Exam  Constitutional: She is oriented to person, place, and time. She appears well-developed and well-nourished.  HENT:  Head: Normocephalic and atraumatic.  Nose: Nose normal.  Mouth/Throat: Oropharynx is clear and moist.  Eyes: Conjunctivae and EOM  are normal. Pupils are equal, round, and reactive to light.  Neck: Normal range of motion. Neck supple. No JVD present. No tracheal deviation present. No thyromegaly present.  Cardiovascular: Normal rate, regular rhythm, normal heart sounds and intact distal pulses.  Exam reveals no gallop and no friction rub.   No murmur heard. Pulmonary/Chest: Effort normal and breath sounds normal. No stridor. No respiratory distress. She has no wheezes. She has no rales. She exhibits no tenderness.  Abdominal: Soft. Bowel sounds are  normal. She exhibits no distension and no mass. There is no tenderness. There is no rebound and no guarding.  Musculoskeletal: Normal range of motion. She exhibits no edema or tenderness.  Lymphadenopathy:    She has no cervical adenopathy.  Neurological: She is alert and oriented to person, place, and time. She displays normal reflexes. No cranial nerve deficit. She exhibits normal muscle tone. Coordination normal.  Skin: Skin is warm and dry. No rash noted. No erythema. No pallor.  Psychiatric: She has a normal mood and affect. Her behavior is normal. Judgment and thought content normal.  Nursing note and vitals reviewed.   ED Course  Procedures (including critical care time) DIAGNOSTIC STUDIES: Oxygen Saturation is 97% on room air, normal by my interpretation.    COORDINATION OF CARE: 11:37 PM Discussed treatment plan with patient at beside, the patient agrees with the plan and has no further questions at this time.   Labs Review Labs Reviewed - No data to display  Imaging Review Ct Head Wo Contrast  10/07/2014   CLINICAL DATA:  Stroke October 02, 2014, 3 minutes episode of RIGHT arm weakness last night. History of hypertension, obesity, hyperlipidemia.  EXAM: CT HEAD WITHOUT CONTRAST  TECHNIQUE: Contiguous axial images were obtained from the base of the skull through the vertex without intravenous contrast.  COMPARISON:  MRI of the brain October 03, 2014  FINDINGS: Patchy areas of hypodensity and LEFT mesial temporal occipital lobes again seen. No hemorrhagic conversion. No midline shift or mass effect. The ventricles and sulci are overall normal for patient's age.  No abnormal extra-axial fluid collections. Basal cisterns are patent.  Ocular globes and orbital contents are unremarkable. Visualized paranasal sinuses and mastoid air cells are well aerated with exception of RIGHT posterior ethmoid small mucosal retention cyst. The mastoid air cells are well aerated. No skull fracture.  IMPRESSION:  Evolving LEFT mesial temporal occipital infarct without acute intracranial process.   Electronically Signed   By: Awilda Metro   On: 10/07/2014 05:23     EKG Interpretation   Date/Time:  Saturday October 06 2014 22:56:52 EST Ventricular Rate:  83 PR Interval:  139 QRS Duration: 86 QT Interval:  379 QTC Calculation: 445 R Axis:   45 Text Interpretation:  Sinus rhythm Probable anterior infarct, age  indeterminate No significant change since last tracing Confirmed by Irmalee Riemenschneider   MD, Khyli Swaim ([id number]) on 10/06/2014 11:20:00 PM      MDM   Final diagnoses:  Right arm weakness  Stroke occurring within last month   59 year old female with brief episode of right arm heaviness and weakness, recent with thorough workup of stroke, thought to have possible embolic source.  Family is concerned that she does not have leg workup done.  She complains of some right posterior knee pain, none on exam, no swelling noted.  Case discussed with Dr. Roseanne Reno, who recommends CT head for possible hemorrhagic conversion.  If negative, is stable to follow-up as possible.  She is medically optimized.  Symptoms have resolved.  I personally performed the services described in this documentation, which was scribed in my presence. The recorded information has been reviewed and is accurate.     Marisa Severinlga Alixandra Alfieri, MD 10/07/14 [phone]

## 2014-10-07 ENCOUNTER — Emergency Department (HOSPITAL_COMMUNITY): Payer: BC Managed Care – PPO

## 2014-10-07 ENCOUNTER — Encounter (HOSPITAL_COMMUNITY): Payer: Self-pay | Admitting: Radiology

## 2014-10-07 ENCOUNTER — Ambulatory Visit (HOSPITAL_COMMUNITY)
Admission: RE | Admit: 2014-10-07 | Discharge: 2014-10-07 | Disposition: A | Discharge status: Home / Self Care | Payer: BC Managed Care – PPO | Source: Ambulatory Visit | Attending: Emergency Medicine | Admitting: Emergency Medicine

## 2014-10-07 DIAGNOSIS — M79605 Pain in left leg: Secondary | ICD-10-CM | POA: Insufficient documentation

## 2014-10-07 DIAGNOSIS — M79609 Pain in unspecified limb: Secondary | ICD-10-CM | POA: Diagnosis not present

## 2014-10-07 DIAGNOSIS — M79604 Pain in right leg: Secondary | ICD-10-CM | POA: Insufficient documentation

## 2014-10-07 NOTE — ED Notes (Signed)
Pt attempting to rest at this time; awaiting CT results

## 2014-10-07 NOTE — Progress Notes (Signed)
VASCULAR LAB PRELIMINARY  PRELIMINARY  PRELIMINARY  PRELIMINARY  Bilateral lower extremity venous Dopplers completed.    Preliminary report:  There is no DVT or SVT noted in the bilateral lower extremities.   Geraldean Walen, RVT 10/07/2014, 9:50 AM

## 2014-10-07 NOTE — Discharge Instructions (Signed)
IMPORTANT PATIENT INSTRUCTIONS:  You have been scheduled for an Outpatient Vascular Study at [facility].    [facility] Sunday, please go to the [facility] Registration Desk at 8:30 am tomorrow morning and tell them you are there for a vascular study.  If tomorrow is a weekday (Monday-Friday), please [facility] to [facility] at 8:30 am and tell them you  are  there for a vascular study.   Your CT scan today showed your prior stroke but no new serious changes or bleeding.  Follow-up for the outpatient vascular study in the morning.  Keep your follow-up appointments as arranged.  Continue taking your medications.  At this time, there are no new changes to your regimen.

## 2014-10-07 NOTE — ED Notes (Signed)
Pt ambulatory to restroom with stand-by assist. Steady gait noted. No neuro deficits noted at this time.

## 2014-10-10 ENCOUNTER — Ambulatory Visit (INDEPENDENT_AMBULATORY_CARE_PROVIDER_SITE_OTHER): Payer: BC Managed Care – PPO | Admitting: Family Medicine

## 2014-10-10 ENCOUNTER — Encounter: Payer: Self-pay | Admitting: Family Medicine

## 2014-10-10 VITALS — BP 111/80 | HR 83 | Temp 98.1°F | Resp 16 | Ht 61.5 in | Wt 214.0 lb

## 2014-10-10 DIAGNOSIS — F418 Other specified anxiety disorders: Secondary | ICD-10-CM | POA: Diagnosis not present

## 2014-10-10 DIAGNOSIS — I6350 Cerebral infarction due to unspecified occlusion or stenosis of unspecified cerebral artery: Secondary | ICD-10-CM

## 2014-10-10 DIAGNOSIS — F329 Major depressive disorder, single episode, unspecified: Secondary | ICD-10-CM | POA: Diagnosis not present

## 2014-10-10 DIAGNOSIS — I6990 Unspecified sequelae of unspecified cerebrovascular disease: Secondary | ICD-10-CM | POA: Diagnosis not present

## 2014-10-10 DIAGNOSIS — R7989 Other specified abnormal findings of blood chemistry: Secondary | ICD-10-CM

## 2014-10-10 DIAGNOSIS — R946 Abnormal results of thyroid function studies: Secondary | ICD-10-CM | POA: Diagnosis not present

## 2014-10-10 DIAGNOSIS — IMO0002 Reserved for concepts with insufficient information to code with codable children: Secondary | ICD-10-CM

## 2014-10-10 DIAGNOSIS — I10 Essential (primary) hypertension: Secondary | ICD-10-CM | POA: Diagnosis not present

## 2014-10-10 LAB — CBC WITH DIFFERENTIAL/PLATELET
Basophils Absolute: 0.1 10*3/uL (ref 0.0–0.1)
Basophils Relative: 1 % (ref 0–1)
EOS PCT: 1 % (ref 0–5)
Eosinophils Absolute: 0.1 10*3/uL (ref 0.0–0.7)
HEMATOCRIT: 44.9 % (ref 36.0–46.0)
HEMOGLOBIN: 15.2 g/dL — AB (ref 12.0–15.0)
LYMPHS PCT: 22 % (ref 12–46)
Lymphs Abs: 1.6 10*3/uL (ref 0.7–4.0)
MCH: 31.1 pg (ref 26.0–34.0)
MCHC: 33.9 g/dL (ref 30.0–36.0)
MCV: 91.8 fL (ref 78.0–100.0)
MPV: 10.7 fL (ref 8.6–12.4)
Monocytes Absolute: 0.5 10*3/uL (ref 0.1–1.0)
Monocytes Relative: 7 % (ref 3–12)
Neutro Abs: 5 10*3/uL (ref 1.7–7.7)
Neutrophils Relative %: 69 % (ref 43–77)
PLATELETS: 412 10*3/uL — AB (ref 150–400)
RBC: 4.89 MIL/uL (ref 3.87–5.11)
RDW: 12.7 % (ref 11.5–15.5)
WBC: 7.3 10*3/uL (ref 4.0–10.5)

## 2014-10-10 MED ORDER — SERTRALINE HCL 50 MG PO TABS: 50.0000 mg | ORAL_TABLET | Freq: Every day | ORAL | Status: DC

## 2014-10-10 MED ORDER — ALPRAZOLAM 0.25 MG PO TABS: ORAL_TABLET | ORAL | Status: DC

## 2014-10-10 NOTE — Patient Instructions (Addendum)
Blood pressure medication: lisinopril- HCTZ 20-12.5 mg per tablet - Take 1/2 tablet daily until follow-up.  Depression and anxiety medication:  Sertraline 50 mg tablet - Take 1/2 tablet every morning for 1 week then increase to 1 tablet every morning.   Alprazolam- take 1 tablet at bedtime for sleep and to relax you. If you get anxious during the day, you can take 1/2 tablet.

## 2014-10-11 ENCOUNTER — Ambulatory Visit: Payer: BC Managed Care – PPO

## 2014-10-11 LAB — THYROID PANEL WITH TSH
Free Thyroxine Index: 3 (ref 1.4–3.8)
T3 Uptake: 29 % (ref 22–35)
T4, Total: 10.2 ug/dL (ref 4.5–12.0)
TSH: 2.055 u[IU]/mL (ref 0.350–4.500)

## 2014-10-11 LAB — BASIC METABOLIC PANEL WITH GFR
BUN: 20 mg/dL (ref 6–23)
CALCIUM: 9.8 mg/dL (ref 8.4–10.5)
CHLORIDE: 99 meq/L (ref 96–112)
CO2: 23 mEq/L (ref 19–32)
CREATININE: 1.13 mg/dL — AB (ref 0.50–1.10)
GFR, Est African American: 62 mL/min
GFR, Est Non African American: 54 mL/min — ABNORMAL LOW
Glucose, Bld: 96 mg/dL (ref 70–99)
Potassium: 4.6 mEq/L (ref 3.5–5.3)
Sodium: 135 mEq/L (ref 135–145)

## 2014-10-11 LAB — VITAMIN D 25 HYDROXY (VIT D DEFICIENCY, FRACTURES): Vit D, 25-Hydroxy: 25 ng/mL — ABNORMAL LOW (ref 30–100)

## 2014-10-12 ENCOUNTER — Ambulatory Visit: Payer: BC Managed Care – PPO | Admitting: *Deleted

## 2014-10-12 ENCOUNTER — Telehealth: Payer: Self-pay

## 2014-10-12 VITALS — BP 132/78 | HR 90

## 2014-10-12 DIAGNOSIS — G459 Transient cerebral ischemic attack, unspecified: Secondary | ICD-10-CM

## 2014-10-12 LAB — MDC_IDC_ENUM_SESS_TYPE_INCLINIC
Date Time Interrogation Session: 20160318115037
MDC IDC SET ZONE DETECTION INTERVAL: 2000 ms
MDC IDC SET ZONE DETECTION INTERVAL: 350 ms
Zone Setting Detection Interval: 4500 ms

## 2014-10-12 NOTE — Telephone Encounter (Signed)
Dr Audria NineMcPherson   Patient is reporting in on her blood pressure,  After the decrease in BP Meds  Reading at night was 114/72 This morning it was 145/90  B/4 going to the cardiologist.  At his office it was 132/77.  She may have had anxiety b/4 the OV.    [phone]

## 2014-10-12 NOTE — Progress Notes (Signed)
Patient presents for device clinic ILR wound check.  No problems with shortness of breath, chest pain, palpitations, or syncope. Patient does have c/o general not feeling well this morning and BP reading was elevated at home.  BP checked in office today 132/78. Patient instructed to call Dr. Angelyn PuntMcPherson's office to notify them of BP readings and patient's symptoms. Patient reports understanding and feeling better after today's visit.  Device interrogated and found to be functioning normally.  No changes made today.  See [facility] report for full details.  Plan ROV with Dr. Johney FrameAllred 11/26/14.  Bethanie DickerLong, Safiya Girdler M, RN, BSN 10/12/2014 11:28 AM

## 2014-10-12 NOTE — Progress Notes (Signed)
Subjective:    Patient ID: Shawna Waters, female    DOB: 14-May-1956, 59 y.o.   MRN: [id number]  HPI  This 59 y.o. Female is new to [facility], here to establish care after hospitalization for TIA symptoms 09/27/14- 09/28/14. Discharge diagnoses: TIA, HTN (207/110 treated w/ ACEI) and severe obesity. Neurology evaluation: negative CT of head followed by MRI w/ no signs of stroke and negative ECG. Pt was started on ASA 81 mg daily per neurology recommendation. Diagnostic tests are in progress w/ 2-D ECHO pending at that discharge.   Pt was discharged then presented again to ED on 10/03/14 with dizziness and heaviness in R arm. It was determined that she had completed stroke with ischemic infarcts in the L PCA branch secondary to intracranial atherosclerosis. Cardiac evaluation include TEE and LOOP Recorder implant device was placed. Plavix and Lipitor 20 mg were prescribed (ASA discontinued); to be evaluated by Dr. Pearlean Brownie as outpatient in 2 months.  Pt is accompanied today by her husband. She is anxious w/ increased symptoms in the evening. This is the time of day when her neurologic symptoms have surfaced and require attention in the ED. Pt is not sleeping well. She has very good family support and has family members are coming to help pt . She has been the caregiver among family and friends; she is reluctant to accept help from friends. Her husband has discussed this with her.  She is independent in the home but wonders if home OT or PT would help w/ intermittent R arm heaviness. I reviewed notes in EPIC re: inpatient OT/PT evaluations. It appears that she did not requires full PT eval and OT felt she was near baseline and would not need intensive therapy.   Patient Active Problem List   Diagnosis Date Noted  . HLD (hyperlipidemia)   . Acute ischemic stroke 10/03/2014  . Stroke 10/03/2014  . Hyperlipidemia LDL goal <100 10/03/2014  . TIA (transient ischemic attack) 09/27/2014  . HTN (hypertension)  09/27/2014  . Overweight   . Hyperglycemia   . Severe obesity (BMI >= 40)     Prior to Admission medications   Medication Sig Start Date End Date Taking? Authorizing Provider  atorvastatin (LIPITOR) 20 MG tablet Take 1 tablet (20 mg total) by mouth daily at 6 PM. 10/04/14  Yes Vassie Loll, MD  clopidogrel (PLAVIX) 75 MG tablet Take 1 tablet (75 mg total) by mouth daily. 10/04/14  Yes Vassie Loll, MD  lisinopril-hydrochlorothiazide (PRINZIDE,ZESTORETIC) 20-12.5 MG per tablet Take 1 tablet by mouth daily.    Historical Provider, MD    Past Surgical History  Procedure Laterality Date  . Abdominal hysterectomy    . Loop recorder implant N/A 10/04/2014    Procedure: LOOP RECORDER IMPLANT;  Surgeon: Hillis Range, MD;  Location: [facility] CATH LAB;  Service: Cardiovascular;  Laterality: N/A;  . Tee without cardioversion N/A 10/04/2014    Procedure: TRANSESOPHAGEAL ECHOCARDIOGRAM (TEE);  Surgeon: Laurey Morale, MD;  Location: [facility] ENDOSCOPY;  Service: Cardiovascular;  Laterality: N/A;    History   Social History  . Marital Status: Married    Spouse Name: N/A  . Number of Children: N/A  . Years of Education: N/A   Occupational History  . Not on file.   Social History Main Topics  . Smoking status: Never Smoker   . Smokeless tobacco: Not on file  . Alcohol Use: No  . Drug Use: No  . Sexual Activity: Not on file   Other Topics Concern  .  Not on file   Social History Narrative    Family History  Problem Relation Age of Onset  . CAD Father     Review of Systems  Constitutional: Positive for activity change, appetite change and fatigue. Negative for fever, diaphoresis and unexpected weight change.  HENT: Negative.   Eyes: Negative for pain and visual disturbance.  Respiratory: Negative for cough, choking, chest tightness, shortness of breath and wheezing.   Cardiovascular: Negative.   Gastrointestinal: Negative for nausea, vomiting, abdominal pain, diarrhea and constipation.    Endocrine: Negative.   Genitourinary: Negative for urgency, frequency, enuresis and difficulty urinating.  Musculoskeletal: Negative for myalgias, joint swelling, arthralgias and gait problem.  Skin: Negative.   Neurological: Positive for dizziness and numbness. Negative for syncope, facial asymmetry, speech difficulty, weakness, light-headedness and headaches.       No neurologic symptoms since last hospital discharge.  Psychiatric/Behavioral: Positive for sleep disturbance, dysphoric mood and decreased concentration. Negative for suicidal ideas, behavioral problems, self-injury and agitation. The patient is nervous/anxious.       Objective:   Physical Exam  Constitutional: She is oriented to person, place, and time. She appears well-developed and well-nourished. No distress.  Blood pressure 111/80, pulse 83, temperature 98.1 F (36.7 C), resp. rate 16, height 5' 1.5" (1.562 m), weight 214 lb (97.07 kg), SpO2 98 %.   HENT:  Head: Normocephalic and atraumatic.  Right Ear: External ear normal.  Left Ear: External ear normal.  Nose: Nose normal.  Mouth/Throat: Oropharynx is clear and moist.  Eyes: Conjunctivae and EOM are normal. No scleral icterus.  Neck: Normal range of motion. Neck supple.  Cardiovascular: Normal rate, regular rhythm and normal heart sounds.   Pulmonary/Chest: Effort normal. No respiratory distress.  Neurological: She is alert and oriented to person, place, and time. She has normal strength. She displays no tremor. No cranial nerve deficit or sensory deficit. She exhibits normal muscle tone. Coordination and gait normal.  Skin: Skin is warm and dry. No rash noted. She is not diaphoretic. No pallor.  Psychiatric: Her speech is normal and behavior is normal. Judgment and thought content normal. Her mood appears anxious. Her affect is labile. Her affect is not blunt and not inappropriate. She does not exhibit a depressed mood.  Very mild cognitive impairment. PHQ-9 score=  15.  Nursing note and vitals reviewed.      Assessment & Plan:  Essential hypertension - Advised p[t to take 1/2  tablet every morning; her husband is checking her BP. Pt reports some dizziness upon standing so dose reduction may help alleviate that symptom. Plan: CBC with Differential/Platelet, BASIC METABOLIC PANEL WITH GFR  Sequelae of cerebrovascular disease- No deficits noted that require OT/PT intervention. To follow-up with Dr. Pearlean BrownieSethi; husband will call [facility] office to find out if pt can get appt time moved up or get put on the cancellation list.  Depression due to stroke - Trial SSRI- Sertraline 50 mg 1/2 tablet every morning for 1 week then increase to 1 tablet every morning. Plan: BASIC METABOLIC PANEL WITH GFR  Depression with anxiety - Alprazolam 0.25 mg 1 tablet as needed in the evening for sleep; pt advised to take this medication every night for the week then she can assess for improvement in her sleep habits. Plan: Thyroid Panel With TSH  Abnormal CBC - Plan: CBC with Differential/Platelet  Abnormal finding on examination of thyroid gland - Plan: Thyroid Panel With TSH, Vit D  25 hydroxy (rtn osteoporosis monitoring)   Meds ordered  this encounter  Medications  . sertraline (ZOLOFT) 50 MG tablet    Sig: Take 1 tablet (50 mg total) by mouth daily.    Dispense:  30 tablet    Refill:  3  . ALPRAZolam (XANAX) 0.25 MG tablet    Sig: Take 1 tablet in the evening for anxiety and for sleep.    Dispense:  30 tablet    Refill:  0

## 2014-10-15 NOTE — Telephone Encounter (Signed)
Please advise 

## 2014-10-16 NOTE — Telephone Encounter (Signed)
Pt should take medication as advised by the Cardiologist. Continue to monitor BP at home; pt should be seated in a relaxed position for at least 5 minutes prior to checking pressure.

## 2014-10-16 NOTE — Telephone Encounter (Signed)
Gave pt message.

## 2014-10-17 ENCOUNTER — Encounter: Payer: Self-pay | Admitting: Neurology

## 2014-10-17 ENCOUNTER — Ambulatory Visit (INDEPENDENT_AMBULATORY_CARE_PROVIDER_SITE_OTHER): Payer: BC Managed Care – PPO | Admitting: Neurology

## 2014-10-17 VITALS — BP 133/89 | HR 94 | Temp 97.1°F | Ht 61.0 in | Wt 214.5 lb

## 2014-10-17 DIAGNOSIS — I6300 Cerebral infarction due to thrombosis of unspecified precerebral artery: Secondary | ICD-10-CM | POA: Diagnosis not present

## 2014-10-17 DIAGNOSIS — E669 Obesity, unspecified: Secondary | ICD-10-CM

## 2014-10-17 DIAGNOSIS — E785 Hyperlipidemia, unspecified: Secondary | ICD-10-CM | POA: Diagnosis not present

## 2014-10-17 DIAGNOSIS — I10 Essential (primary) hypertension: Secondary | ICD-10-CM

## 2014-10-17 MED ORDER — ALPRAZOLAM 0.25 MG PO TABS: ORAL_TABLET | ORAL | Status: DC

## 2014-10-17 MED ORDER — CLOPIDOGREL BISULFATE 75 MG PO TABS: 75.0000 mg | ORAL_TABLET | Freq: Every day | ORAL | Status: DC

## 2014-10-17 MED ORDER — ATORVASTATIN CALCIUM 20 MG PO TABS: 20.0000 mg | ORAL_TABLET | Freq: Every day | ORAL | Status: DC

## 2014-10-17 MED ORDER — LISINOPRIL-HYDROCHLOROTHIAZIDE 20-12.5 MG PO TABS: 0.5000 | ORAL_TABLET | Freq: Every day | ORAL | Status: DC

## 2014-10-17 NOTE — Patient Instructions (Addendum)
Overall you are doing fairly well but I do want to suggest a few things today:   Remember to drink plenty of fluid, eat healthy meals and do not skip any meals. Try to eat protein with a every meal and eat a healthy snack such as fruit or nuts in between meals. Try to keep a regular sleep-wake schedule and try to exercise daily, particularly in the form of walking, 20-30 minutes a day, if you can.   As far as your medications are concerned, I would like to suggest: Refills given.  Referral to Dietician  Our phone number is [phone]. We also have an after hours call service for urgent matters and there is a physician on-call for urgent questions. For any emergencies you know to call 911 or go to the nearest emergency room

## 2014-10-17 NOTE — Progress Notes (Addendum)
[facility]    Provider:  Dr Lucia Gaskins Referring Provider: Maurice March, MD Primary Care Physician:  Dow Adolph, MD  CC:  Hospital follow up   HPI:  Shawna Waters is a 59 y.o. female here as a referral from Dr. Audria Nine for hospital follow up after admission for stroke workup. PMHx HTN, HLD, Obesity. She was admitted on 3/3  for acute onset right-sided weakness and numbness, diagnosed with a TIA. She was seen on 3/7 again in the ED for similar symptoms and sent home.  She was admitted on 3/8 after another episode and CT showed  probable acute left medial temporal occipital ischemic infarction.Marland Kitchen She was not on aspirin or stroke prevention before March 3rd, March 3rd started taking the aspirin. She was changed to Plavix. She is now on Lipitor. Symptoms are getting better. The arm is still a little weak, but she is improved. She feels light headed. Walks three times a day. She feels lightheaded when she gets up. Dizziness better since decreasing the blood pressure medication. She is struggling with anxiety and depression.  Symptoms started acutely. Currently she has minimal symptoms. Not interfering with her ADLs or IADLs, and no other associated symptoms except for the dizziness that probably is due to low BP. She is working with pcp on this.      Reviewed notes, labs and imaging from outside physicians, which showed: Patient was admitted on 09/27/2014 for , intermittent episodes of right arm weakness/heaviness sensation, numbness, lightheadedness and blurred vision. 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 Diagnosed with a TIA and discharged on ASA, BP meds (due to HTN), . She was seen again in the emergency room with similar symptoms on 10/01/2014. No focal deficits were noted and she was sent home.  She had several episodes of right-sided numbness involving ascending arm and subsequently  involving right leg. She presented again on 3/8. At that time, CT showed probable acute left medial temporal occipital ischemic infarction. Admitted for workup. TEE and echo did not show thrombus.  HgbA1c 5.6. LDL 139. She was switched to plavix. Loop recorder implanted as left PCA flow signal thought to be due to embolus.  MR Brain (personally reviewed images, agree with findings) IMPRESSION: 1. Acute nonhemorrhagic infarct within the inferomedial left temporal lobe is confirmed. 2. At least 3 additional punctate nonhemorrhagic infarcts are evident within the posterior left temporal lobe and occipital pole. 3. Abnormal flow signal in the proximal left PCA compatible with the area of high-grade stenosis on the CTA.  Review of Systems: Patient complains of symptoms per HPI as well as the following symptoms: dizziness, thirst, anxiety. Pertinent negatives per HPI. All others negative.   History   Social History  . Marital Status: Married    Spouse Name: N/A  . Number of Children: 1  . Years of Education: College   Occupational History  . Not on file.   Social History Main Topics  . Smoking status: Never Smoker   . Smokeless tobacco: Not on file  . Alcohol Use: No  . Drug Use: No  . Sexual Activity: Not on file   Other Topics Concern  . Not on file   Social History Narrative   Lives at home with husband.   Right handed.   Caffeine use: none    Family History  Problem Relation Age of Onset  . CAD Father   . Diabetes Maternal Grandmother  Past Medical History  Diagnosis Date  . Hypertension   . Stroke     Past Surgical History  Procedure Laterality Date  . Abdominal hysterectomy    . Loop recorder implant N/A 10/04/2014    Procedure: LOOP RECORDER IMPLANT;  Surgeon: Hillis Range, MD;  Location: [facility] CATH LAB;  Service: Cardiovascular;  Laterality: N/A;  . Tee without cardioversion N/A 10/04/2014    Procedure: TRANSESOPHAGEAL ECHOCARDIOGRAM (TEE);  Surgeon: Laurey Morale, MD;  Location: [facility] ENDOSCOPY;  Service: Cardiovascular;  Laterality: N/A;    Current Outpatient Prescriptions  Medication Sig Dispense Refill  . ALPRAZolam (XANAX) 0.25 MG tablet Take 1 tablet in the evening for anxiety and for sleep. 30 tablet 3  . atorvastatin (LIPITOR) 20 MG tablet Take 1 tablet (20 mg total) by mouth daily at 6 PM. 30 tablet 6  . clopidogrel (PLAVIX) 75 MG tablet Take 1 tablet (75 mg total) by mouth daily. 30 tablet 6  . lisinopril-hydrochlorothiazide (PRINZIDE,ZESTORETIC) 20-12.5 MG per tablet Take 0.5 tablets by mouth daily. 30 tablet 6  . sertraline (ZOLOFT) 50 MG tablet Take 1 tablet (50 mg total) by mouth daily. (Patient taking differently: Take 50 mg by mouth daily. 0.5 tablet for one week starting 10/11/14. Start whole tablet on 10/18/14.) 30 tablet 3   No current facility-administered medications for this visit.    Allergies as of 10/17/2014 - Review Complete 10/12/2014  Allergen Reaction Noted  . Codeine Other (See Comments) 09/27/2014    Vitals: BP 133/89 mmHg  Pulse 94  Temp(Src) 97.1 F (36.2 C)  Ht  (1.549 m)  Wt 214 lb 8 oz (97.297 kg)  BMI 40.55 kg/m2 Last Weight:  Wt Readings from Last 1 Encounters:  10/17/14 214 lb 8 oz (97.297 kg)   Last Height:   Ht Readings from Last 1 Encounters:  10/17/14  (1.549 m)   Physical exam: Exam: Gen: NAD, conversant, well nourised, obese, well groomed                     CV: RRR, no MRG. No Carotid Bruits. No peripheral edema, warm, nontender Eyes: Conjunctivae clear without exudates or hemorrhage  Neuro: Detailed Neurologic Exam  Speech:    Speech is normal; fluent and spontaneous with normal comprehension.  Cognition:    The patient is oriented to person, place, and time;     recent and remote memory intact;     language fluent;     normal attention, concentration,     fund of knowledge Cranial Nerves:    The pupils are equal, round, and reactive to light. The fundi are normal  and spontaneous venous pulsations are present. Visual fields are full to finger confrontation. Extraocular movements are intact. Trigeminal sensation is intact and the muscles of mastication are normal. The face is symmetric. The palate elevates in the midline. Hearing intact. Voice is normal. Shoulder shrug is normal. The tongue has normal motion without fasciculations.   Coordination:    Normal finger to nose and heel to shin. Normal rapid alternating movements.   Gait:    Heel-toe and tandem gait are normal.   Motor Observation:    No asymmetry, no atrophy, and no involuntary movements noted. Tone:    Normal muscle tone.    Posture:    Posture is normal. normal erect    Strength:    Strength is V/V in the upper and lower limbs.      Sensation: intact to LT  Reflex Exam:  DTR's:    Deep tendon reflexes in the upper and lower extremities are normal bilaterally.   Toes:    The toes are downgoing bilaterally.   Clonus:    Clonus is absent.       Assessment/Plan:  Ms. Shawna Waters is a 59 y.o. 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   Stroke: Dominant left PCA branch (temporal & occipital) infarcts secondary to L PCA stenosis without other intracranial atherosclerosis. Stenosis felt to be embolic secondary to unknown source  Resultant Memory deficits, right hemiparesis improved, right paresthesia  MRI Acute nonhemorrhagic infarct within the inferomedial left temporal lobe is confirmed. 2. At least 3 additional punctate nonhemorrhagic infarcts are evident within the posterior left temporal lobe and occipital pole. 3. Abnormal flow signal in the proximal left PCA compatible with the area of high-grade stenosis on the CTA.  MRA Unremarkable except for L PCA stenosis  Carotid Doppler. No significant stenosis  2D Echo and TEE. No source of embolus. Loop recorder  implanted.  HgbA1c 5.6, at goal  Changed to Plavix 75mg  daily  Ongoing aggressive stroke risk factor management  Exercise encouraged with focus on cardiac; weight loss recommended.  LDL 139, goal < 70. Continue Lipitor  Close management with pcp to control vascular risk factors  Dizziness is likely due to blood pressure medication and possibly some dehydration, orthostatic hypotension- f/u with pcp for medication management, advised to increase fluids.   Naomie DeanAntonia Ahern, MD  [facility] [address] [place], [place] [id number]  Phone [phone] Fax [phone]

## 2014-10-25 NOTE — Addendum Note (Signed)
Addended by: Naomie DeanAHERN, Joie Hipps B on: 10/25/2014 09:55 PM   Modules accepted: Level of Service

## 2014-10-26 ENCOUNTER — Encounter: Payer: Self-pay | Admitting: Internal Medicine

## 2014-10-31 ENCOUNTER — Ambulatory Visit (INDEPENDENT_AMBULATORY_CARE_PROVIDER_SITE_OTHER): Payer: BC Managed Care – PPO | Admitting: Family Medicine

## 2014-10-31 ENCOUNTER — Encounter: Payer: Self-pay | Admitting: Family Medicine

## 2014-10-31 VITALS — BP 122/70 | HR 75 | Temp 98.2°F | Resp 16 | Ht 61.75 in | Wt 211.8 lb

## 2014-10-31 DIAGNOSIS — F418 Other specified anxiety disorders: Secondary | ICD-10-CM | POA: Diagnosis not present

## 2014-10-31 DIAGNOSIS — I10 Essential (primary) hypertension: Secondary | ICD-10-CM

## 2014-10-31 DIAGNOSIS — E559 Vitamin D deficiency, unspecified: Secondary | ICD-10-CM | POA: Diagnosis not present

## 2014-10-31 DIAGNOSIS — E785 Hyperlipidemia, unspecified: Secondary | ICD-10-CM

## 2014-10-31 LAB — BASIC METABOLIC PANEL
BUN: 11 mg/dL (ref 6–23)
CALCIUM: 9.7 mg/dL (ref 8.4–10.5)
CO2: 26 mEq/L (ref 19–32)
Chloride: 104 mEq/L (ref 96–112)
Creat: 0.9 mg/dL (ref 0.50–1.10)
Glucose, Bld: 102 mg/dL — ABNORMAL HIGH (ref 70–99)
Potassium: 4.4 mEq/L (ref 3.5–5.3)
SODIUM: 139 meq/L (ref 135–145)

## 2014-10-31 LAB — LDL CHOLESTEROL, DIRECT: LDL DIRECT: 69 mg/dL

## 2014-10-31 NOTE — Patient Instructions (Addendum)
Vitamin D Deficiency  Not having enough vitamin D is called a deficiency. Your body needs this vitamin to keep your bones strong and healthy. Having too little of it can make your bones soft or can cause other health problems.  HOME CARE  Take all vitamins, herbs, or nutrition drinks (supplements) as told by your doctor.  Have your blood tested 2 months after taking vitamins, herbs, or nutrition drinks.  Eat foods that have vitamin D. This includes:  Dairy products, cereals, or juices with added vitamin D. Check the label.  Fatty fish like salmon or trout.  Eggs.  Oysters.  Do not use tanning beds.  Stay at a healthy weight. Lose weight if needed.  Keep all doctor visits as told. GET HELP IF:  You have questions.  You continue to have problems.  You feel sick to your stomach (nauseous) or throw up (vomit).  You cannot go poop (constipated).  You feel confused.  You have severe belly (abdominal) or back pain. MAKE SURE YOU:  Understand these instructions.  Will watch your condition.  Will get help right away if you are not doing well or get worse. Document Released: 07/02/2011 Document Revised: 11/07/2012 Document Reviewed: 07/02/2011 [facility] Patient Information 2015 [employer] [place] [place]. This information is not intended to replace advice given to you by your health care provider. Make sure you discuss any questions you have with your health care provider.   Get an over-the-counter Vitamin D supplement 2000 units and take 1 capsule daily. NATURE MADE is a good brand. Also, a good multivitamin for women over age 58 is a good idea; make sure it does not have iron it in. Stay well hydrated and keep walking (in search of your new normal). It can be an exciting challenge!

## 2014-10-31 NOTE — Progress Notes (Signed)
Subjective:    Patient ID: Shawna Waters, female    DOB: [birth year], 59 y.o.   MRN: [id number]  HPI  This 59 y.o. Female has HTN and is s/p TIA 4 weeks ago. She feels much better w/ less dizziness and "tingling" sensations in her R arm. She is compliant w/ BP medication reduction, taking lisinopril- HCTZ 20-12.5 mg  1/2 tablet daily. Home monitoring : AM 106-145/ 59-90;  PM 98- 138/ 53-78.   She walks 1 mile, 3 times daily w/o difficulty. [facility]. Naomie DeanAntonia Ahern (10/17/14); no medication changes. Pt reassured that the sensations she experiences will gradually diminish w/ time. She will continue on Plavix for the next 12 months, at least.  Depression/anxiety- Pt feels sertraline 50 mg every morning is effective. She has adverse effects and is sleeping through the night. Alprazolam is not required for sleep. Nutrition is better and pt is starting to get out more. She and her husband are going to the beach this weekend.  HLD- Pt is compliant w/ statin medication; she does not report myalgias, GI upset of back pain. There has been no cognitive decline.  Vitamin D def- Pt was notified via MyChart about lab results. She has checked results or comments because her son has her password. I reminded her abut Vitamin D supplementation.  Patient Active Problem List   Diagnosis Date Noted  . HLD (hyperlipidemia)   . Acute ischemic stroke 10/03/2014  . Stroke 10/03/2014  . Hyperlipidemia LDL goal <100 10/03/2014  . TIA (transient ischemic attack) 09/27/2014  . HTN (hypertension) 09/27/2014  . Overweight   . Hyperglycemia   . Severe obesity (BMI >= 40)     Prior to Admission medications   Medication Sig Start Date End Date Taking? Authorizing Provider  atorvastatin (LIPITOR) 20 MG tablet Take 1 tablet (20 mg total) by mouth daily at 6 PM. 10/17/14  Yes Anson FretAntonia Waters Ahern, MD  clopidogrel (PLAVIX) 75 MG tablet Take 1 tablet (75 mg total) by mouth daily. 10/17/14  Yes  Anson FretAntonia Waters Ahern, MD  lisinopril-hydrochlorothiazide (PRINZIDE,ZESTORETIC) 20-12.5 MG per tablet Take 0.5 tablets by mouth daily. 10/17/14  Yes Anson FretAntonia Waters Ahern, MD  sertraline (ZOLOFT) 50 MG tablet Take 1 tablet (50 mg total) by mouth daily. Patient taking differently: Take 50 mg by mouth daily. 0.5 tablet for one week starting 10/11/14. Start whole tablet on 10/18/14. 10/10/14  Yes Maurice MarchBarbara Waters Guyla Bless, MD  ALPRAZolam Prudy Feeler(XANAX) 0.25 MG tablet Take 1 tablet in the evening for anxiety and for sleep. Patient not taking: Reported on 10/31/2014 10/17/14   Anson FretAntonia Waters Ahern, MD    [facility] reviewed.   Review of Systems  Constitutional: Positive for activity change. Negative for diaphoresis, fatigue and unexpected weight change.  HENT: Negative.   Eyes: Negative.   Respiratory: Negative for cough, chest tightness and shortness of breath.   Cardiovascular: Negative for chest pain and palpitations.  Gastrointestinal: Negative.   Musculoskeletal: Negative.   Neurological: Negative for dizziness, syncope, facial asymmetry, weakness, numbness and headaches.  Psychiatric/Behavioral: Negative for confusion, sleep disturbance, self-injury and dysphoric mood. The patient is nervous/anxious.        PHQ-9 result today= 0.       Objective:   Physical Exam  Constitutional: She is oriented to person, place, and time. She appears well-developed and well-nourished. No distress.  HENT:  Head: Normocephalic and atraumatic.  Right Ear: External ear normal.  Left Ear: External ear  normal.  Nose: Nose normal.  Mouth/Throat: Oropharynx is clear and moist.  Eyes: Conjunctivae and EOM are normal. No scleral icterus.  Neck: Neck supple.  Cardiovascular: Normal rate and regular rhythm.   Pulmonary/Chest: Effort normal. No respiratory distress.  Musculoskeletal: Normal range of motion.  Neurological: She is alert and oriented to person, place, and time. No cranial nerve deficit. Coordination normal.  Skin:  Skin is warm and dry. She is not diaphoretic. No pallor.  Psychiatric: She has a normal mood and affect. Her behavior is normal. Judgment and thought content normal.  Nursing note and vitals reviewed.     Assessment & Plan:  Essential hypertension -Stable on current medication; continue lisinopril- HCTZ 20-12.5 mg  1/2 tab every morning. Monitor.  Plan: Basic metabolic panel  Depression with anxiety- Stable and doing well on sertraline 50 mg 1 tab daily.  HLD (hyperlipidemia) - Plan: LDL Cholesterol, Direct  Vitamin D deficiency- Get OTC Vitamin D3 2000 units and take daily. Continue daily walks to get sun exposure.  All medications have been refilled by Dr. Lucia Gaskins.

## 2014-11-01 ENCOUNTER — Encounter: Payer: Self-pay | Admitting: Skilled Nursing Facility1

## 2014-11-01 ENCOUNTER — Encounter: Payer: BC Managed Care – PPO | Attending: Neurology | Admitting: Skilled Nursing Facility1

## 2014-11-01 VITALS — Ht 61.0 in | Wt 211.0 lb

## 2014-11-01 DIAGNOSIS — E663 Overweight: Secondary | ICD-10-CM

## 2014-11-01 DIAGNOSIS — E669 Obesity, unspecified: Secondary | ICD-10-CM | POA: Diagnosis not present

## 2014-11-01 DIAGNOSIS — Z8673 Personal history of transient ischemic attack (TIA), and cerebral infarction without residual deficits: Secondary | ICD-10-CM | POA: Diagnosis not present

## 2014-11-01 DIAGNOSIS — Z6839 Body mass index (BMI) 39.0-39.9, adult: Secondary | ICD-10-CM | POA: Insufficient documentation

## 2014-11-01 DIAGNOSIS — Z713 Dietary counseling and surveillance: Secondary | ICD-10-CM | POA: Diagnosis not present

## 2014-11-01 NOTE — Progress Notes (Signed)
  Medical Nutrition Therapy:  Appt start time: 0800 end time:  0900.   Assessment:  Primary concerns today: Referred for obesity. Pt states her doctor Suggested she come to a dietitian for a 3g sodium diet. Pt states she has started eating healthier after her stroke for health. Pts appetite is getting better since her stroke. Pt states she has lost 17 pounds since the stroke. Pt used to drink sweet tea but has stopped since her stroke. Pts labs: glucose 102, GFR 54, and Creatinine 1.13. Pt was accompanied by her sister. The recommendations for a low sodium diet are 1.5 grams per day and the recommendation for a normal individual is 2.5 grams per day of sodium. Pts sodium level is WNL (139).  Preferred Learning Style:   No preference indicated   Learning Readiness:   Change in progress   MEDICATIONS: See List   DIETARY INTAKE:  Usual eating pattern includes 3 meals and 2-3 snacks per day.  Everyday foods include none stated.  Avoided foods include none stated.    24-hr recall:  B ( AM): half a bowl of grits-----cereal (honey nut cheerios) Snk ( AM):  Fruit and banana L ( PM): leftovers (chicken strip on flour tortilla) Snk ( PM): almonds with chocolate D ( PM): salad with grilled chicken strips----slaw and potatoes Snk ( PM): fruit and banana Beverages: water  Usual physical activity: 3 times a day (1.2 miles) 7 days a week  Estimated energy needs: 1600 calories 180 g carbohydrates 120 g protein 44 g fat  Progress Towards Goal(s):  In progress.   Nutritional Diagnosis:  Graham-3.3 Overweight/obesity As related to knowledge deficit of a properly balanced diet.  As evidenced by pt report and BMI 39.87.    Intervention:  Nutrition counseling for obesity. Dietitian educated pt on a properly portioned balanced/varied diet, what foods have sodium, drink plenty of water, eat fish 2-3 times a week, and being in tune with her own body.   Teaching Method Utilized:   Visual Auditory  Handouts given during visit include:  MyPlate  Weight Loss strategies  Barriers to learning/adherence to lifestyle change: previous diagnosis of stroke.  Demonstrated degree of understanding via:  Teach Back   Monitoring/Evaluation:  Dietary intake, exercise, and body weight prn.

## 2014-11-02 ENCOUNTER — Ambulatory Visit (INDEPENDENT_AMBULATORY_CARE_PROVIDER_SITE_OTHER): Payer: BC Managed Care – PPO | Admitting: *Deleted

## 2014-11-02 DIAGNOSIS — G459 Transient cerebral ischemic attack, unspecified: Secondary | ICD-10-CM | POA: Diagnosis not present

## 2014-11-06 NOTE — Progress Notes (Signed)
Loop recorder 

## 2014-11-12 ENCOUNTER — Ambulatory Visit (INDEPENDENT_AMBULATORY_CARE_PROVIDER_SITE_OTHER): Payer: BC Managed Care – PPO | Admitting: Internal Medicine

## 2014-11-12 ENCOUNTER — Encounter: Payer: Self-pay | Admitting: Internal Medicine

## 2014-11-12 VITALS — BP 132/88 | HR 92 | Ht 61.75 in | Wt 211.4 lb

## 2014-11-12 DIAGNOSIS — I639 Cerebral infarction, unspecified: Secondary | ICD-10-CM | POA: Diagnosis not present

## 2014-11-12 LAB — MDC_IDC_ENUM_SESS_TYPE_INCLINIC
MDC IDC SESS DTM: 20160418091953
MDC IDC SET ZONE DETECTION INTERVAL: 4500 ms
Zone Setting Detection Interval: 2000 ms
Zone Setting Detection Interval: 350 ms

## 2014-11-12 NOTE — Progress Notes (Signed)
PCP: Dow AdolphMCPHERSON,BARBARA, MD  Shawna Waters is a 59 y.o. female who presents today for routine electrophysiology followup.  She is s/p ILR placement for cryptogenic stroke protocol.  She is followed by Dr Lucia GaskinsAhern with neurology and is making good progress.  She has mild soreness at the site of her LINQ but it looks good today.  No arrhythmias detected.  She is otherwise doing well.  Today, she denies symptoms of palpitations, chest pain, shortness of breath,  lower extremity edema, dizziness, presyncope, or syncope.    Past Medical History  Diagnosis Date  . Hypertension   . Stroke    Past Surgical History  Procedure Laterality Date  . Abdominal hysterectomy    . Loop recorder implant N/A 10/04/2014    Procedure: LOOP RECORDER IMPLANT;  Surgeon: Hillis RangeJames Sami Froh, MD;  [facility];  Service: Cardiovascular;  Laterality: N/A;  . Tee without cardioversion N/A 10/04/2014    Procedure: TRANSESOPHAGEAL ECHOCARDIOGRAM (TEE);  Surgeon: Laurey Moralealton S McLean, MD;  [facility];  Service: Cardiovascular;  Laterality: N/A;    ROS- all systems are reviewed and negative except as per HPI above  Current Outpatient Prescriptions  Medication Sig Dispense Refill  . atorvastatin (LIPITOR) 20 MG tablet Take 1 tablet (20 mg total) by mouth daily at 6 PM. 30 tablet 6  . Cholecalciferol (VITAMIN D) 2000 UNITS tablet Take 2,000 Units by mouth daily.    . clopidogrel (PLAVIX) 75 MG tablet Take 1 tablet (75 mg total) by mouth daily. 30 tablet 6  . lisinopril-hydrochlorothiazide (PRINZIDE,ZESTORETIC) 20-12.5 MG per tablet Take 0.5 tablets by mouth daily. 30 tablet 6  . sertraline (ZOLOFT) 50 MG tablet Take 1 tablet (50 mg total) by mouth daily. (Patient taking differently: Take 50 mg by mouth daily. 0.5 tablet for one week starting 10/11/14. Start whole tablet on 10/18/14.) 30 tablet 3   No current facility-administered medications for this visit.    Physical Exam: Filed Vitals:   11/12/14 0753  BP: 132/88   Pulse: 92  Height: 5' 1.75" (1.568 m)  Weight: 211 lb 6.4 oz (95.89 kg)    GEN- The patient is well appearing, alert and oriented x 3 today.   Head- normocephalic, atraumatic Eyes-  Sclera clear, conjunctiva pink Ears- hearing intact Oropharynx- clear Lungs- Clear to ausculation bilaterally, normal work of breathing Chest- ILR site is well healed Heart- Regular rate and rhythm, no murmurs, rubs or gallops, PMI not laterally displaced GI- soft, NT, ND, + BS Extremities- no clubbing, cyanosis, or edema  Pacemaker interrogation- reviewed in detail today,  See PACEART report  Assessment and Plan:  1. Cryptogenic stroke Normal ILR function See Pace Art report No arrhythmias detected  Will follow with carelink and see in the office as needed Device clinic staff discussed MDT transmitter/ remote monitoring details at length

## 2014-11-12 NOTE — Patient Instructions (Signed)
Your physician recommends that you schedule a follow-up appointment as needed with Dr Allred   

## 2014-11-26 ENCOUNTER — Encounter: Payer: BC Managed Care – PPO | Admitting: Internal Medicine

## 2014-12-03 ENCOUNTER — Ambulatory Visit (INDEPENDENT_AMBULATORY_CARE_PROVIDER_SITE_OTHER): Payer: BC Managed Care – PPO | Admitting: *Deleted

## 2014-12-03 DIAGNOSIS — I639 Cerebral infarction, unspecified: Secondary | ICD-10-CM | POA: Diagnosis not present

## 2014-12-04 LAB — CUP PACEART REMOTE DEVICE CHECK: MDC IDC SESS DTM: 20160510135850

## 2014-12-07 NOTE — Progress Notes (Signed)
Loop recorder 

## 2014-12-26 LAB — CUP PACEART REMOTE DEVICE CHECK: Date Time Interrogation Session: 20160601140207

## 2014-12-27 ENCOUNTER — Encounter: Payer: Self-pay | Admitting: Internal Medicine

## 2015-01-02 ENCOUNTER — Ambulatory Visit (INDEPENDENT_AMBULATORY_CARE_PROVIDER_SITE_OTHER): Payer: BC Managed Care – PPO | Admitting: *Deleted

## 2015-01-02 DIAGNOSIS — I639 Cerebral infarction, unspecified: Secondary | ICD-10-CM

## 2015-01-03 ENCOUNTER — Encounter: Payer: Self-pay | Admitting: *Deleted

## 2015-01-04 NOTE — Progress Notes (Signed)
Loop recorder 

## 2015-01-09 ENCOUNTER — Encounter: Payer: Self-pay | Admitting: Internal Medicine

## 2015-01-16 LAB — CUP PACEART REMOTE DEVICE CHECK: Date Time Interrogation Session: 20160622121825

## 2015-01-21 ENCOUNTER — Encounter: Payer: Self-pay | Admitting: Neurology

## 2015-01-21 ENCOUNTER — Ambulatory Visit (INDEPENDENT_AMBULATORY_CARE_PROVIDER_SITE_OTHER): Payer: BC Managed Care – PPO | Admitting: Neurology

## 2015-01-21 VITALS — BP 138/86 | HR 70 | Ht 61.75 in | Wt 207.2 lb

## 2015-01-21 DIAGNOSIS — R488 Other symbolic dysfunctions: Secondary | ICD-10-CM

## 2015-01-21 DIAGNOSIS — IMO0002 Reserved for concepts with insufficient information to code with codable children: Secondary | ICD-10-CM

## 2015-01-21 DIAGNOSIS — I639 Cerebral infarction, unspecified: Secondary | ICD-10-CM | POA: Diagnosis not present

## 2015-01-21 DIAGNOSIS — I69320 Aphasia following cerebral infarction: Secondary | ICD-10-CM

## 2015-01-21 DIAGNOSIS — R482 Apraxia: Secondary | ICD-10-CM

## 2015-01-21 NOTE — Patient Instructions (Addendum)
Overall you are doing fairly well but I do want to suggest a few things today:   Remember to drink plenty of fluid, eat healthy meals and do not skip any meals. Try to eat protein with a every meal and eat a healthy snack such as fruit or nuts in between meals. Try to keep a regular sleep-wake schedule and try to exercise daily, particularly in the form of walking, 20-30 minutes a day, if you can.   As far as your medications are concerned, I would like to suggest: continue current medications  As far as diagnostic testing: MRI of the brain, Speech therapy  I would like to see you back in 3 months, sooner if we need to. Please call us with any interim questions, concerns, problems, updates or refill requests.   Please also call us for any test results so we can go over those with you on the phone.  My clinical assistant and will answer any of your questions and relay your messages to me and also relay most of my messages to you.   Our phone number is [phone]. We also have an after hours call service for urgent matters and there is a physician on-call for urgent questions. For any emergencies you know to call 911 or go to the nearest emergency room

## 2015-01-21 NOTE — Progress Notes (Signed)
[facility]    Provider:  Dr Lucia GaskinsAhern Referring Provider: Maurice MarchMcPherson, Barbara B, MD Primary Care Physician:  Dow AdolphMCPHERSON,BARBARA, MD  CC: Hospital follow up   Interval history 01/21/2015: She is here with husband. She is having issues getting words out, stuttering, word-finding difficulty. This is new, she didn't have this previously. Endorses compliance with medications. Her speech difficulties started a month ago and they thought it was late effect due to the stroke but thought it was odd for new symptoms after stroke. Acute onset of word-finding difficulty and stuttering. No other new focal neurologic symptoms  Initial visit 10/17/2014: Shawna Waters is a 59 y.o. female here as a referral from Dr. Audria NineMcPherson for hospital follow up after admission for stroke workup. PMHx HTN, HLD, Obesity. She was admitted on 3/3 for acute onset right-sided weakness and numbness, diagnosed with a TIA. She was seen on 3/7 again in the ED for similar symptoms and sent home. She was admitted on 3/8 after another episode and CT showed probable acute left medial temporal occipital ischemic infarction.Marland Kitchen. She was not on aspirin or stroke prevention before March 3rd, March 3rd started taking the aspirin. She was changed to Plavix. She is now on Lipitor. Symptoms are getting better. The arm is still a little weak, but she is improved. She feels light headed. Walks three times a day. She feels lightheaded when she gets up. Dizziness better since decreasing the blood pressure medication. She is struggling with anxiety and depression. Symptoms started acutely. Currently she has minimal symptoms. Not interfering with her ADLs or IADLs, and no other associated symptoms except for the dizziness that probably is due to low BP. She is working with pcp on this.      Reviewed notes, labs and imaging from outside physicians, which showed: Patient was admitted on 09/27/2014 for , intermittent episodes of right arm  weakness/heaviness sensation, numbness, lightheadedness and blurred vision. 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 Diagnosed with a TIA and discharged on ASA, BP meds (due to HTN), . She was seen again in the emergency room with similar symptoms on 10/01/2014. No focal deficits were noted and she was sent home. She had several episodes of right-sided numbness involving ascending arm and subsequently involving right leg. She presented again on 3/8. At that time, CT showed probable acute left medial temporal occipital ischemic infarction. Admitted for workup. TEE and echo did not show thrombus. HgbA1c 5.6. LDL 139. She was switched to plavix. Loop recorder implanted as left PCA flow signal thought to be due to embolus.  MR Brain (personally reviewed images, agree with findings) IMPRESSION: 1. Acute nonhemorrhagic infarct within the inferomedial left temporal lobe is confirmed. 2. At least 3 additional punctate nonhemorrhagic infarcts are evident within the posterior left temporal lobe and occipital pole. 3. Abnormal flow signal in the proximal left PCA compatible with the area of high-grade stenosis on the CTA. Review of Systems: Patient complains of symptoms per HPI as well as the following symptoms: Fatigue, speech difficulty. Pertinent negatives per HPI. All others negative.   History   Social History  . Marital Status: Married    Spouse Name: N/A  . Number of Children: 1  . Years of Education: College   Occupational History  . Not on file.   Social History Main Topics  . Smoking status: Never Smoker   . Smokeless tobacco: Not on file  . Alcohol Use: No  . Drug  Use: No  . Sexual Activity: Not on file   Other Topics Concern  . Not on file   Social History Narrative   Lives at home with husband.   Right handed.   Caffeine use: none    Family History  Problem Relation Age of Onset  . CAD Father     . Diabetes Maternal Grandmother   . Hypertension Other     Past Medical History  Diagnosis Date  . Hypertension   . Stroke   . Encephalitis     Past Surgical History  Procedure Laterality Date  . Abdominal hysterectomy    . Loop recorder implant N/A 10/04/2014    Procedure: LOOP RECORDER IMPLANT;  Surgeon: Hillis Range, MD;  Location: [facility] CATH LAB;  Service: Cardiovascular;  Laterality: N/A;  . Tee without cardioversion N/A 10/04/2014    Procedure: TRANSESOPHAGEAL ECHOCARDIOGRAM (TEE);  Surgeon: Laurey Morale, MD;  Location: [facility] ENDOSCOPY;  Service: Cardiovascular;  Laterality: N/A;    Current Outpatient Prescriptions  Medication Sig Dispense Refill  . atorvastatin (LIPITOR) 20 MG tablet Take 1 tablet (20 mg total) by mouth daily at 6 PM. 30 tablet 6  . Cholecalciferol (VITAMIN D) 2000 UNITS tablet Take 2,000 Units by mouth daily.    . clopidogrel (PLAVIX) 75 MG tablet Take 1 tablet (75 mg total) by mouth daily. 30 tablet 6  . lisinopril-hydrochlorothiazide (PRINZIDE,ZESTORETIC) 20-12.5 MG per tablet Take 0.5 tablets by mouth daily. 30 tablet 6  . sertraline (ZOLOFT) 50 MG tablet Take 1 tablet (50 mg total) by mouth daily. (Patient taking differently: Take 50 mg by mouth daily. 0.5 tablet for one week starting 10/11/14. Start whole tablet on 10/18/14.) 30 tablet 3   No current facility-administered medications for this visit.    Allergies as of 01/21/2015 - Review Complete 11/12/2014  Allergen Reaction Noted  . Codeine Other (See Comments) 09/27/2014    Vitals: BP 138/86 mmHg  Pulse 70  Ht 5' 1.75" (1.568 m)  Wt 207 lb 3.2 oz (93.985 kg)  BMI 38.23 kg/m2 Last Weight:  Wt Readings from Last 1 Encounters:  01/21/15 207 lb 3.2 oz (93.985 kg)   Last Height:   Ht Readings from Last 1 Encounters:  01/21/15 5' 1.75" (1.568 m)  Neuro: Detailed Neurologic Exam  Speech: New difficulty with speech, apraxia Cognition:  The patient is oriented to person, place, and time;    Cranial Nerves:  The pupils are equal, round, and reactive to light.  Visual fields are full to finger confrontation. Extraocular movements are intact. Trigeminal sensation is intact and the muscles of mastication are normal. The face is symmetric. The palate elevates in the midline. Hearing intact. Voice is normal. Shoulder shrug is normal. The tongue has normal motion without fasciculations.   Coordination:  Normal finger to nose and heel to shin. Normal rapid alternating movements.   Gait:  Heel-toe and tandem gait are normal.   Motor Observation:  No asymmetry, no atrophy, and no involuntary movements noted. Tone:  Normal muscle tone.   Posture:  Posture is normal. normal erect   Strength:  Strength is V/V in the upper and lower limbs.   Assessment/Plan: Ms. Shawna Waters is a 59 y.o. 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  Today she presents with new onset word finding difficulties and difficulty with speech. Feel we should repeat MRI of the brain to ensure no new  Stroke: Dominant  left PCA branch (temporal & occipital) infarcts secondary to L PCA stenosis without other intracranial atherosclerosis. Stenosis felt to be embolic secondary to unknown source  Resultant Memory deficits, right hemiparesis improved, right paresthesia  MRI Acute nonhemorrhagic infarct within the inferomedial left temporal lobe is confirmed. 2. At least 3 additional punctate nonhemorrhagic infarcts are evident within the posterior left temporal lobe and occipital pole. 3. Abnormal flow signal in the proximal left PCA compatible with the area of high-grade stenosis on the CTA.  MRA Unremarkable except for L PCA stenosis  Carotid Doppler. No significant stenosis  2D Echo and TEE. No source of embolus. Loop recorder implanted.  HgbA1c 5.6, at goal  Changed to Plavix   daily  Ongoing aggressive stroke risk factor management  Exercise encouraged with focus on cardiac; weight loss recommended.  LDL 139, goal < 70. Continue Lipitor  Close management with pcp to control vascular risk factors  Dizziness is likely due to blood pressure medication and possibly some dehydration, orthostatic hypotension- f/u with pcp for medication management, advised to increase fluids.    Naomie Dean, MD  [facility] [address] [place], [place] [address]  Phone [phone] Fax [phone]  A total of 30 minutes was spent face-to-face with this patient. Over half this time was spent on counseling patient on the new onset of word finding difficulties diagnosis and different diagnostic and therapeutic options available.

## 2015-01-24 ENCOUNTER — Ambulatory Visit: Payer: BC Managed Care – PPO | Attending: Neurology | Admitting: Speech Pathology

## 2015-01-24 DIAGNOSIS — I69390 Apraxia following cerebral infarction: Secondary | ICD-10-CM | POA: Insufficient documentation

## 2015-01-24 DIAGNOSIS — R482 Apraxia: Secondary | ICD-10-CM

## 2015-01-24 NOTE — Patient Instructions (Signed)
Speech exercises - do 5x each, x2-3/day SLOW BIG  SAY THE FOLLOWING- make every sound! Red leather, yellow leather  Big grocery buggy    Purple baby carriage    [place] [employer] Proper copper coffee pot Ripe purple cabbage Three free throws [place] [employer]ins Red Bulb, Blue Bulb Flash Message Dave dipped the dessert  [employer] Devils An Engineer for [employer] Five valve levers Six Thick Thistles Stick Double Bubble Gum Fat cows give milk [place] Golden Gophers Fat frogs flip freely Tuck Tommy into bed Get that game to Greg Freshly Fried Fat Fish Cinnamon aluminum linoleum Black bugs blood Lovely lemon linament Buckle that bracket  Tying Tape Takes Time A Shifty Salt Shaker   The gospel of Mark Shirts shrink, shells shouldn't [place] 49ers Take the tackle box Give me five flapjacks Fundamental relatives Call the cat "Buttercup" A calendar of [place] Four floors to cover Yellow oil ointment Fellow lovers of felines Catastrophe in [place] Plump plumbers' plums The church's chimes chimed Telling time until eleven Unique [place] A Three Toed Tree Toad Knapsack Strap Snap Rubber Baby Buggy Bumpers    

## 2015-01-24 NOTE — Therapy (Signed)
[facility] [address] [place], [place], [address] Phone: [phone]   Fax:  [phone]  Speech Language Pathology Evaluation  Patient Details  Name: Shawna Waters MRN: [id number] Date of Birth: [birth year] Referring Provider:  Anson Fret, MD  Encounter Date: 01/24/2015      End of Session - 01/24/15 1143    Activity Tolerance Patient tolerated treatment well      Past Medical History  Diagnosis Date  . Hypertension   . Stroke   . Encephalitis     Past Surgical History  Procedure Laterality Date  . Abdominal hysterectomy    . Loop recorder implant N/A 10/04/2014    Procedure: LOOP RECORDER IMPLANT;  Surgeon: Hillis Range, MD;  Location: [facility] CATH LAB;  Service: Cardiovascular;  Laterality: N/A;  . Tee without cardioversion N/A 10/04/2014    Procedure: TRANSESOPHAGEAL ECHOCARDIOGRAM (TEE);  Surgeon: Laurey Morale, MD;  Location: [facility] ENDOSCOPY;  Service: Cardiovascular;  Laterality: N/A;    There were no vitals filed for this visit.  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 No/denies            SLP Evaluation [facility] - 01/24/15 1114    SLP Visit Information   SLP Received On 01/24/15   Onset Date 10/02/2014   Medical Diagnosis CVA   Subjective   Patient/Family Stated Goal To talk faster   Prior Functional Status   Cognitive/Linguistic Baseline Within functional limits   Type of Home House    Lives With Spouse   Available Support Family;Friend(s)   Vocation Retired   IT consultant   Overall Cognitive Status Within Functional Limits for tasks assessed   Auditory Comprehension   Conversation Complex   EffectiveTechniques Extra processing time;Repetition   Reading Comprehension   Reading Status Within funtional limits   Expression   Primary Mode of Expression Verbal   Verbal Expression   Overall Verbal Expression Impaired   Initiation Impaired   Automatic Speech --  WFL   Repetition No impairment   Naming No impairment   Pragmatics No impairment   Written Expression   Dominant Hand Right   Written Expression Within Functional Limits   Motor Speech   Motor Planning Impaired   Level of Impairment Conversation   Motor Speech Errors Inconsistent;Groping for words   Effective Techniques Slow rate;Over-articulate;Pause           ADULT SLP TREATMENT - 01/24/15 1153    Cognitive-Linquistic Treatment   Skilled Treatment Initiated training of HEP for verbal motor planning. Pt performed with rare min cues/modeling. Trained pt in compensations for verbal motor planning with occassional min cueing and feed back. Pt verbalized compensations with supervision cues.           SLP Education - 01/24/15 1133    Education provided Yes   Education Details HEP, compensations for verbal motor planning   Person(s) Educated Patient   Methods Explanation;Demonstration;Verbal cues;Handout   Comprehension Verbalized understanding;Returned demonstration;Need further instruction            SLP Long Term Goals - 01/24/15 1157    SLP LONG TERM GOAL #1   Title Pt will demonstrate compensations for verbal apraxia during 10 minute mildly complex conversation with mod I   Time 6  Period Weeks   Status New   SLP LONG TERM GOAL #2   Title Pt will demonstrate less than 25% dysfluencies in simple conversation over 10 minutes with rare min A   Time 6   Period Weeks   Status New   SLP LONG TERM GOAL #3   Title Pt will perform HEP for verbal apraxia with mod I   Time 6   Period Weeks   Status New          Plan - 01/24/15 1144    Clinical Impression Statement Mrs. Flammia, a 59 y.o. female who suffered a CVA in 10/02/14 is referred for outpt ST due to c/o of dysfluent speech. Mrs. Gatlin reports that her speech  difficulties were present during her hsopitalization for the CVA, but have worsened since then.  Today, Mrs. Shanon Rosserage presents with inconsistent mild verbal motor planning impairment. Spontaneous speech/converssation is characterized by mild halting, groping and stutter.  Automatic speech, naming, repetition, oral reading of paragraphs are fluent and  [facility]. Pt reorts she slows down to help reduce her dysfluency.  She states that her speech is worse when she is stressed or tired. Conversational dysfluency is distracting to the listener, however Mrs. Fedewa is intelligible. I recommend SKilled ST to maximize verbal expression for improved QOL   Duration 1 week   Treatment/Interventions Functional tasks;Compensatory strategies;Patient/family education;Internal/external aids;SLP instruction and feedback  6 weeks   Potential to Achieve Goals Good   Consulted and Agree with Plan of Care Patient        Problem List Patient Active Problem List   Diagnosis Date Noted  . HLD (hyperlipidemia)   . Acute ischemic stroke 10/03/2014  . Stroke 10/03/2014  . Hyperlipidemia LDL goal <100 10/03/2014  . TIA (transient ischemic attack) 09/27/2014  . HTN (hypertension) 09/27/2014  . Overweight   . Hyperglycemia   . Severe obesity (BMI >= 40)     Kania Regnier, Radene JourneyLaura Ann MS, CCC-SLP 01/24/2015, 12:02 PM  [facility] [address] [place], [place], [address] Phone: [phone]   Fax:  [phone]

## 2015-01-30 ENCOUNTER — Encounter: Payer: Self-pay | Admitting: Internal Medicine

## 2015-01-30 ENCOUNTER — Other Ambulatory Visit: Payer: Self-pay | Admitting: Family Medicine

## 2015-01-31 ENCOUNTER — Ambulatory Visit: Payer: BC Managed Care – PPO | Attending: Neurology | Admitting: Speech Pathology

## 2015-01-31 DIAGNOSIS — R482 Apraxia: Secondary | ICD-10-CM

## 2015-01-31 NOTE — Patient Instructions (Signed)
Continue HEP for verbal motor planning, add repetition of rhyming sentences.

## 2015-01-31 NOTE — Therapy (Signed)
[facility] [address] [place], [place], [address] Phone: [phone]   Fax:  [phone]  Speech Language Pathology Treatment  Patient Details  Name: Shawna Waters MRN: [id number] Date of Birth: 12/15/1955 Referring Provider:  Maurice MarchMcPherson, Barbara B, MD  Encounter Date: 01/31/2015      End of Session - 01/31/15 1011    Visit Number 2   Number of Visits 8   Date for SLP Re-Evaluation 03/21/15   SLP Start Time 0932   SLP Stop Time  1012   SLP Time Calculation (min) 40 min   Activity Tolerance Patient tolerated treatment well      Past Medical History  Diagnosis Date  . Hypertension   . Stroke   . Encephalitis     Past Surgical History  Procedure Laterality Date  . Abdominal hysterectomy    . Loop recorder implant N/A 10/04/2014    Procedure: LOOP RECORDER IMPLANT;  Surgeon: Hillis RangeJames Allred, MD;  [facility];  Service: Cardiovascular;  Laterality: N/A;  . Tee without cardioversion N/A 10/04/2014    Procedure: TRANSESOPHAGEAL ECHOCARDIOGRAM (TEE);  Surgeon: Laurey Moralealton S McLean, MD;  [facility];  Service: Cardiovascular;  Laterality: N/A;    There were no vitals filed for this visit.  Visit Diagnosis: Verbal apraxia      Subjective Assessment - 01/31/15 0935    Subjective "People have said my speech is getting better"   Currently in Pain? No/denies               ADULT SLP TREATMENT - 01/31/15 0935    General Information   Behavior/Cognition Alert;Cooperative;Pleasant mood   Treatment Provided   Treatment provided Cognitive-Linquistic   Cognitive-Linquistic Treatment   Treatment focused on Apraxia   Skilled Treatment Facilitated fluent verbalization expression with oral reading of tongue twister rhyming sentences  with less than 10% minimal quick halting.  Pt perfromed structured speech tasks repeating multisyllabic words and generating verbal senteces with multisyllabic words with  less than 10% slight dysfluency and supervision cues.  Simple conversation with 10-20% slight halting and mod I with slow rate.    Assessment / Recommendations / Plan   Plan Continue with current plan of care   Progression Toward Goals   Progression toward goals Progressing toward goals          SLP Education - 01/31/15 1008    Education provided Yes   Education Details HEP, progress/improvement   Methods Explanation;Demonstration;Verbal cues   Comprehension Verbalized understanding;Returned demonstration;Need further instruction            SLP Long Term Goals - 01/31/15 1011    SLP LONG TERM GOAL #1   Title Pt will demonstrate compensations for verbal apraxia during 10 minute mildly complex conversation with mod I   Time 6   Period Weeks   Status On-going   SLP LONG TERM GOAL #2   Title Pt will demonstrate less than 25% dysfluencies in simple conversation over 10 minutes with rare min A   Time 6   Period Weeks   Status On-going   SLP LONG TERM GOAL #3   Title Pt will perform HEP for verbal apraxia with mod I   Time 6   Period Weeks   Status On-going          Plan - 01/31/15 1009    Clinical Impression Statement Shawna Waters has shown improvement since prior session/evaluation, with less than 10% slight dysfluency. Simple conversation with 10-20% slight  halting with supervisions cues for slow rate/compensations. Conintue skilled ST to maximize verbal expression fluency.         Problem List Patient Active Problem List   Diagnosis Date Noted  . HLD (hyperlipidemia)   . Acute ischemic stroke 10/03/2014  . Stroke 10/03/2014  . Hyperlipidemia LDL goal <100 10/03/2014  . TIA (transient ischemic attack) 09/27/2014  . HTN (hypertension) 09/27/2014  . Overweight   . Hyperglycemia   . Severe obesity (BMI >= 40)     Shawna Waters, Radene Journey MS, CCC-SLP 01/31/2015, 10:15 AM  [facility] [address] [place], [place], [address] Phone: [phone]   Fax:  [phone]

## 2015-02-01 ENCOUNTER — Ambulatory Visit
Admission: RE | Admit: 2015-02-01 | Discharge: 2015-02-01 | Disposition: A | Discharge status: Home / Self Care | Payer: BC Managed Care – PPO | Source: Ambulatory Visit | Attending: Neurology | Admitting: Neurology

## 2015-02-01 ENCOUNTER — Ambulatory Visit (INDEPENDENT_AMBULATORY_CARE_PROVIDER_SITE_OTHER): Payer: BC Managed Care – PPO | Admitting: *Deleted

## 2015-02-01 DIAGNOSIS — I639 Cerebral infarction, unspecified: Secondary | ICD-10-CM | POA: Diagnosis not present

## 2015-02-01 DIAGNOSIS — I69320 Aphasia following cerebral infarction: Secondary | ICD-10-CM | POA: Diagnosis not present

## 2015-02-01 DIAGNOSIS — IMO0002 Reserved for concepts with insufficient information to code with codable children: Secondary | ICD-10-CM

## 2015-02-04 ENCOUNTER — Ambulatory Visit (INDEPENDENT_AMBULATORY_CARE_PROVIDER_SITE_OTHER): Payer: BC Managed Care – PPO | Admitting: Family Medicine

## 2015-02-04 ENCOUNTER — Encounter: Payer: Self-pay | Admitting: Family Medicine

## 2015-02-04 VITALS — BP 138/90 | HR 67 | Temp 98.1°F | Resp 16 | Ht 62.0 in | Wt 205.8 lb

## 2015-02-04 DIAGNOSIS — F329 Major depressive disorder, single episode, unspecified: Secondary | ICD-10-CM

## 2015-02-04 DIAGNOSIS — F32A Depression, unspecified: Secondary | ICD-10-CM | POA: Insufficient documentation

## 2015-02-04 DIAGNOSIS — Z8673 Personal history of transient ischemic attack (TIA), and cerebral infarction without residual deficits: Secondary | ICD-10-CM

## 2015-02-04 DIAGNOSIS — IMO0002 Reserved for concepts with insufficient information to code with codable children: Secondary | ICD-10-CM

## 2015-02-04 DIAGNOSIS — R479 Unspecified speech disturbances: Secondary | ICD-10-CM

## 2015-02-04 DIAGNOSIS — I6350 Cerebral infarction due to unspecified occlusion or stenosis of unspecified cerebral artery: Secondary | ICD-10-CM | POA: Diagnosis not present

## 2015-02-04 DIAGNOSIS — I69328 Other speech and language deficits following cerebral infarction: Secondary | ICD-10-CM

## 2015-02-04 NOTE — Patient Instructions (Signed)
Great to see you today- please come and see us in a couple of months for fasting labs only I will be hoping for good news on your MRI- let us know if you need anything in this regard For now let's continue the sertraline but let's think of coming off it next spring if you are doing well

## 2015-02-04 NOTE — Progress Notes (Signed)
[facility] [address], [place] [address] [phone]  Date:  02/04/2015   Name:  Shawna Waters   DOB:  October 09, 1955   MRN:  [id number]  PCP:  Dow Adolph, MD    Chief Complaint: Follow-up; Cerebrovascular Accident; and Depression   History of Present Illness:  Shawna Waters is a 59 y.o. very pleasant female patient who presents with the following:  Here today for a follow-up check. She had a CVA in March of this year; cause unknown Seeing neurology- last visit about 2 weeks ago, she had complaint of new onset of speech difficulty and word- finding problems.   She also suffered right sided weakness and numbness which was improving They repeated her MRI on 7/8 but I do not see the report yet.  It looks like [facility] is going to read this.  Done due to speech problems She does not have any right arm tingling anymore, but does have it in her face.   She is walking 3 miles a day and has lost some pounds We are working on aggressive risk management with management of lipids, sugar, BP. She is driving again and came here on her own today. 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  She is doing well when she is at work, does not have the speech difficulty then She is married, lives in [place] with her husband.   She they have one son, 63. He lives at home with them currently.   Her A1c is fine as of last check.   She does check her BP at home- her highest SBP may be 135 She is taking a 1/2 BP pill as her BP was getting too low on a whole tab  She has felt a bit down and sometimes tearful over the last several months, she is on sertraline for this.  No SI.  She feels that she is doing ok  And that mood changes are due to recent CVA  Patient Active Problem List   Diagnosis Date Noted  . HLD (hyperlipidemia)   . Acute ischemic stroke 10/03/2014  . Stroke 10/03/2014  .  Hyperlipidemia LDL goal <100 10/03/2014  . TIA (transient ischemic attack) 09/27/2014  . HTN (hypertension) 09/27/2014  . Overweight   . Hyperglycemia   . Severe obesity (BMI >= 40)     Past Medical History  Diagnosis Date  . Hypertension   . Stroke   . Encephalitis     Past Surgical History  Procedure Laterality Date  . Abdominal hysterectomy    . Loop recorder implant N/A 10/04/2014    Procedure: LOOP RECORDER IMPLANT;  Surgeon: Hillis Range, MD;  Location: [facility] CATH LAB;  Service: Cardiovascular;  Laterality: N/A;  . Tee without cardioversion N/A 10/04/2014    Procedure: TRANSESOPHAGEAL ECHOCARDIOGRAM (TEE);  Surgeon: Laurey Morale, MD;  Location: [facility] ENDOSCOPY;  Service: Cardiovascular;  Laterality: N/A;    History  Substance Use Topics  . Smoking status: Never Smoker   . Smokeless tobacco: Not on file  . Alcohol Use: No    Family History  Problem Relation Age of Onset  . CAD Father   . Diabetes Maternal Grandmother   . Hypertension Other     Allergies  Allergen Reactions  . Codeine Other (See Comments)    Husband & patient unaware of reaction.     Medication list  has been reviewed and updated.  Current Outpatient Prescriptions on File Prior to Visit  Medication Sig Dispense Refill  . atorvastatin (LIPITOR) 20 MG tablet Take 1 tablet (20 mg total) by mouth daily at 6 PM. 30 tablet 6  . Cholecalciferol (VITAMIN D) 2000 UNITS tablet Take 2,000 Units by mouth daily.    . clopidogrel (PLAVIX) 75 MG tablet Take 1 tablet (75 mg total) by mouth daily. 30 tablet 6  . lisinopril-hydrochlorothiazide (PRINZIDE,ZESTORETIC) 20-12.5 MG per tablet Take 0.5 tablets by mouth daily. 30 tablet 6  . sertraline (ZOLOFT) 50 MG tablet TAKE 1 TABLET (50 MG TOTAL) BY MOUTH DAILY. 30 tablet 0   No current facility-administered medications on file prior to visit.    Review of Systems:  As per HPI- otherwise negative.   Physical Examination: Filed Vitals:   02/04/15 0815  BP:  162/84  Pulse: 67  Temp: 98.1 F (36.7 C)  Resp: 16   Filed Vitals:   02/04/15 0815  Height: 5\' 2"  (1.575 m)  Weight: 205 lb 12.8 oz (93.35 kg)   Body mass index is 37.63 kg/(m^2). Ideal Body Weight: Weight in (lb) to have BMI = 25: 136.4  GEN: WDWN, NAD, Non-toxic, A & O x 3, obese, looks well HEENT: Atraumatic, Normocephalic. Neck supple. No masses, No LAD. Ears and Nose: No external deformity. CV: RRR, No M/G/R. No JVD. No thrill. No extra heart sounds. PULM: CTA B, no wheezes, crackles, rhonchi. No retractions. No resp. distress. No accessory muscle use. EXTR: No c/c/e NEURO Normal gait.  Normal strength of BUE She does have a little bit of trouble with speaking fluency but nothing dramatic PSYCH: Normally interactive. Conversant. Not depressed or anxious appearing.  Calm demeanor.   Recheck BP as per VS  Wt Readings from Last 3 Encounters:  02/04/15 205 lb 12.8 oz (93.35 kg)  01/21/15 207 lb 3.2 oz (93.985 kg)  11/12/14 211 lb 6.4 oz (95.89 kg)     Assessment and Plan: History of CVA (cerebrovascular accident) - Plan: Comprehensive metabolic panel, CBC, Lipid panel, Hemoglobin A1c  Speech abnormality  Depression due to stroke  She continues to improve physically and we are managing her risk factors.  CHL checked recently- asked her to come back in 2 months for labs only Await report for her recent MRI- she will let me know if I can do anything to help Continue medications as above Continue sertraline for now- may be able to come off next spring  Signed Abbe AmsterdamJessica Copland, MD

## 2015-02-05 ENCOUNTER — Telehealth: Payer: Self-pay | Admitting: Neurology

## 2015-02-05 NOTE — Telephone Encounter (Signed)
Patient called to check the results of MRI. Please call and advise.

## 2015-02-05 NOTE — Telephone Encounter (Signed)
Left VM for pt to let her know it can take up to a week for MRI results to come back and we will call her once they are ready. Gave [facility] phone number with office hours if she had further questions. Told her Dr. Lucia GaskinsAhern and I are not in the office on Friday's.

## 2015-02-05 NOTE — Progress Notes (Signed)
Loop recorder 

## 2015-02-06 ENCOUNTER — Telehealth: Payer: Self-pay | Admitting: *Deleted

## 2015-02-06 NOTE — Telephone Encounter (Signed)
Spoke w/ pt about MRI brain results. Told her she does not have any new strokes. The MRI showed the old stroke on the left, but no new strokes. Speech changes are not d/t a new stroke. Pt verbalized understanding.   She stated her PCP is going to keep her on all her medications and wanted Dr. Lucia GaskinsAhern to know. I told her I would relay the message.

## 2015-02-06 NOTE — Telephone Encounter (Signed)
Error

## 2015-02-06 NOTE — Telephone Encounter (Signed)
Thanks

## 2015-02-07 ENCOUNTER — Ambulatory Visit: Payer: BC Managed Care – PPO | Admitting: Speech Pathology

## 2015-02-07 DIAGNOSIS — R482 Apraxia: Secondary | ICD-10-CM

## 2015-02-07 NOTE — Therapy (Signed)
[facility] [address] [place], [place], [address] Phone: [phone]   Fax:  [phone]  Speech Language Pathology Treatment  Patient Details  Name: Shawna Waters MRN: [id number] Date of Birth: 04-07-1956 Referring Provider:  Barton Fanny, MD  Encounter Date: 02/07/2015      End of Session - 02/07/15 0930    Visit Number 3   Number of Visits 8   Date for SLP Re-Evaluation 03/21/15   SLP Start Time 0847   SLP Stop Time  0920   SLP Time Calculation (min) 33 min   Activity Tolerance Patient tolerated treatment well      Past Medical History  Diagnosis Date  . Hypertension   . Stroke   . Encephalitis     Past Surgical History  Procedure Laterality Date  . Abdominal hysterectomy    . Loop recorder implant N/A 10/04/2014    Procedure: LOOP RECORDER IMPLANT;  Surgeon: Thompson Grayer, MD;  Location: [facility] CATH LAB;  Service: Cardiovascular;  Laterality: N/A;  . Tee without cardioversion N/A 10/04/2014    Procedure: TRANSESOPHAGEAL ECHOCARDIOGRAM (TEE);  Surgeon: Larey Dresser, MD;  Location: [facility];  Service: Cardiovascular;  Laterality: N/A;    There were no vitals filed for this visit.  Visit Diagnosis: Verbal apraxia      Subjective Assessment - 02/07/15 0849    Subjective "the MRI showed there was no stroke"   Currently in Pain? No/denies               ADULT SLP TREATMENT - 02/07/15 0850    General Information   Behavior/Cognition Alert;Cooperative;Pleasant mood   Treatment Provided   Treatment provided Cognitive-Linquistic   Pain Assessment   Pain Assessment No/denies pain   Cognitive-Linquistic Treatment   Treatment focused on Apraxia   Skilled Treatment Pt oral reading tongue twister senteces and paragraph with less than 10% dysfluency. D/w pt stress influence on her speech. Halting is inconsistent and atypical in conversation. As recent MRI negative and speech errors are   inconsistent, recommend pt be d/c'd from ST with home exercises and relaxation to manage stress..   Assessment / Recommendations / Plan   Plan Continue with current plan of care   General Recommendations   General recommendations Rehab consult   Progression Toward Goals   Progression toward goals Progressing toward goals          SLP Education - 02/07/15 0924    Education provided Yes   Education Details stress management   Person(s) Educated Patient   Methods Explanation;Demonstration;Verbal cues   Comprehension Verbalized understanding;Returned demonstration     SPEECH THERAPY DISCHARGE SUMMARY  Visits from Start of Care: 3  Current functional level related to goals / functional outcomes: See goals below   Remaining deficits: Mild speech disturbance, inconsistent and atypical. Pt verbalizes speech difference is worse with stress and she does not always have difficulty talking.   Education / Equipment: CVA education, stress management, continue f/u with physician for anxiety. Consider counseling if speech difference persists Plan: Patient agrees to discharge.  Patient goals were met. Patient is being discharged due to meeting the stated rehab goals.  ?????            SLP Long Term Goals - 02/07/15 0930    SLP LONG TERM GOAL #1   Title Pt will demonstrate compensations for verbal apraxia during 10 minute mildly complex conversation with mod I   Time 6   Period Weeks  Status Achieved   SLP LONG TERM GOAL #2   Title Pt will demonstrate less than 25% dysfluencies in simple conversation over 10 minutes with rare min A   Time 6   Period Weeks   Status Achieved   SLP LONG TERM GOAL #3   Title Pt will perform HEP for verbal apraxia with mod I   Time 6   Period Weeks   Status Achieved          Plan - 02/07/15 0926    Clinical Impression Statement Shawna Waters continues to improve with dysfluencies noted 0-10% of conversation with mod I Speech disturbance is  inconsistent and atypical - likely conversion. Due to improvement in speech and pt verbalizing awareness that speech distrubance is situational, I reocmmend  d/c ST at this time. Pt educated re: risk factors and s/s of CVA. If speech errors persist, consider mental health counseling.   Duration 1 week   Treatment/Interventions Functional tasks;Compensatory strategies;Patient/family education;Internal/external aids;SLP instruction and feedback   Potential to Achieve Goals Good   Consulted and Agree with Plan of Care Patient        Problem List Patient Active Problem List   Diagnosis Date Noted  . Depression due to stroke 02/04/2015  . 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 HLD (hyperlipidemia)   . Acute ischemic stroke 10/03/2014  . Stroke 10/03/2014  . Hyperlipidemia LDL goal <100 10/03/2014  . TIA (transient ischemic attack) 09/27/2014  . HTN (hypertension) 09/27/2014  . Overweight   . Hyperglycemia   . Severe obesity (BMI >= 40)     Autymn Omlor, Annye Rusk MS, CCC-SLP 02/07/2015, 9:31 AM  [facility] [address] [place], [place], [address] Phone: [phone]   Fax:  [phone]

## 2015-02-07 NOTE — Patient Instructions (Signed)
Continue Stress management, awareness of situation when she is having trouble speaking

## 2015-02-14 ENCOUNTER — Encounter: Payer: BC Managed Care – PPO | Admitting: Speech Pathology

## 2015-02-19 ENCOUNTER — Encounter: Payer: BC Managed Care – PPO | Admitting: Speech Pathology

## 2015-02-20 ENCOUNTER — Encounter: Payer: BC Managed Care – PPO | Admitting: Speech Pathology

## 2015-03-03 ENCOUNTER — Other Ambulatory Visit: Payer: Self-pay | Admitting: Physician Assistant

## 2015-03-04 ENCOUNTER — Ambulatory Visit (INDEPENDENT_AMBULATORY_CARE_PROVIDER_SITE_OTHER): Payer: BC Managed Care – PPO | Admitting: *Deleted

## 2015-03-04 DIAGNOSIS — I639 Cerebral infarction, unspecified: Secondary | ICD-10-CM

## 2015-03-05 LAB — CUP PACEART REMOTE DEVICE CHECK: MDC IDC SESS DTM: 20160809114648

## 2015-03-05 NOTE — Progress Notes (Signed)
Loop recorder 

## 2015-03-11 LAB — CUP PACEART REMOTE DEVICE CHECK: MDC IDC SESS DTM: 20160815074209

## 2015-03-14 ENCOUNTER — Encounter: Payer: Self-pay | Admitting: Internal Medicine

## 2015-03-20 ENCOUNTER — Encounter: Payer: Self-pay | Admitting: Internal Medicine

## 2015-04-02 ENCOUNTER — Ambulatory Visit (INDEPENDENT_AMBULATORY_CARE_PROVIDER_SITE_OTHER): Payer: BC Managed Care – PPO | Admitting: *Deleted

## 2015-04-02 DIAGNOSIS — I639 Cerebral infarction, unspecified: Secondary | ICD-10-CM

## 2015-04-03 NOTE — Progress Notes (Signed)
Loop recorder 

## 2015-04-13 LAB — CUP PACEART REMOTE DEVICE CHECK: MDC IDC SESS DTM: 20160917162923

## 2015-04-13 NOTE — Progress Notes (Signed)
Carelink summary report received. Battery status OK. Normal device function. No new symptom episodes, tachy episodes, brady, or pause episodes. No new AF episodes, +Plavix. Monthly summary reports and ROV with JA PRN. 

## 2015-04-24 ENCOUNTER — Other Ambulatory Visit (INDEPENDENT_AMBULATORY_CARE_PROVIDER_SITE_OTHER): Payer: BC Managed Care – PPO | Admitting: Family Medicine

## 2015-04-24 ENCOUNTER — Ambulatory Visit: Payer: Self-pay | Admitting: Neurology

## 2015-04-24 ENCOUNTER — Ambulatory Visit (INDEPENDENT_AMBULATORY_CARE_PROVIDER_SITE_OTHER): Payer: BC Managed Care – PPO | Admitting: Nurse Practitioner

## 2015-04-24 ENCOUNTER — Encounter: Payer: Self-pay | Admitting: Nurse Practitioner

## 2015-04-24 VITALS — BP 121/80 | HR 74 | Ht 61.5 in | Wt 203.0 lb

## 2015-04-24 DIAGNOSIS — E785 Hyperlipidemia, unspecified: Secondary | ICD-10-CM | POA: Diagnosis not present

## 2015-04-24 DIAGNOSIS — I6350 Cerebral infarction due to unspecified occlusion or stenosis of unspecified cerebral artery: Secondary | ICD-10-CM

## 2015-04-24 DIAGNOSIS — Z8673 Personal history of transient ischemic attack (TIA), and cerebral infarction without residual deficits: Secondary | ICD-10-CM | POA: Diagnosis not present

## 2015-04-24 DIAGNOSIS — I10 Essential (primary) hypertension: Secondary | ICD-10-CM | POA: Diagnosis not present

## 2015-04-24 DIAGNOSIS — I69328 Other speech and language deficits following cerebral infarction: Secondary | ICD-10-CM | POA: Diagnosis not present

## 2015-04-24 DIAGNOSIS — I639 Cerebral infarction, unspecified: Secondary | ICD-10-CM

## 2015-04-24 LAB — LIPID PANEL
CHOLESTEROL: 152 mg/dL (ref 125–200)
HDL: 35 mg/dL — AB (ref >=46)
LDL Cholesterol: 93 mg/dL (ref <130)
TRIGLYCERIDES: 120 mg/dL (ref <150)
Total CHOL/HDL Ratio: 4.3 Ratio (ref <=5.0)
VLDL: 24 mg/dL (ref <30)

## 2015-04-24 LAB — CBC
HEMATOCRIT: 37.7 % (ref 36.0–46.0)
Hemoglobin: 12.9 g/dL (ref 12.0–15.0)
MCH: 31.6 pg (ref 26.0–34.0)
MCHC: 34.2 g/dL (ref 30.0–36.0)
MCV: 92.4 fL (ref 78.0–100.0)
MPV: 9.9 fL (ref 8.6–12.4)
Platelets: 295 10*3/uL (ref 150–400)
RBC: 4.08 MIL/uL (ref 3.87–5.11)
RDW: 13.7 % (ref 11.5–15.5)
WBC: 6.2 10*3/uL (ref 4.0–10.5)

## 2015-04-24 LAB — COMPREHENSIVE METABOLIC PANEL
ALBUMIN: 4.1 g/dL (ref 3.6–5.1)
ALK PHOS: 91 U/L (ref 33–130)
ALT: 16 U/L (ref 6–29)
AST: 24 U/L (ref 10–35)
BUN: 11 mg/dL (ref 7–25)
CO2: 27 mmol/L (ref 20–31)
CREATININE: 0.77 mg/dL (ref 0.50–1.05)
Calcium: 9.8 mg/dL (ref 8.6–10.4)
Chloride: 103 mmol/L (ref 98–110)
Glucose, Bld: 86 mg/dL (ref 65–99)
Potassium: 4.6 mmol/L (ref 3.5–5.3)
SODIUM: 141 mmol/L (ref 135–146)
TOTAL PROTEIN: 7 g/dL (ref 6.1–8.1)
Total Bilirubin: 0.8 mg/dL (ref 0.2–1.2)

## 2015-04-24 NOTE — Progress Notes (Signed)
[facility]  PATIENT: Shawna Waters DOB: 01/01/1956   REASON FOR VISIT:stroke, speech apraxia HISTORY FROM:patient    HISTORY OF PRESENT ILLNESS:Shawna Waters, 59 year old female returns for follow-up. She had a hospital admission on 09/27/2014 for acute onset right-sided weakness and numbness. She was not on aspirin and was told to take aspirin, she was then admitted on March 8th  after another similar episode , CT of the head at that time showed a probable acute left medial temporal occipital ischemic infarction. She was placed on Plavix. She was last seen in this office by Dr. Lucia Gaskins 01/21/2015 for complaints of difficulty getting her words out. Repeat MRI of the brain did not show anything acute. She continues to have numbness in the right side of the face which is intermittent and weakness of the right upper extremity. She reports that both her fatigue and memory loss are better. She returns for reevaluation. She has not had further stroke or TIA symptoms. She is walking 3 miles every day. She has not returned to work however she does volunteering.   Interval history 01/21/2015: She is here with husband. She is having issues getting words out, stuttering, word-finding difficulty. This is new, she didn't have this previously. Endorses compliance with medications. Her speech difficulties started a month ago and they thought it was late effect due to the stroke but thought it was odd for new symptoms after stroke. Acute onset of word-finding difficulty and stuttering. No other new focal neurologic symptoms  Initial visit 10/17/2014: Shawna Waters is a 59 y.o. female here as a referral from Dr. Audria Nine for hospital follow up after admission for stroke workup. PMHx HTN, HLD, Obesity. She was admitted on 3/3 for acute onset right-sided weakness and numbness, diagnosed with a TIA. She was seen on 3/7 again in the ED for similar symptoms and sent home. She was admitted on 3/8 after  another episode and CT showed probable acute left medial temporal occipital ischemic infarction.Marland Kitchen She was not on aspirin or stroke prevention before March 3rd, March 3rd started taking the aspirin. She was changed to Plavix. She is now on Lipitor. Symptoms are getting better. The arm is still a little weak, but she is improved. She feels light headed. Walks three times a day. She feels lightheaded when she gets up. Dizziness better since decreasing the blood pressure medication. She is struggling with anxiety and depression. Symptoms started acutely. Currently she has minimal symptoms. Not interfering with her ADLs or IADLs, and no other associated symptoms except for the dizziness that probably is due to low BP. She is working with pcp on this.     REVIEW OF SYSTEMS: Full 14 system review of systems performed and notable only for those listed, all others are neg:  Constitutional: neg  Cardiovascular: neg Ear/Nose/Throat: neg  Skin: neg Eyes: neg Respiratory: neg Gastroitestinal: neg  Hematology/Lymphatic: neg  Endocrine: neg Musculoskeletal:neg Allergy/Immunology: neg Neurological: Numbness weakness Psychiatric: Anxiety Sleep : neg   ALLERGIES: Allergies  Allergen Reactions  . Codeine Other (See Comments)    Husband & patient unaware of reaction.     HOME MEDICATIONS: Outpatient Prescriptions Prior to Visit  Medication Sig Dispense Refill  . atorvastatin (LIPITOR) 20 MG tablet Take 1 tablet (20 mg total) by mouth daily at 6 PM. 30 tablet 6  . Cholecalciferol (VITAMIN D) 2000 UNITS tablet Take 2,000 Units by mouth daily.    . clopidogrel (PLAVIX) 75 MG tablet Take 1 tablet (75 mg total) by  mouth daily. 30 tablet 6  . lisinopril-hydrochlorothiazide (PRINZIDE,ZESTORETIC) 20-12.5 MG per tablet Take 0.5 tablets by mouth daily. 30 tablet 6  . sertraline (ZOLOFT) 50 MG tablet TAKE 1 TABLET (50 MG TOTAL) BY MOUTH DAILY. 30 tablet 5   No facility-administered medications prior to  visit.    PAST MEDICAL HISTORY: Past Medical History  Diagnosis Date  . Hypertension   . Stroke   . Encephalitis     PAST SURGICAL HISTORY: Past Surgical History  Procedure Laterality Date  . Abdominal hysterectomy    . Loop recorder implant N/A 10/04/2014    Procedure: LOOP RECORDER IMPLANT;  Surgeon: Hillis Range, MD;  Location: [facility] CATH LAB;  Service: Cardiovascular;  Laterality: N/A;  . Tee without cardioversion N/A 10/04/2014    Procedure: TRANSESOPHAGEAL ECHOCARDIOGRAM (TEE);  Surgeon: Laurey Morale, MD;  Location: [facility] ENDOSCOPY;  Service: Cardiovascular;  Laterality: N/A;    FAMILY HISTORY: Family History  Problem Relation Age of Onset  . CAD Father   . Diabetes Maternal Grandmother   . Hypertension Other     SOCIAL HISTORY: Social History   Social History  . Marital Status: Married    Spouse Name: N/A  . Number of Children: 1  . Years of Education: College   Occupational History  . Not on file.   Social History Main Topics  . Smoking status: Never Smoker   . Smokeless tobacco: Not on file  . Alcohol Use: No  . Drug Use: No  . Sexual Activity: Not on file   Other Topics Concern  . Not on file   Social History Narrative   Lives at home with husband.   Right handed.   Caffeine use: none     PHYSICAL EXAM  Filed Vitals:   04/24/15 0843  BP: 121/80  Pulse: 74  Height: 5' 1.5" (1.562 m)  Weight: 203 lb (92.08 kg)   Body mass index is 37.74 kg/(m^2).  Generalized: Well developed, in no acute distress  Head: normocephalic and atraumatic,. Oropharynx benign  Neck: Supple, no carotid bruits  Cardiac: Regular rate rhythm, no murmur  Musculoskeletal: No deformity   Neurological examination   Mentation: Alert oriented to time, place, history taking. Attention span and concentration appropriate. Recent and remote memory intact.  Follows all commands speech and language fluent.   Cranial nerve II-XII: Pupils were equal round reactive to light  extraocular movements were full, visual field were full on confrontational test. Facial sensation and strength were normal. hearing was intact to finger rubbing bilaterally. Uvula tongue midline. head turning and shoulder shrug were normal and symmetric.Tongue protrusion into cheek strength was normal. Motor: normal bulk and tone, full strength in the BUE, BLE, fine finger movements normal, no pronator drift. No focal weakness Sensory: normal and symmetric to light touch, pinprick, and  Vibration,  Coordination: finger-nose-finger, heel-to-shin bilaterally, no dysmetria Reflexes: Brachioradialis 2/2, biceps 2/2, triceps 2/2, patellar 2/2, Achilles 2/2, plantar responses were flexor bilaterally. Gait and Station: Rising up from seated position without assistance, normal stance,  moderate stride, good arm swing, smooth turning, able to perform tiptoe, and heel walking without difficulty. Tandem gait is steady  DIAGNOSTIC DATA (LABS, IMAGING, TESTING) - I reviewed patient records, labs, notes, testing and imaging myself where available.  Lab Results  Component Value Date   WBC 7.3 10/10/2014   HGB 15.2* 10/10/2014   HCT 44.9 10/10/2014   MCV 91.8 10/10/2014   PLT 412* 10/10/2014      Component Value Date/Time  NA 139 10/31/2014 0936   K 4.4 10/31/2014 0936   CL 104 10/31/2014 0936   CO2 26 10/31/2014 0936   GLUCOSE 102* 10/31/2014 0936   BUN 11 10/31/2014 0936   CREATININE 0.90 10/31/2014 0936   CREATININE 0.93 10/03/2014 0655   CALCIUM 9.7 10/31/2014 0936   PROT 7.7 10/03/2014 0655   ALBUMIN 3.9 10/03/2014 0655   AST 23 10/03/2014 0655   ALT 18 10/03/2014 0655   ALKPHOS 77 10/03/2014 0655   BILITOT 1.0 10/03/2014 0655   GFRNONAA 54* 10/10/2014 1234   GFRNONAA 67* 10/03/2014 0655   GFRAA 62 10/10/2014 1234   GFRAA 78* 10/03/2014 0655   Lab Results  Component Value Date   CHOL 198 10/03/2014   HDL 38* 10/03/2014   LDLCALC 139* 10/03/2014   LDLDIRECT 69 10/31/2014   TRIG 106  10/03/2014   CHOLHDL 5.2 10/03/2014   Lab Results  Component Value Date   HGBA1C 5.5 10/03/2014   No results found for: [id number] Lab Results  Component Value Date   TSH 2.055 10/10/2014      ASSESSMENT AND PLAN  60 y.o. 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  Keep systolic blood pressure less than 130, today's reading 121/80 Lipids are followed by PCP,  Keep LDL less than 100 last was 69 Carotid Doppler to be repeated in 1 year March 2017 Continue Plavix for  secondary stroke prevention If recurrent stroke symptoms occur, call 911 and proceed to the hospital F/U in 6 months, next with Dr. Haynes Bast, [facility], [facility], APRN  [facility] [address], [address] [place], [place] [address] [phone]

## 2015-04-24 NOTE — Patient Instructions (Addendum)
Keep systolic blood pressure less than 130, today's reading 121/80 Lipids are followed by PCP,  Keep LDL less than 100 last was 69 Carotid Doppler to be repeated in 1 year March 2017 Continue Plavix for  secondary stroke prevention If recurrent stroke symptoms occur, call 911 and proceed to the hospital F/U in 6 months

## 2015-04-25 LAB — HEMOGLOBIN A1C
Hgb A1c MFr Bld: 5.7 % — ABNORMAL HIGH (ref <5.7)
Mean Plasma Glucose: 117 mg/dL — ABNORMAL HIGH (ref <117)

## 2015-05-02 ENCOUNTER — Ambulatory Visit (INDEPENDENT_AMBULATORY_CARE_PROVIDER_SITE_OTHER): Payer: BC Managed Care – PPO | Admitting: *Deleted

## 2015-05-02 DIAGNOSIS — I638 Other cerebral infarction: Secondary | ICD-10-CM | POA: Diagnosis not present

## 2015-05-02 DIAGNOSIS — I6389 Other cerebral infarction: Secondary | ICD-10-CM

## 2015-05-03 NOTE — Progress Notes (Signed)
Loop recorder 

## 2015-05-05 NOTE — Progress Notes (Signed)
Personally  participated in and made any corrections needed to history, physical, neuro exam,assessment and plan as stated above.     Jaben Benegas, MD [facility] 

## 2015-05-14 LAB — CUP PACEART REMOTE DEVICE CHECK: Date Time Interrogation Session: 20161006213652

## 2015-05-14 NOTE — Progress Notes (Signed)
Carelink summary report received. Battery status OK. Normal device function. No new symptom episodes, tachy episodes, brady, or pause episodes. No new AF episodes. Monthly summary reports and ROV with JA PRN. 

## 2015-05-21 ENCOUNTER — Encounter: Payer: Self-pay | Admitting: Internal Medicine

## 2015-05-28 ENCOUNTER — Other Ambulatory Visit: Payer: Self-pay | Admitting: Neurology

## 2015-06-03 ENCOUNTER — Ambulatory Visit (INDEPENDENT_AMBULATORY_CARE_PROVIDER_SITE_OTHER): Payer: BC Managed Care – PPO | Admitting: *Deleted

## 2015-06-03 DIAGNOSIS — I638 Other cerebral infarction: Secondary | ICD-10-CM

## 2015-06-03 DIAGNOSIS — I6389 Other cerebral infarction: Secondary | ICD-10-CM

## 2015-06-03 NOTE — Progress Notes (Signed)
Carelink Summary Report / Loop recorder 

## 2015-06-07 ENCOUNTER — Encounter: Payer: Self-pay | Admitting: Internal Medicine

## 2015-06-30 LAB — CUP PACEART REMOTE DEVICE CHECK: MDC IDC SESS DTM: 20161105220544

## 2015-06-30 NOTE — Progress Notes (Signed)
Carelink summary report received. Battery status OK. Normal device function. No new symptom episodes, tachy episodes, brady, or pause episodes. No new AF episodes. Monthly summary reports and ROV with JA PRN. 

## 2015-07-01 ENCOUNTER — Ambulatory Visit (INDEPENDENT_AMBULATORY_CARE_PROVIDER_SITE_OTHER): Payer: BC Managed Care – PPO | Admitting: *Deleted

## 2015-07-01 DIAGNOSIS — I638 Other cerebral infarction: Secondary | ICD-10-CM

## 2015-07-01 DIAGNOSIS — I6389 Other cerebral infarction: Secondary | ICD-10-CM

## 2015-07-02 NOTE — Progress Notes (Signed)
Carelink Summary Report / Loop Recorder 

## 2015-07-26 ENCOUNTER — Telehealth: Payer: Self-pay

## 2015-07-26 NOTE — Telephone Encounter (Signed)
Pt called wanting to know what she could take for her viral illness. Advised to avoid sudafed and NSAID's. Ok to take Tylenol and Mucinex.

## 2015-07-31 ENCOUNTER — Ambulatory Visit (INDEPENDENT_AMBULATORY_CARE_PROVIDER_SITE_OTHER): Payer: BC Managed Care – PPO | Admitting: *Deleted

## 2015-07-31 DIAGNOSIS — I638 Other cerebral infarction: Secondary | ICD-10-CM

## 2015-07-31 DIAGNOSIS — I6389 Other cerebral infarction: Secondary | ICD-10-CM

## 2015-08-01 NOTE — Progress Notes (Signed)
Carelink Summary Report / Loop Recorder 

## 2015-08-16 ENCOUNTER — Encounter: Payer: Self-pay | Admitting: Family Medicine

## 2015-08-21 ENCOUNTER — Encounter: Payer: Self-pay | Admitting: Family Medicine

## 2015-08-27 LAB — CUP PACEART REMOTE DEVICE CHECK: Date Time Interrogation Session: 20161205223714

## 2015-08-30 ENCOUNTER — Other Ambulatory Visit: Payer: Self-pay | Admitting: Family Medicine

## 2015-08-30 ENCOUNTER — Ambulatory Visit (INDEPENDENT_AMBULATORY_CARE_PROVIDER_SITE_OTHER): Payer: BC Managed Care – PPO | Admitting: *Deleted

## 2015-08-30 DIAGNOSIS — I6389 Other cerebral infarction: Secondary | ICD-10-CM

## 2015-08-30 DIAGNOSIS — I638 Other cerebral infarction: Secondary | ICD-10-CM | POA: Diagnosis not present

## 2015-09-02 NOTE — Progress Notes (Signed)
Carelink Summary Report / Loop Recorder 

## 2015-09-12 LAB — CUP PACEART REMOTE DEVICE CHECK: MDC IDC SESS DTM: 20170104223610

## 2015-09-12 NOTE — Progress Notes (Signed)
Carelink summary report received. Battery status OK. Normal device function. No new symptom episodes, tachy episodes, brady, or pause episodes. No new AF episodes. Monthly summary reports and ROV/PRN 

## 2015-09-20 ENCOUNTER — Ambulatory Visit (INDEPENDENT_AMBULATORY_CARE_PROVIDER_SITE_OTHER): Payer: BC Managed Care – PPO | Admitting: Physician Assistant

## 2015-09-20 VITALS — BP 130/80 | HR 71 | Temp 97.7°F | Resp 16 | Ht 61.0 in | Wt 199.0 lb

## 2015-09-20 DIAGNOSIS — Z1159 Encounter for screening for other viral diseases: Secondary | ICD-10-CM | POA: Diagnosis not present

## 2015-09-20 DIAGNOSIS — I10 Essential (primary) hypertension: Secondary | ICD-10-CM | POA: Diagnosis not present

## 2015-09-20 DIAGNOSIS — Z114 Encounter for screening for human immunodeficiency virus [HIV]: Secondary | ICD-10-CM

## 2015-09-20 DIAGNOSIS — R739 Hyperglycemia, unspecified: Secondary | ICD-10-CM

## 2015-09-20 DIAGNOSIS — IMO0002 Reserved for concepts with insufficient information to code with codable children: Secondary | ICD-10-CM

## 2015-09-20 DIAGNOSIS — E785 Hyperlipidemia, unspecified: Secondary | ICD-10-CM | POA: Diagnosis not present

## 2015-09-20 DIAGNOSIS — F0631 Mood disorder due to known physiological condition with depressive features: Secondary | ICD-10-CM

## 2015-09-20 DIAGNOSIS — Z1211 Encounter for screening for malignant neoplasm of colon: Secondary | ICD-10-CM

## 2015-09-20 DIAGNOSIS — I639 Cerebral infarction, unspecified: Secondary | ICD-10-CM

## 2015-09-20 DIAGNOSIS — Z1231 Encounter for screening mammogram for malignant neoplasm of breast: Secondary | ICD-10-CM | POA: Diagnosis not present

## 2015-09-20 LAB — COMPREHENSIVE METABOLIC PANEL
ALBUMIN: 3.8 g/dL (ref 3.6–5.1)
ALK PHOS: 88 U/L (ref 33–130)
ALT: 18 U/L (ref 6–29)
AST: 23 U/L (ref 10–35)
BILIRUBIN TOTAL: 0.5 mg/dL (ref 0.2–1.2)
BUN: 11 mg/dL (ref 7–25)
CO2: 26 mmol/L (ref 20–31)
CREATININE: 0.79 mg/dL (ref 0.50–1.05)
Calcium: 9 mg/dL (ref 8.6–10.4)
Chloride: 105 mmol/L (ref 98–110)
Glucose, Bld: 91 mg/dL (ref 65–99)
Potassium: 4.2 mmol/L (ref 3.5–5.3)
SODIUM: 141 mmol/L (ref 135–146)
TOTAL PROTEIN: 6.9 g/dL (ref 6.1–8.1)

## 2015-09-20 LAB — LIPID PANEL
Cholesterol: 134 mg/dL (ref 125–200)
HDL: 38 mg/dL — ABNORMAL LOW (ref >=46)
LDL CALC: 68 mg/dL (ref <130)
TRIGLYCERIDES: 140 mg/dL (ref <150)
Total CHOL/HDL Ratio: 3.5 Ratio (ref <=5.0)
VLDL: 28 mg/dL (ref <30)

## 2015-09-20 LAB — HIV ANTIBODY (ROUTINE TESTING W REFLEX): HIV 1&2 Ab, 4th Generation: NONREACTIVE

## 2015-09-20 LAB — HEMOGLOBIN A1C
HEMOGLOBIN A1C: 5.5 % (ref <5.7)
MEAN PLASMA GLUCOSE: 111 mg/dL (ref <117)

## 2015-09-20 LAB — HEPATITIS C ANTIBODY: HCV Ab: NEGATIVE

## 2015-09-20 MED ORDER — LISINOPRIL-HYDROCHLOROTHIAZIDE 20-12.5 MG PO TABS: 0.5000 | ORAL_TABLET | Freq: Every day | ORAL | Status: DC

## 2015-09-20 MED ORDER — SERTRALINE HCL 50 MG PO TABS
50.0000 mg | ORAL_TABLET | Freq: Every day | ORAL | Status: DC
Start: 2015-09-20 — End: 2016-07-23

## 2015-09-20 NOTE — Progress Notes (Signed)
Patient ID: Shawna Waters, female    DOB: 1955-11-01, 60 y.o.   MRN: [id number]  PCP: Olene Floss  Subjective:   Chief Complaint  Patient presents with  . Medication Refill    Sertraline HCL  , Lisinopril 20-12.5mg      HPI Presents for evaluation of HTN and depression, and to establish for primary care, as her previous PCP has left this practice.  Overall, she's doing well. Tolerating medications without adverse effects, though she'd really like to reduce them. She's also fearful to stop anything that may be preventing a second stroke, as it's unclear why she had the first.  Walking 3 miles daily. No tobacco or alcohol. Reduced salt intake.  She has a little bit of sinus congestion and drainage, so declines seasonal influenza and Tdap vaccines today.  Fasting in preparation for lab draw.    Review of Systems  Constitutional: Negative for activity change, appetite change, fatigue and unexpected weight change.  HENT: Positive for congestion and postnasal drip. Negative for dental problem, ear pain, hearing loss, mouth sores, rhinorrhea, sneezing, sore throat, tinnitus and trouble swallowing.   Eyes: Negative for photophobia, pain, redness and visual disturbance.  Respiratory: Negative for cough, chest tightness and shortness of breath.   Cardiovascular: Negative for chest pain, palpitations and leg swelling.  Gastrointestinal: Negative for nausea, vomiting, abdominal pain, diarrhea, constipation and blood in stool.  Genitourinary: Negative for dysuria, urgency, frequency and hematuria.  Musculoskeletal: Negative for myalgias, arthralgias, gait problem and neck stiffness.  Skin: Negative for rash.  Neurological: Negative for dizziness, speech difficulty, weakness, light-headedness, numbness and headaches.  Hematological: Negative for adenopathy.  Psychiatric/Behavioral: Negative for confusion and sleep disturbance. The patient is not nervous/anxious.         Patient Active Problem List   Diagnosis Date Noted  . 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 Acute ischemic stroke (HCC) 10/03/2014  . Hyperlipidemia LDL goal <100 10/03/2014  . TIA (transient ischemic attack) 09/27/2014  . HTN (hypertension) 09/27/2014  . Hyperglycemia   . BMI 37.0-37.9, adult      Prior to Admission medications   Medication Sig Start Date End Date Taking? Authorizing Provider  atorvastatin (LIPITOR) 20 MG tablet TAKE 1 TABLET (20 MG TOTAL) BY MOUTH DAILY AT 6 PM. 05/28/15  Yes Anson Fret, MD  Cholecalciferol (VITAMIN D) 2000 UNITS tablet Take 2,000 Units by mouth daily.   Yes Historical Provider, MD  clopidogrel (PLAVIX) 75 MG tablet TAKE 1 TABLET BY MOUTH EVERY DAY 05/28/15  Yes Anson Fret, MD  lisinopril-hydrochlorothiazide (PRINZIDE,ZESTORETIC) 20-12.5 MG tablet Take 0.5 tablets by mouth daily. 09/20/15  Yes Niel Peretti, PA-C  sertraline (ZOLOFT) 50 MG tablet Take 1 tablet (50 mg total) by mouth daily. 09/20/15  Yes Porfirio Oar, PA-C     Allergies  Allergen Reactions  . Codeine Other (See Comments)    Husband & patient unaware of reaction.        Objective:  Physical Exam  Constitutional: She is oriented to person, place, and time. She appears well-developed and well-nourished. She is active and cooperative. No distress.  BP 130/80 mmHg  Pulse 71  Temp(Src) 97.7 F (36.5 C) (Oral)  Resp 16  Ht  (1.549 m)  Wt 199 lb (90.266 kg)  BMI 37.62 kg/m2  SpO2 99%  HENT:  Head: Normocephalic and atraumatic.  Right Ear: Hearing normal.  Left Ear: Hearing normal.  Eyes:  Conjunctivae are normal. No scleral icterus.  Neck: Normal range of motion. Neck supple. No thyromegaly present.  Cardiovascular: Normal rate, regular rhythm and normal heart sounds.   Pulses:      Radial pulses are 2+ on the right side, and 2+ on the left side.  Pulmonary/Chest: Effort  normal and breath sounds normal.  Lymphadenopathy:       Head (right side): No tonsillar, no preauricular, no posterior auricular and no occipital adenopathy present.       Head (left side): No tonsillar, no preauricular, no posterior auricular and no occipital adenopathy present.    She has no cervical adenopathy.       Right: No supraclavicular adenopathy present.       Left: No supraclavicular adenopathy present.  Neurological: She is alert and oriented to person, place, and time. No sensory deficit.  Skin: Skin is warm, dry and intact. No rash noted. No cyanosis or erythema. Nails show no clubbing.  Psychiatric: She has a normal mood and affect. Her speech is normal and behavior is normal.  A little tearful discussing the approaching 1 year anniversary of the stroke.           Assessment & Plan:   1. Essential hypertension Stable. Controlled. Continue current treatment. - lisinopril-hydrochlorothiazide (PRINZIDE,ZESTORETIC) 20-12.5 MG tablet; Take 0.5 tablets by mouth daily.  Dispense: 45 tablet; Refill: 3 - Comprehensive metabolic panel  2. 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 Controlled. Continue current treatment. Not ready to taper off this yet. - sertraline (ZOLOFT) 50 MG tablet; Take 1 tablet (50 mg total) by mouth daily.  Dispense: 90 tablet; Refill: 3  3. Hyperlipidemia LDL goal <100 Await lab results. Continue atorvastatin. - Lipid panel  4. Hyperglycemia Await lab results. Consider metformin. Continue healthy lifestyle modifications. - Hemoglobin A1c  5. Screening for HIV (human immunodeficiency virus) - HIV antibody  6. Need for hepatitis C screening test - Hepatitis C antibody  7. Screening for colon cancer - Ambulatory referral to Gastroenterology  8. Encounter for screening mammogram for breast cancer - MM Digital Screening; Future   Return in about 6 months (around 03/19/2016).    Fernande Bras, PA-C Physician Assistant-Certified [facility]

## 2015-09-20 NOTE — Patient Instructions (Signed)
Because you received labwork today, you will receive an invoice from [facility]. Please contact [facility] at [phone] with questions or concerns regarding your invoice. Our billing staff will not be able to assist you with those questions.  You will be contacted with the lab results as soon as they are available. The fastest way to get your results is to activate your My Chart account. Instructions are located on the last [place] of this paperwork. If you have not heard from us regarding the results in 2 weeks, please contact this office.  

## 2015-09-28 ENCOUNTER — Other Ambulatory Visit: Payer: Self-pay | Admitting: Physician Assistant

## 2015-09-30 ENCOUNTER — Ambulatory Visit (INDEPENDENT_AMBULATORY_CARE_PROVIDER_SITE_OTHER): Payer: BC Managed Care – PPO | Admitting: *Deleted

## 2015-09-30 DIAGNOSIS — I638 Other cerebral infarction: Secondary | ICD-10-CM | POA: Diagnosis not present

## 2015-09-30 DIAGNOSIS — I6389 Other cerebral infarction: Secondary | ICD-10-CM

## 2015-10-01 LAB — CUP PACEART REMOTE DEVICE CHECK: Date Time Interrogation Session: 20170203230623

## 2015-10-01 NOTE — Progress Notes (Signed)
Carelink summary report received. Battery status OK. Normal device function. No new symptom episodes, tachy episodes, brady, or pause episodes. No new AF episodes. Monthly summary reports and ROV/PRN 

## 2015-10-04 NOTE — Progress Notes (Signed)
Carelink Summary Report / Loop Recorder 

## 2015-10-05 LAB — CUP PACEART REMOTE DEVICE CHECK: MDC IDC SESS DTM: 20170305233719

## 2015-10-05 NOTE — Progress Notes (Signed)
Carelink summary report received. Battery status OK. Normal device function. No new symptom episodes, tachy episodes, brady, or pause episodes. No new AF episodes. Monthly summary reports and ROV/PRN 

## 2015-10-22 ENCOUNTER — Ambulatory Visit: Payer: BC Managed Care – PPO | Admitting: Neurology

## 2015-10-23 ENCOUNTER — Encounter: Payer: Self-pay | Admitting: Neurology

## 2015-10-23 ENCOUNTER — Ambulatory Visit (INDEPENDENT_AMBULATORY_CARE_PROVIDER_SITE_OTHER): Payer: BC Managed Care – PPO | Admitting: Neurology

## 2015-10-23 VITALS — BP 148/82 | HR 65 | Ht 61.0 in | Wt 199.0 lb

## 2015-10-23 DIAGNOSIS — Z736 Limitation of activities due to disability: Secondary | ICD-10-CM

## 2015-10-23 DIAGNOSIS — R29898 Other symptoms and signs involving the musculoskeletal system: Secondary | ICD-10-CM

## 2015-10-23 DIAGNOSIS — Z8673 Personal history of transient ischemic attack (TIA), and cerebral infarction without residual deficits: Secondary | ICD-10-CM | POA: Diagnosis not present

## 2015-10-23 DIAGNOSIS — M6289 Other specified disorders of muscle: Secondary | ICD-10-CM

## 2015-10-23 DIAGNOSIS — R292 Abnormal reflex: Secondary | ICD-10-CM

## 2015-10-23 DIAGNOSIS — M79601 Pain in right arm: Secondary | ICD-10-CM

## 2015-10-23 DIAGNOSIS — I6930 Unspecified sequelae of cerebral infarction: Secondary | ICD-10-CM

## 2015-10-23 MED ORDER — ATORVASTATIN CALCIUM 20 MG PO TABS: ORAL_TABLET | ORAL | Status: DC

## 2015-10-23 MED ORDER — CLOPIDOGREL BISULFATE 75 MG PO TABS: 75.0000 mg | ORAL_TABLET | Freq: Every day | ORAL | Status: DC

## 2015-10-23 NOTE — Progress Notes (Signed)
[facility]    Provider:  Dr Lucia GaskinsAhern Referring Provider: Patsy Lageropland, Gwenlyn FoundJessica C, MD Primary Care Physician:  Olene FlossJEFFERY,CHELLE, PA-C  CC: Hospital follow up   Interval f/u 10/23/2015:  She continues to have numbness in the right side of the face which is intermittent tingling and weakness of the right upper extremity. She reports that both her fatigue and memory loss are better. She returns for reevaluation. She has not had further stroke or TIA symptoms. She is walking 3 miles every day. She has not returned to work however she does volunteering. She feels better, she feels her speech is better. She is still on the plavix without problems. She snores but not excessively and no witnessed apneic events. No excessively tired during the day. She has more energy. She is a restless sleeper. She goes to bed at 10am and wakes up 7:30am. No morning headaches.Repeat MRi of the brain did not show any new etiology. She has crazy dreams.   Interval history 01/21/2015: She is here with husband. She is having issues getting words out, stuttering, word-finding difficulty. This is new, she didn't have this previously. Endorses compliance with medications. Her speech difficulties started a month ago and they thought it was late effect due to the stroke but thought it was odd for new symptoms after stroke. Acute onset of word-finding difficulty and stuttering. No other new focal neurologic symptoms  Initial visit 10/17/2014: Norma FredricksonVickie B Madero is a 60 y.o. female here as a referral from Dr. Audria NineMcPherson for hospital follow up after admission for stroke workup. PMHx HTN, HLD, Obesity. She was admitted on 3/3 for acute onset right-sided weakness and numbness, diagnosed with a TIA. She was seen on 3/7 again in the ED for similar symptoms and sent home. She was admitted on 3/8 after another episode and CT showed probable acute left medial temporal occipital ischemic infarction.Marland Kitchen. She was not on aspirin or stroke  prevention before March 3rd, March 3rd started taking the aspirin. She was changed to Plavix. She is now on Lipitor. Symptoms are getting better. The arm is still a little weak, but she is improved. She feels light headed. Walks three times a day. She feels lightheaded when she gets up. Dizziness better since decreasing the blood pressure medication. She is struggling with anxiety and depression. Symptoms started acutely. Currently she has minimal symptoms. Not interfering with her ADLs or IADLs, and no other associated symptoms except for the dizziness that probably is due to low BP. She is working with pcp on this.    Reviewed notes, labs and imaging from outside physicians, which showed: Patient was admitted on 09/27/2014 for , intermittent episodes of right arm weakness/heaviness sensation, numbness, lightheadedness and blurred vision. 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 Diagnosed with a TIA and discharged on ASA, BP meds (due to HTN), . She was seen again in the emergency room with similar symptoms on 10/01/2014. No focal deficits were noted and she was sent home. She had several episodes of right-sided numbness involving ascending arm and subsequently involving right leg. She presented again on 3/8. At that time, CT showed probable acute left medial temporal occipital ischemic infarction. Admitted for workup. TEE and echo did not show thrombus. HgbA1c 5.6. LDL 139. She was switched to plavix. Loop recorder implanted as left PCA flow signal thought to be due to embolus.  MR Brain (personally reviewed images, agree with findings) IMPRESSION: 1. Acute nonhemorrhagic infarct within  the inferomedial left temporal lobe is confirmed. 2. At least 3 additional punctate nonhemorrhagic infarcts are evident within the posterior left temporal lobe and occipital pole. 3. Abnormal flow signal in the proximal left PCA compatible  with the area of high-grade stenosis on the CTA. Review of Systems: Patient complains of symptoms per HPI as well as the following symptoms: Fatigue, speech difficulty, memory loss, numbness, fatigue, excessive thirst. Pertinent negatives per HPI. All others negative.    Social History   Social History  . Marital Status: Married    Spouse Name: RIchard N. Melnik  . Number of Children: 1  . Years of Education: College   Occupational History  . 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 Smoking status: Never Smoker   . Smokeless tobacco: Never Used  . Alcohol Use: No  . Drug Use: No  . Sexual Activity: Not on file   Other Topics Concern  . Not on file   Social History Narrative   Lives at home with husband. Adult son lives with them.   Right handed.   Caffeine use: none    Family History  Problem Relation Age of Onset  . CAD Father   . Diabetes Maternal Grandmother   . Hypertension Other     Past Medical History  Diagnosis Date  . Hypertension   . Stroke (HCC)   . Encephalitis     Past Surgical History  Procedure Laterality Date  . Abdominal hysterectomy    . Loop recorder implant N/A 10/04/2014    Procedure: LOOP RECORDER IMPLANT;  Surgeon: Hillis Range, MD;  Location: [facility] CATH LAB;  Service: Cardiovascular;  Laterality: N/A;  . Tee without cardioversion N/A 10/04/2014    Procedure: TRANSESOPHAGEAL ECHOCARDIOGRAM (TEE);  Surgeon: Laurey Morale, MD;  Location: [facility] ENDOSCOPY;  Service: Cardiovascular;  Laterality: N/A;    Current Outpatient Prescriptions  Medication Sig Dispense Refill  . atorvastatin (LIPITOR) 20 MG tablet TAKE 1 TABLET (20 MG TOTAL) BY MOUTH DAILY AT 6 PM. 30 tablet 6  . Cholecalciferol (VITAMIN D) 2000 UNITS tablet Take 2,000 Units by mouth daily.    . clopidogrel (PLAVIX) 75 MG tablet TAKE 1 TABLET BY MOUTH EVERY DAY 30 tablet 6  . lisinopril-hydrochlorothiazide (PRINZIDE,ZESTORETIC) 20-12.5 MG  tablet Take 0.5 tablets by mouth daily. 45 tablet 3  . sertraline (ZOLOFT) 50 MG tablet Take 1 tablet (50 mg total) by mouth daily. 90 tablet 3   No current facility-administered medications for this visit.    Allergies as of 10/23/2015 - Review Complete 10/23/2015  Allergen Reaction Noted  . Codeine Other (See Comments) 09/27/2014    Vitals: BP 148/82 mmHg  Pulse 65  Ht  (1.549 m)  Wt 199 lb (90.266 kg)  BMI 37.62 kg/m2  SpO2 97% Last Weight:  Wt Readings from Last 1 Encounters:  10/23/15 199 lb (90.266 kg)   Last Height:   Ht Readings from Last 1 Encounters:  10/23/15  (1.549 m)     Speech: New difficulty with speech, apraxia much improved today. Cognition:  The patient is oriented to person, place, and time;   Cranial Nerves:  The pupils are equal, round, and reactive to light. Visual fields are full to finger confrontation. Extraocular movements are intact. Trigeminal sensation is intact and the muscles of mastication are normal. The face is symmetric. The palate elevates in the midline. Hearing intact. Voice is normal. Shoulder shrug is normal. The tongue  has normal motion without fasciculations.   Coordination:  Mild fine motor difficulty with the right hand.   Gait:  Heel-toe and tandem gait are normal.   Motor Observation:  No asymmetry, no atrophy, and no involuntary movements noted. Tone:  Normal muscle tone.   Posture:  Posture is normal. normal erect   Strength: Mild to moderate weakness right UE from her previous stroke with repetitive use. Decreased grip right hand. Otherwise strength is V/V in the upper and lower limbs.   Assessment/Plan: Ms. Shawna Waters is a 60 y.o. 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 Today she presents with new onset word finding difficulties and difficulty with speech. Feel we should repeat MRI of the  brain to ensure no new etiology. Repeat MRi of the brain was negative for new etiology. She is much improved, speech therapy helped, walking 3 miles a day.   Stroke: Dominant left PCA branch (temporal & occipital) infarcts secondary to L PCA stenosis without other intracranial atherosclerosis. Stenosis felt to be embolic secondary to unknown source  Resultant Memory deficits, right hemiparesis improved, right paresthesia  MRI Acute nonhemorrhagic infarct within the inferomedial left temporal lobe is confirmed. 2. At least 3 additional punctate nonhemorrhagic infarcts are evident within the posterior left temporal lobe and occipital pole. 3. Abnormal flow signal in the proximal left PCA compatible with the area of high-grade stenosis on the CTA.  MRA Unremarkable except for L PCA stenosis  Carotid Doppler. No significant stenosis  2D Echo and TEE. No source of embolus. Loop recorder implanted.  HgbA1c 5.6, at goal  Changed to Plavix  daily  Ongoing aggressive stroke risk factor management  Exercise encouraged with focus on cardiac; weight loss recommended.  LDL 139, goal < 70. Continue Lipitor  Close management with pcp to control vascular risk factors  Dizziness is likely due to blood pressure medication and possibly some dehydration, orthostatic hypotension- f/u with pcp for medication management, advised to increase fluids. Now resolved.  ESS 7, no need for sleep eval.  Follow-up with primary care in the future. Physical therapy for right arm weakness and pain s/p left stroke. Occupation therapy for right hand fine motor difficulty and weakness.   Naomie Dean, MD  [facility] [address] [place], [place] [address]  Phone [phone] Fax [phone]  A total of 30 minutes was spent face-to-face with this patient. Over half this time was spent on counseling patient on the new onset of word finding difficulties diagnosis and  different diagnostic and therapeutic options available.

## 2015-10-24 ENCOUNTER — Ambulatory Visit: Payer: BC Managed Care – PPO

## 2015-10-29 ENCOUNTER — Ambulatory Visit (INDEPENDENT_AMBULATORY_CARE_PROVIDER_SITE_OTHER): Payer: BC Managed Care – PPO | Admitting: *Deleted

## 2015-10-29 DIAGNOSIS — I638 Other cerebral infarction: Secondary | ICD-10-CM

## 2015-10-29 DIAGNOSIS — I6389 Other cerebral infarction: Secondary | ICD-10-CM

## 2015-10-31 NOTE — Progress Notes (Signed)
Carelink Summary Report / Loop Recorder 

## 2015-11-08 ENCOUNTER — Ambulatory Visit
Admission: RE | Admit: 2015-11-08 | Discharge: 2015-11-08 | Disposition: A | Discharge status: Home / Self Care | Payer: BC Managed Care – PPO | Source: Ambulatory Visit | Attending: Physician Assistant | Admitting: Physician Assistant

## 2015-11-08 DIAGNOSIS — Z1231 Encounter for screening mammogram for malignant neoplasm of breast: Secondary | ICD-10-CM

## 2015-11-28 ENCOUNTER — Ambulatory Visit (INDEPENDENT_AMBULATORY_CARE_PROVIDER_SITE_OTHER): Payer: BC Managed Care – PPO | Admitting: *Deleted

## 2015-11-28 ENCOUNTER — Ambulatory Visit: Payer: BC Managed Care – PPO | Admitting: Physical Therapy

## 2015-11-28 DIAGNOSIS — I6389 Other cerebral infarction: Secondary | ICD-10-CM

## 2015-11-28 DIAGNOSIS — I638 Other cerebral infarction: Secondary | ICD-10-CM

## 2015-11-29 NOTE — Progress Notes (Signed)
Carelink Summary Report / Loop Recorder 

## 2015-12-21 LAB — CUP PACEART REMOTE DEVICE CHECK: MDC IDC SESS DTM: 20170405000538

## 2015-12-21 NOTE — Progress Notes (Signed)
Carelink summary report received. Battery status OK. Normal device function. No new symptom episodes, tachy episodes, brady, or pause episodes. No new AF episodes. Monthly summary reports and ROV/PRN 

## 2015-12-30 ENCOUNTER — Ambulatory Visit (INDEPENDENT_AMBULATORY_CARE_PROVIDER_SITE_OTHER): Payer: BC Managed Care – PPO | Admitting: *Deleted

## 2015-12-30 DIAGNOSIS — I638 Other cerebral infarction: Secondary | ICD-10-CM

## 2015-12-30 DIAGNOSIS — I6389 Other cerebral infarction: Secondary | ICD-10-CM

## 2015-12-30 NOTE — Progress Notes (Signed)
Carelink Summary Report / Loop Recorder 

## 2016-01-05 LAB — CUP PACEART REMOTE DEVICE CHECK: Date Time Interrogation Session: 20170505003612

## 2016-01-05 NOTE — Progress Notes (Signed)
Carelink summary report received. Battery status OK. Normal device function. No new symptom episodes, tachy episodes, brady, or pause episodes. No new AF episodes. Monthly summary reports and ROV/PRN 

## 2016-01-27 ENCOUNTER — Ambulatory Visit (INDEPENDENT_AMBULATORY_CARE_PROVIDER_SITE_OTHER): Payer: BC Managed Care – PPO | Admitting: *Deleted

## 2016-01-27 DIAGNOSIS — I638 Other cerebral infarction: Secondary | ICD-10-CM

## 2016-01-27 DIAGNOSIS — I6389 Other cerebral infarction: Secondary | ICD-10-CM

## 2016-01-29 LAB — CUP PACEART REMOTE DEVICE CHECK: MDC IDC SESS DTM: 20170604010916

## 2016-01-29 NOTE — Progress Notes (Signed)
Carelink Summary Report / Loop Recorder 

## 2016-02-11 LAB — CUP PACEART REMOTE DEVICE CHECK: Date Time Interrogation Session: 20170704013652

## 2016-02-21 ENCOUNTER — Telehealth: Payer: Self-pay | Admitting: Cardiology

## 2016-02-21 NOTE — Telephone Encounter (Signed)
Spoke w/ pt and requested that she send a manual transmission b/c her home monitor has not updated in at least 14 days.   

## 2016-02-26 ENCOUNTER — Ambulatory Visit (INDEPENDENT_AMBULATORY_CARE_PROVIDER_SITE_OTHER): Payer: BC Managed Care – PPO | Admitting: *Deleted

## 2016-02-26 DIAGNOSIS — I638 Other cerebral infarction: Secondary | ICD-10-CM | POA: Diagnosis not present

## 2016-02-26 DIAGNOSIS — I6389 Other cerebral infarction: Secondary | ICD-10-CM

## 2016-02-28 NOTE — Progress Notes (Signed)
Carelink Summary Report / Loop Recorder 

## 2016-03-09 LAB — CUP PACEART REMOTE DEVICE CHECK: MDC IDC SESS DTM: 20170803020610

## 2016-03-27 ENCOUNTER — Ambulatory Visit (INDEPENDENT_AMBULATORY_CARE_PROVIDER_SITE_OTHER): Payer: BC Managed Care – PPO | Admitting: *Deleted

## 2016-03-27 DIAGNOSIS — I638 Other cerebral infarction: Secondary | ICD-10-CM | POA: Diagnosis not present

## 2016-03-27 DIAGNOSIS — I6389 Other cerebral infarction: Secondary | ICD-10-CM

## 2016-03-31 NOTE — Progress Notes (Signed)
Carelink Summary Report / Loop Recorder 

## 2016-04-20 LAB — CUP PACEART REMOTE DEVICE CHECK: Date Time Interrogation Session: 20170902020744

## 2016-04-21 ENCOUNTER — Encounter: Payer: Self-pay | Admitting: Physician Assistant

## 2016-04-21 ENCOUNTER — Ambulatory Visit (INDEPENDENT_AMBULATORY_CARE_PROVIDER_SITE_OTHER): Payer: BC Managed Care – PPO | Admitting: Physician Assistant

## 2016-04-21 VITALS — BP 122/80 | HR 84 | Temp 98.6°F | Resp 18 | Ht 61.0 in | Wt 205.0 lb

## 2016-04-21 DIAGNOSIS — R739 Hyperglycemia, unspecified: Secondary | ICD-10-CM | POA: Diagnosis not present

## 2016-04-21 DIAGNOSIS — I10 Essential (primary) hypertension: Secondary | ICD-10-CM | POA: Diagnosis not present

## 2016-04-21 DIAGNOSIS — Z1211 Encounter for screening for malignant neoplasm of colon: Secondary | ICD-10-CM | POA: Diagnosis not present

## 2016-04-21 DIAGNOSIS — Z23 Encounter for immunization: Secondary | ICD-10-CM

## 2016-04-21 DIAGNOSIS — E785 Hyperlipidemia, unspecified: Secondary | ICD-10-CM | POA: Diagnosis not present

## 2016-04-21 LAB — COMPREHENSIVE METABOLIC PANEL
ALT: 18 U/L (ref 6–29)
AST: 25 U/L (ref 10–35)
Albumin: 4.1 g/dL (ref 3.6–5.1)
Alkaline Phosphatase: 74 U/L (ref 33–130)
BUN: 11 mg/dL (ref 7–25)
CHLORIDE: 104 mmol/L (ref 98–110)
CO2: 25 mmol/L (ref 20–31)
CREATININE: 0.81 mg/dL (ref 0.50–1.05)
Calcium: 9.4 mg/dL (ref 8.6–10.4)
GLUCOSE: 92 mg/dL (ref 65–99)
Potassium: 4.5 mmol/L (ref 3.5–5.3)
SODIUM: 140 mmol/L (ref 135–146)
TOTAL PROTEIN: 7 g/dL (ref 6.1–8.1)
Total Bilirubin: 0.7 mg/dL (ref 0.2–1.2)

## 2016-04-21 LAB — CBC WITH DIFFERENTIAL/PLATELET
BASOS ABS: 0 {cells}/uL (ref 0–200)
Basophils Relative: 0 %
Eosinophils Absolute: 110 cells/uL (ref 15–500)
Eosinophils Relative: 2 %
HEMATOCRIT: 40.2 % (ref 35.0–45.0)
HEMOGLOBIN: 13.7 g/dL (ref 11.7–15.5)
LYMPHS ABS: 1155 {cells}/uL (ref 850–3900)
LYMPHS PCT: 21 %
MCH: 32 pg (ref 27.0–33.0)
MCHC: 34.1 g/dL (ref 32.0–36.0)
MCV: 93.9 fL (ref 80.0–100.0)
MONO ABS: 440 {cells}/uL (ref 200–950)
MPV: 9.8 fL (ref 7.5–12.5)
Monocytes Relative: 8 %
NEUTROS PCT: 69 %
Neutro Abs: 3795 cells/uL (ref 1500–7800)
Platelets: 272 10*3/uL (ref 140–400)
RBC: 4.28 MIL/uL (ref 3.80–5.10)
RDW: 13 % (ref 11.0–15.0)
WBC: 5.5 10*3/uL (ref 3.8–10.8)

## 2016-04-21 LAB — LIPID PANEL
Cholesterol: 146 mg/dL (ref 125–200)
HDL: 43 mg/dL — AB (ref >=46)
LDL CALC: 71 mg/dL (ref <130)
Total CHOL/HDL Ratio: 3.4 Ratio (ref <=5.0)
Triglycerides: 159 mg/dL — ABNORMAL HIGH (ref <150)
VLDL: 32 mg/dL — AB (ref <30)

## 2016-04-21 NOTE — Patient Instructions (Signed)
     IF you received an x-ray today, you will receive an invoice from [facility]. Please contact [facility] at [phone] with questions or concerns regarding your invoice.   IF you received labwork today, you will receive an invoice from [facility]. Please contact [facility] at [phone] with questions or concerns regarding your invoice.   Our billing staff will not be able to assist you with questions regarding bills from these companies.  You will be contacted with the lab results as soon as they are available. The fastest way to get your results is to activate your My Chart account. Instructions are located on the last [place] of this paperwork. If you have not heard from us regarding the results in 2 weeks, please contact this office.      

## 2016-04-21 NOTE — Progress Notes (Signed)
Patient ID: Shawna Waters, female    DOB: [birth year], 60 y.o.   MRN: [id number]  PCP: Porfirio Oar, PA-C  Subjective:   Chief Complaint  Patient presents with  . Follow-up    6 month    HPI Presents for evaluation of HTN.   She continues to do well, walking 3 miles daily. Sees neurology tomorrow for follow-up of CVA 09/2014.  Occasionally, when she is working hard, with repetitive RIGHT hand movement, she develops tingling in the RIGHT hand and RIGHT side of the face. It resolves with rest.  No weakness, HA, visual changes, speech changes. No CP, SOB, dizziness. No GU/GI symptoms.  She continues to tolerate all her medications without adverse effects.   Review of Systems As above.    Patient Active Problem List   Diagnosis Date Noted  . 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 Acute ischemic stroke (HCC) 10/03/2014  . Hyperlipidemia LDL goal <100 10/03/2014  . TIA (transient ischemic attack) 09/27/2014  . HTN (hypertension) 09/27/2014  . Hyperglycemia   . BMI 37.0-37.9, adult      Prior to Admission medications   Medication Sig Start Date End Date Taking? Authorizing Provider  atorvastatin (LIPITOR) 20 MG tablet TAKE 1 TABLET (20 MG TOTAL) BY MOUTH DAILY AT 6 PM. 10/23/15  Yes Anson Fret, MD  Cholecalciferol (VITAMIN D) 2000 UNITS tablet Take 2,000 Units by mouth daily.   Yes Historical Provider, MD  clopidogrel (PLAVIX) 75 MG tablet Take 1 tablet (75 mg total) by mouth daily. 10/23/15  Yes Anson Fret, MD  lisinopril-hydrochlorothiazide (PRINZIDE,ZESTORETIC) 20-12.5 MG tablet Take 0.5 tablets by mouth daily. 09/20/15  Yes Shivali Quackenbush, PA-C  sertraline (ZOLOFT) 50 MG tablet Take 1 tablet (50 mg total) by mouth daily. 09/20/15  Yes Porfirio Oar, PA-C     Allergies  Allergen Reactions  . Codeine Other (See Comments)    Husband & patient unaware of reaction.          Objective:  Physical Exam  Constitutional: She is oriented to person, place, and time. She appears well-developed and well-nourished. She is active and cooperative. No distress.  BP 122/80 (BP Location: Right Arm, Patient Position: Sitting, Cuff Size: Large)   Pulse 84   Temp 98.6 F (37 C) (Oral)   Resp 18   Ht 5\' 1"  (1.549 m)   Wt 205 lb (93 kg)   SpO2 99%   BMI 38.73 kg/m   HENT:  Head: Normocephalic and atraumatic.  Right Ear: Hearing normal.  Left Ear: Hearing normal.  Eyes: Conjunctivae are normal. No scleral icterus.  Neck: Normal range of motion. Neck supple. No thyromegaly present.  Cardiovascular: Normal rate, regular rhythm and normal heart sounds.   Pulses:      Radial pulses are 2+ on the right side, and 2+ on the left side.  Pulmonary/Chest: Effort normal and breath sounds normal.  Lymphadenopathy:       Head (right side): No tonsillar, no preauricular, no posterior auricular and no occipital adenopathy present.       Head (left side): No tonsillar, no preauricular, no posterior auricular and no occipital adenopathy present.    She has no cervical adenopathy.       Right: No supraclavicular adenopathy present.       Left: No supraclavicular adenopathy present.  Neurological: She is alert and oriented to person, place, and time. No sensory deficit.  Skin: Skin is warm, dry and intact. No rash noted. No cyanosis or erythema. Nails show no clubbing.  Psychiatric: She has a normal mood and affect. Her speech is normal and behavior is normal.           Assessment & Plan:   1. Hyperlipidemia LDL goal <100 Await labs. Adjust regimen as indicated by results. - Lipid panel - Comprehensive metabolic panel  2. Essential hypertension Controlled. Continue current treatment. - CBC with Differential/Platelet  3. Hyperglycemia A1C last visit was 5.5%. Continue healthy lifestyle changes for risk reduction. - Comprehensive metabolic panel  4. Encounter for  screening colonoscopy - Ambulatory referral to Gastroenterology  5. Need for Tdap vaccination - Tdap vaccine greater than or equal to 7yo IM; Future    Return in about 6 months (around 10/19/2016).    Fernande Brashelle S. Brittan Butterbaugh, PA-C Physician Assistant-Certified [facility]

## 2016-04-21 NOTE — Progress Notes (Signed)
Subjective:    Patient ID: Shawna FredricksonVickie B Sease, female    DOB: Dec 04, 1955, 60 y.o.   MRN: [id number] Chief Complaint  Patient presents with  . Follow-up    6 month    HPI Patient with hx of stroke presents today for a 6 month follow-up for HTN. The stroke occurred March 2016 that affected the right side of her body. Since that incident the patient reports occational numbness on the right side of her face and right hand. Overall patient reports she is in good health and is walking 3 miles daily.   Past Medical History:  Diagnosis Date  . Encephalitis   . Hypertension   . Stroke [facility](HCC)    Family History  Problem Relation Age of Onset  . CAD Father   . Diabetes Maternal Grandmother   . Hypertension Other    Social History   Social History  . Marital status: Married    Spouse name: RIchard N. Rueda  . Number of children: 1  . Years of education: College   Occupational History  . 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 Smoking status: Never Smoker  . Smokeless tobacco: Never Used  . Alcohol use No  . Drug use: No  . Sexual activity: Not on file   Other Topics Concern  . Not on file   Social History Narrative   Lives at home with husband. Adult son lives with them.   Right handed.   Caffeine use: none   Current Outpatient Prescriptions on File Prior to Visit  Medication Sig Dispense Refill  . atorvastatin (LIPITOR) 20 MG tablet TAKE 1 TABLET (20 MG TOTAL) BY MOUTH DAILY AT 6 PM. 30 tablet 6  . Cholecalciferol (VITAMIN D) 2000 UNITS tablet Take 2,000 Units by mouth daily.    . clopidogrel (PLAVIX) 75 MG tablet Take 1 tablet (75 mg total) by mouth daily. 30 tablet 6  . lisinopril-hydrochlorothiazide (PRINZIDE,ZESTORETIC) 20-12.5 MG tablet Take 0.5 tablets by mouth daily. 45 tablet 3  . sertraline (ZOLOFT) 50 MG tablet Take 1 tablet (50 mg total) by mouth daily. 90 tablet 3   No current facility-administered medications on file  prior to visit.       Review of Systems All review of systems negative except those stated above     Objective:   Physical Exam  Constitutional: She appears well-developed and well-nourished. No distress.  HENT:  Head: Normocephalic and atraumatic.  Right Ear: External ear normal.  Left Ear: External ear normal.  Mouth/Throat: Oropharynx is clear and moist. No oropharyngeal exudate.  Eyes: Conjunctivae and EOM are normal. Pupils are equal, round, and reactive to light. Right eye exhibits no discharge.  Neck: Neck supple. No thyromegaly present.  Cardiovascular: Normal rate, regular rhythm, normal heart sounds and intact distal pulses.  Exam reveals no gallop and no friction rub.   No murmur heard. Pulmonary/Chest: Effort normal and breath sounds normal. No respiratory distress. She exhibits no tenderness.  Abdominal: Soft. Bowel sounds are normal.  Lymphadenopathy:    She has no cervical adenopathy.  Neurological: She is alert. She displays normal reflexes. No cranial nerve deficit. She exhibits normal muscle tone.  Skin: Skin is warm and dry. No rash noted. She is not diaphoretic.  Psychiatric: She has a normal mood and affect. Her behavior is normal.       Assessment & Plan:  1. Hyperlipidemia LDL goal <100 Most recent lipid panel shows  improvement of cholesterol numbers. Will continue current therapy  - Lipid panel - Comprehensive metabolic panel  2. Essential hypertension Stable, will continue current therapy - CBC with Differential/Platelet  3. Hyperglycemia  - Comprehensive metabolic panel  4. Encounter for screening colonoscopy  - Ambulatory referral to Gastroenterology  5. Need for Tdap vaccination  - Tdap vaccine greater than or equal to 7yo IM; Future

## 2016-04-27 ENCOUNTER — Ambulatory Visit (INDEPENDENT_AMBULATORY_CARE_PROVIDER_SITE_OTHER): Payer: BC Managed Care – PPO | Admitting: *Deleted

## 2016-04-27 ENCOUNTER — Encounter: Payer: Self-pay | Admitting: Cardiology

## 2016-04-27 ENCOUNTER — Ambulatory Visit (INDEPENDENT_AMBULATORY_CARE_PROVIDER_SITE_OTHER): Payer: BC Managed Care – PPO | Admitting: Neurology

## 2016-04-27 ENCOUNTER — Encounter: Payer: Self-pay | Admitting: Neurology

## 2016-04-27 VITALS — BP 129/82 | HR 78 | Temp 97.7°F | Ht 61.0 in | Wt 204.6 lb

## 2016-04-27 DIAGNOSIS — J0100 Acute maxillary sinusitis, unspecified: Secondary | ICD-10-CM

## 2016-04-27 DIAGNOSIS — I63412 Cerebral infarction due to embolism of left middle cerebral artery: Secondary | ICD-10-CM

## 2016-04-27 DIAGNOSIS — I6389 Other cerebral infarction: Secondary | ICD-10-CM

## 2016-04-27 DIAGNOSIS — I638 Other cerebral infarction: Secondary | ICD-10-CM

## 2016-04-27 MED ORDER — AMOXICILLIN-POT CLAVULANATE 875-125 MG PO TABS: 1.0000 | ORAL_TABLET | Freq: Two times a day (BID) | ORAL | 0 refills | Status: DC

## 2016-04-27 MED ORDER — BENZONATATE 100 MG PO CAPS: 100.0000 mg | ORAL_CAPSULE | Freq: Three times a day (TID) | ORAL | 1 refills | Status: DC | PRN

## 2016-04-27 NOTE — Patient Instructions (Signed)
Remember to drink plenty of fluid, eat healthy meals and do not skip any meals. Try to eat protein with a every meal and eat a healthy snack such as fruit or nuts in between meals. Try to keep a regular sleep-wake schedule and try to exercise daily, particularly in the form of walking, 20-30 minutes a day, if you can. .   Our phone number is [phone]. We also have an after hours call service for urgent matters and there is a physician on-call for urgent questions. For any emergencies you know to call 911 or go to the nearest emergency room   

## 2016-04-27 NOTE — Progress Notes (Signed)
Carelink Summary Report / Loop Recorder 

## 2016-04-27 NOTE — Progress Notes (Signed)
[facility]    Provider:  Dr Lucia Gaskins Referring Provider: Porfirio Oar, PA-C Primary Care Physician:  Porfirio Oar, PA-C  Provider:  Dr Lucia Gaskins Referring Provider: Pearline Cables, MD Primary Care Physician:  Olene Floss  CC: Hospital follow up   Interval f/u 04/27/2016:  She is doing very well. She is walking 3 miles a day. She has a lot of drainage, she is coughing  Lot, lots of drainage, getting worse, sinus congestion.   She continues to have numbness in the right side of the face which is intermittent tingling and weakness of the right upper extremity. She reports that both her fatigue and memory loss are better. She returns for reevaluation. She has not had further  No new symptoms. Overall she is doing well.    Interval f/u 10/23/2015:  She continues to have numbness in the right side of the face which is intermittent tingling and weakness of the right upper extremity. She reports that both her fatigue and memory loss are better. She returns for reevaluation. She has not had further stroke or TIA symptoms. She is walking 3 miles every day. She has not returned to work however she does volunteering. She feels better, she feels her speech is better. She is still on the plavix without problems. She snores but not excessively and no witnessed apneic events. No excessively tired during the day. She has more energy. She is a restless sleeper. She goes to bed at 10am and wakes up 7:30am. No morning headaches.Repeat MRi of the brain did not show any new etiology. She has crazy dreams.   Interval history 01/21/2015: She is here with husband. She is having issues getting words out, stuttering, word-finding difficulty. This is new, she didn't have this previously. Endorses compliance with medications. Her speech difficulties started a month ago and they thought it was late effect due to the stroke but thought it was odd for new symptoms after stroke. Acute onset of  word-finding difficulty and stuttering. No other new focal neurologic symptoms  Initial visit 10/17/2014: Shawna Waters is a 60 y.o. female here as a referral from Dr. Audria Nine for hospital follow up after admission for stroke workup. PMHx HTN, HLD, Obesity. She was admitted on 3/3 for acute onset right-sided weakness and numbness, diagnosed with a TIA. She was seen on 3/7 again in the ED for similar symptoms and sent home. She was admitted on 3/8 after another episode and CT showed probable acute left medial temporal occipital ischemic infarction.Marland Kitchen She was not on aspirin or stroke prevention before March 3rd, March 3rd started taking the aspirin. She was changed to Plavix. She is now on Lipitor. Symptoms are getting better. The arm is still a little weak, but she is improved. She feels light headed. Walks three times a day. She feels lightheaded when she gets up. Dizziness better since decreasing the blood pressure medication. She is struggling with anxiety and depression. Symptoms started acutely. Currently she has minimal symptoms. Not interfering with her ADLs or IADLs, and no other associated symptoms except for the dizziness that probably is due to low BP. She is working with pcp on this.    Reviewed notes, labs and imaging from outside physicians, which showed: Patient was admitted on 09/27/2014 for , intermittent episodes of right arm weakness/heaviness sensation, numbness, lightheadedness and blurred vision. 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 Diagnosed with a TIA and discharged on ASA, BP meds (  due to HTN), . She was seen again in the emergency room with similar symptoms on 10/01/2014. No focal deficits were noted and she was sent home. She had several episodes of right-sided numbness involving ascending arm and subsequently involving right leg. She presented again on 3/8. At that time, CT showed probable acute  left medial temporal occipital ischemic infarction. Admitted for workup. TEE and echo did not show thrombus. HgbA1c 5.6. LDL 139. She was switched to plavix. Loop recorder implanted as left PCA flow signal thought to be due to embolus.  MR Brain (personally reviewed images, agree with findings) IMPRESSION: 1. Acute nonhemorrhagic infarct within the inferomedial left temporal lobe is confirmed. 2. At least 3 additional punctate nonhemorrhagic infarcts are evident within the posterior left temporal lobe and occipital pole. 3. Abnormal flow signal in the proximal left PCA compatible with the area of high-grade stenosis on the CTA. Review of Systems: Patient complains of symptoms per HPI as well as the following symptoms: Fatigue, speech difficulty, memory loss, numbness, fatigue, excessive thirst. Pertinent negatives per HPI. All others negative. negative.   Social History   Social History  . Marital status: Married    Spouse name: RIchard N. Boxwell  . Number of children: 1  . Years of education: College   Occupational History  . 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 Smoking status: Never Smoker  . Smokeless tobacco: Never Used  . Alcohol use No  . Drug use: No  . Sexual activity: Not on file   Other Topics Concern  . Not on file   Social History Narrative   Lives at home with husband. Adult son lives with them.   Right handed.   Caffeine use: none    Family History  Problem Relation Age of Onset  . CAD Father   . Diabetes Maternal Grandmother   . Hypertension Other     Past Medical History:  Diagnosis Date  . Encephalitis   . Hypertension   . Stroke [facility])     Past Surgical History:  Procedure Laterality Date  . ABDOMINAL HYSTERECTOMY    . LOOP RECORDER IMPLANT N/A 10/04/2014   Procedure: LOOP RECORDER IMPLANT;  Surgeon: Hillis Range, MD;  Location: [facility] CATH LAB;  Service: Cardiovascular;  Laterality: N/A;  . TEE  WITHOUT CARDIOVERSION N/A 10/04/2014   Procedure: TRANSESOPHAGEAL ECHOCARDIOGRAM (TEE);  Surgeon: Laurey Morale, MD;  Location: [facility] ENDOSCOPY;  Service: Cardiovascular;  Laterality: N/A;    Current Outpatient Prescriptions  Medication Sig Dispense Refill  . atorvastatin (LIPITOR) 20 MG tablet TAKE 1 TABLET (20 MG TOTAL) BY MOUTH DAILY AT 6 PM. 30 tablet 6  . Cholecalciferol (VITAMIN D) 2000 UNITS tablet Take 2,000 Units by mouth daily.    . clopidogrel (PLAVIX) 75 MG tablet Take 1 tablet (75 mg total) by mouth daily. 30 tablet 6  . lisinopril-hydrochlorothiazide (PRINZIDE,ZESTORETIC) 20-12.5 MG tablet Take 0.5 tablets by mouth daily. 45 tablet 3  . sertraline (ZOLOFT) 50 MG tablet Take 1 tablet (50 mg total) by mouth daily. 90 tablet 3   No current facility-administered medications for this visit.     Allergies as of 04/27/2016 - Review Complete 04/27/2016  Allergen Reaction Noted  . Codeine Other (See Comments) 09/27/2014    Vitals: BP 129/82 (BP Location: Right Arm, Patient Position: Sitting, Cuff Size: Normal)   Pulse 78   Temp 97.7 F (36.5 C) (Oral)   Ht 5\' 1"  (1.549 m)  Wt 204 lb 9.6 oz (92.8 kg)   BMI 38.66 kg/m  Last Weight:  Wt Readings from Last 1 Encounters:  04/27/16 204 lb 9.6 oz (92.8 kg)   Last Height:   Ht Readings from Last 1 Encounters:  04/27/16 5\' 1"  (1.549 m)     Speech: New difficulty with speech, apraxia much improved today. Cognition:  The patient is oriented to person, place, and time;   Cranial Nerves:  The pupils are equal, round, and reactive to light. Visual fields are full to finger confrontation. Extraocular movements are intact. Trigeminal sensation is intact and the muscles of mastication are normal. The face is symmetric. The palate elevates in the midline. Hearing intact. Voice is normal. Shoulder shrug is normal. The tongue has normal motion without fasciculations.   Coordination:  Mild fine motor difficulty with the  right hand.   Gait:  Heel-toe and tandem gait are normal.   Motor Observation:  No asymmetry, no atrophy, and no involuntary movements noted. Tone:  Normal muscle tone.   Posture:  Posture is normal. normal erect   Strength: Mild to moderate weakness right UE from her previous stroke with repetitive use. Decreased grip right hand. Otherwise strength is V/V in the upper and lower limbs.   Assessment/Plan: Ms. Norma FredricksonVickie B Marrocco is a 60 y.o. 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 Today she presents with new onset word finding difficulties and difficulty with speech. Feel we should repeat MRI of the brain to ensure no new etiology. Repeat MRi of the brain was negative for new etiology. She is much improved, speech therapy helped, walking 3 miles a day.   Stroke: Dominant left PCA branch (temporal & occipital) infarcts secondary to L PCA stenosis without other intracranial atherosclerosis. Stenosis felt to be embolic secondary to unknown source  Resultant Memory deficits, right hemiparesis improved, right paresthesia  MRI Acute nonhemorrhagic infarct within the inferomedial left temporal lobe is confirmed. 2. At least 3 additional punctate nonhemorrhagic infarcts are evident within the posterior left temporal lobe and occipital pole. 3. Abnormal flow signal in the proximal left PCA compatible with the area of high-grade stenosis on the CTA.  MRA Unremarkable except for L PCA stenosis  Carotid Doppler. No significant stenosis  2D Echo and TEE. No source of embolus. Loop recorder implanted.  HgbA1c 5.6, at goal  Changed to Plavix 75mg  daily  Ongoing aggressive stroke risk factor management  Exercise encouraged with focus on cardiac; weight loss recommended.  LDL 139, goal < 70. Continue Lipitor  Close management with pcp to control vascular risk factors  Dizziness is  likely due to blood pressure medication and possibly some dehydration, orthostatic hypotension- f/u with pcp for medication management, advised to increase fluids. Now resolved.  ESS 7, no need for sleep eval.  Follow-up with primary care in the future. Physical therapy for right arm weakness and pain s/p left stroke. Occupation therapy for right hand fine motor difficulty and weakness.   Naomie DeanAntonia Secret Kristensen, MD  [facility] [address] [place], [place] [address]  Phone [phone] Fax [phone]  A total of 15 minutes was spent face-to-face with this patient. Over half this time was spent on counseling patient on the new onset of word finding difficulties diagnosis and different diagnostic and therapeutic options available.

## 2016-04-27 NOTE — Progress Notes (Signed)
Opened in error

## 2016-05-05 ENCOUNTER — Encounter: Payer: Self-pay | Admitting: Physician Assistant

## 2016-05-26 ENCOUNTER — Ambulatory Visit (INDEPENDENT_AMBULATORY_CARE_PROVIDER_SITE_OTHER): Payer: BC Managed Care – PPO | Admitting: *Deleted

## 2016-05-26 DIAGNOSIS — I6389 Other cerebral infarction: Secondary | ICD-10-CM

## 2016-05-26 DIAGNOSIS — I638 Other cerebral infarction: Secondary | ICD-10-CM | POA: Diagnosis not present

## 2016-05-27 NOTE — Progress Notes (Signed)
Carelink Summary Report / Loop Recorder 

## 2016-06-05 LAB — CUP PACEART REMOTE DEVICE CHECK
Date Time Interrogation Session: 20171002024023
Implantable Pulse Generator Implant Date: 20160310

## 2016-06-05 NOTE — Progress Notes (Signed)
Carelink summary report received. Battery status OK. Normal device function. No new symptom episodes, tachy episodes, or brady episodes. 1 pause- available ECG appears vent. undersensing, see ECG. No new AF episodes. Monthly summary reports and ROV/PRN 

## 2016-06-25 ENCOUNTER — Ambulatory Visit (INDEPENDENT_AMBULATORY_CARE_PROVIDER_SITE_OTHER): Payer: BC Managed Care – PPO | Admitting: *Deleted

## 2016-06-25 DIAGNOSIS — I6389 Other cerebral infarction: Secondary | ICD-10-CM

## 2016-06-25 DIAGNOSIS — I638 Other cerebral infarction: Secondary | ICD-10-CM

## 2016-06-26 NOTE — Progress Notes (Signed)
Carelink Summary Report / Loop Recorder 

## 2016-07-04 LAB — CUP PACEART REMOTE DEVICE CHECK
Date Time Interrogation Session: 20171101024233
MDC IDC PG IMPLANT DT: 20160310

## 2016-07-04 NOTE — Progress Notes (Signed)
Carelink summary report received. Battery status OK. Normal device function. No new symptom episodes, tachy episodes, brady, or pause episodes. No new AF episodes. Monthly summary reports and ROV/PRN 

## 2016-07-20 ENCOUNTER — Other Ambulatory Visit: Payer: Self-pay | Admitting: Neurology

## 2016-07-22 ENCOUNTER — Telehealth: Payer: Self-pay

## 2016-07-22 DIAGNOSIS — I10 Essential (primary) hypertension: Secondary | ICD-10-CM

## 2016-07-22 DIAGNOSIS — IMO0002 Reserved for concepts with insufficient information to code with codable children: Secondary | ICD-10-CM

## 2016-07-22 NOTE — Telephone Encounter (Signed)
Pt is needing a refill on lipitor, plavix, lisinopril, and zoloft   Best number is [phone]

## 2016-07-23 ENCOUNTER — Other Ambulatory Visit: Payer: Self-pay | Admitting: Physician Assistant

## 2016-07-23 MED ORDER — SERTRALINE HCL 50 MG PO TABS: 50.0000 mg | ORAL_TABLET | Freq: Every day | ORAL | 0 refills | Status: DC

## 2016-07-23 MED ORDER — ATORVASTATIN CALCIUM 20 MG PO TABS: ORAL_TABLET | ORAL | 0 refills | Status: DC

## 2016-07-23 MED ORDER — CLOPIDOGREL BISULFATE 75 MG PO TABS: 75.0000 mg | ORAL_TABLET | Freq: Every day | ORAL | 0 refills | Status: DC

## 2016-07-23 MED ORDER — LISINOPRIL-HYDROCHLOROTHIAZIDE 20-12.5 MG PO TABS: 0.5000 | ORAL_TABLET | Freq: Every day | ORAL | 0 refills | Status: DC

## 2016-07-23 NOTE — Telephone Encounter (Signed)
Pt is checking on status of a refill request on lipitor lisinopril and zoloft and plavix  Best number [phone]

## 2016-07-23 NOTE — Telephone Encounter (Signed)
Notified pt all 4 Rxs were sent in.

## 2016-07-23 NOTE — Telephone Encounter (Signed)
Sent in RFs of lisinopril and zoloft. Shawna Waters, your plan was for pt to f/up in March. Pt's neurologist has Rxd the plavix and lipitor in the past, but denied the refill requests (seen in EPIC) stating that pt needs to get her PCP to Rx these. I will pend them as on current med list. Last lipid test in Nov and you wanted pt to remain on same dose of lipitor.

## 2016-07-23 NOTE — Telephone Encounter (Signed)
Meds ordered this encounter  Medications  . lisinopril-hydrochlorothiazide (PRINZIDE,ZESTORETIC) 20-12.5 MG tablet    Sig: Take 0.5 tablets by mouth daily.    Dispense:  45 tablet    Refill:  0  . sertraline (ZOLOFT) 50 MG tablet    Sig: Take 1 tablet (50 mg total) by mouth daily.    Dispense:  90 tablet    Refill:  0  . clopidogrel (PLAVIX) 75 MG tablet    Sig: Take 1 tablet (75 mg total) by mouth daily.    Dispense:  90 tablet    Refill:  0  . atorvastatin (LIPITOR) 20 MG tablet    Sig: TAKE 1 TABLET (20 MG TOTAL) BY MOUTH DAILY AT 6 PM.    Dispense:  90 tablet    Refill:  0

## 2016-07-28 ENCOUNTER — Ambulatory Visit (INDEPENDENT_AMBULATORY_CARE_PROVIDER_SITE_OTHER): Payer: BC Managed Care – PPO | Admitting: *Deleted

## 2016-07-28 DIAGNOSIS — I638 Other cerebral infarction: Secondary | ICD-10-CM

## 2016-07-28 DIAGNOSIS — I6389 Other cerebral infarction: Secondary | ICD-10-CM

## 2016-07-29 NOTE — Progress Notes (Signed)
Carelink Summary Report 

## 2016-08-05 LAB — CUP PACEART REMOTE DEVICE CHECK
Date Time Interrogation Session: 20171201023911
Implantable Pulse Generator Implant Date: 20160310

## 2016-08-24 ENCOUNTER — Ambulatory Visit (INDEPENDENT_AMBULATORY_CARE_PROVIDER_SITE_OTHER): Payer: BC Managed Care – PPO | Admitting: *Deleted

## 2016-08-24 DIAGNOSIS — I638 Other cerebral infarction: Secondary | ICD-10-CM

## 2016-08-24 DIAGNOSIS — I6389 Other cerebral infarction: Secondary | ICD-10-CM

## 2016-08-25 NOTE — Progress Notes (Signed)
Carelink Summary Report / Loop Recorder 

## 2016-09-12 LAB — CUP PACEART REMOTE DEVICE CHECK
Date Time Interrogation Session: 20171231023948
Implantable Pulse Generator Implant Date: 20160310

## 2016-09-12 NOTE — Progress Notes (Signed)
Carelink summary report received. Battery status OK. Normal device function. No new symptom episodes, tachy episodes, brady, or pause episodes. No new AF episodes. Monthly summary reports and ROV/PRN 

## 2016-09-22 LAB — CUP PACEART REMOTE DEVICE CHECK
Date Time Interrogation Session: 20180130030703
Implantable Pulse Generator Implant Date: 20160310

## 2016-09-22 NOTE — Progress Notes (Signed)
Carelink summary report received. Battery status OK. Normal device function. No new symptom episodes, tachy episodes, brady, or pause episodes. No new AF episodes. Monthly summary reports and ROV/PRN 

## 2016-09-23 ENCOUNTER — Ambulatory Visit (INDEPENDENT_AMBULATORY_CARE_PROVIDER_SITE_OTHER): Payer: BC Managed Care – PPO | Admitting: *Deleted

## 2016-09-23 DIAGNOSIS — I6389 Other cerebral infarction: Secondary | ICD-10-CM

## 2016-09-23 DIAGNOSIS — I638 Other cerebral infarction: Secondary | ICD-10-CM

## 2016-09-24 NOTE — Progress Notes (Signed)
Carelink Summary Report / Loop Recorder 

## 2016-10-09 LAB — CUP PACEART REMOTE DEVICE CHECK
Date Time Interrogation Session: 20180301030608
Implantable Pulse Generator Implant Date: 20160310

## 2016-10-18 ENCOUNTER — Other Ambulatory Visit: Payer: Self-pay | Admitting: Physician Assistant

## 2016-10-19 NOTE — Telephone Encounter (Signed)
Has visit with me 10/20/2016.  Meds ordered this encounter  Medications  . atorvastatin (LIPITOR) 20 MG tablet    Sig: TAKE 1 TABLET (20 MG TOTAL) BY MOUTH DAILY AT 6 PM.    Dispense:  90 tablet    Refill:  1  . clopidogrel (PLAVIX) 75 MG tablet    Sig: TAKE 1 TABLET (75 MG TOTAL) BY MOUTH DAILY.    Dispense:  90 tablet    Refill:  1

## 2016-10-20 ENCOUNTER — Encounter: Payer: Self-pay | Admitting: Physician Assistant

## 2016-10-20 ENCOUNTER — Ambulatory Visit (INDEPENDENT_AMBULATORY_CARE_PROVIDER_SITE_OTHER): Payer: BC Managed Care – PPO | Admitting: Physician Assistant

## 2016-10-20 ENCOUNTER — Other Ambulatory Visit: Payer: Self-pay | Admitting: Physician Assistant

## 2016-10-20 VITALS — BP 132/81 | HR 74 | Temp 98.8°F | Ht 61.0 in | Wt 203.0 lb

## 2016-10-20 DIAGNOSIS — I10 Essential (primary) hypertension: Secondary | ICD-10-CM | POA: Diagnosis not present

## 2016-10-20 DIAGNOSIS — E559 Vitamin D deficiency, unspecified: Secondary | ICD-10-CM

## 2016-10-20 DIAGNOSIS — R739 Hyperglycemia, unspecified: Secondary | ICD-10-CM

## 2016-10-20 DIAGNOSIS — F0631 Mood disorder due to known physiological condition with depressive features: Secondary | ICD-10-CM | POA: Diagnosis not present

## 2016-10-20 DIAGNOSIS — E785 Hyperlipidemia, unspecified: Secondary | ICD-10-CM

## 2016-10-20 DIAGNOSIS — Z23 Encounter for immunization: Secondary | ICD-10-CM | POA: Diagnosis not present

## 2016-10-20 DIAGNOSIS — I639 Cerebral infarction, unspecified: Secondary | ICD-10-CM

## 2016-10-20 DIAGNOSIS — IMO0002 Reserved for concepts with insufficient information to code with codable children: Secondary | ICD-10-CM

## 2016-10-20 MED ORDER — SERTRALINE HCL 50 MG PO TABS
50.0000 mg | ORAL_TABLET | Freq: Every day | ORAL | 1 refills | Status: DC
Start: 2016-10-20 — End: 2017-04-27

## 2016-10-20 MED ORDER — LISINOPRIL-HYDROCHLOROTHIAZIDE 20-12.5 MG PO TABS: 0.5000 | ORAL_TABLET | Freq: Every day | ORAL | 1 refills | Status: DC

## 2016-10-20 NOTE — Patient Instructions (Addendum)
Keep up the GREAT work!  [facility] [address], [place], [place] [address]  [phone]     IF you received an x-ray today, you will receive an invoice from [facility]. Please contact [facility] at [phone] with questions or concerns regarding your invoice.   IF you received labwork today, you will receive an invoice from [facility]. Please contact [facility] at [phone] with questions or concerns regarding your invoice.   Our billing staff will not be able to assist you with questions regarding bills from these companies.  You will be contacted with the lab results as soon as they are available. The fastest way to get your results is to activate your My Chart account. Instructions are located on the last [place] of this paperwork. If you have not heard from us regarding the results in 2 weeks, please contact this office.

## 2016-10-20 NOTE — Assessment & Plan Note (Signed)
No symptoms of diabetes, but continue to monitor. Await lab results.

## 2016-10-20 NOTE — Assessment & Plan Note (Signed)
Controlled. Continue current treatment. 

## 2016-10-20 NOTE — Assessment & Plan Note (Signed)
Tolerating atorvastatin. Await lab results.

## 2016-10-20 NOTE — Assessment & Plan Note (Signed)
Controlled. Update labs. Continue current treatment. 

## 2016-10-20 NOTE — Assessment & Plan Note (Signed)
Await lab results. Increase vitamin D supplementation if needed.

## 2016-10-20 NOTE — Progress Notes (Signed)
Patient ID: Shawna Waters, female    DOB: [birth year], 61 y.o.   MRN: [id number]  PCP: Porfirio Oar, PA-C  Chief Complaint  Patient presents with  . Follow-up    6 mth f/u  . Medication Refill    Sertraline HCL, Clopidogrel, Lisinopril, and Atorvastatin    Subjective:   Presents for evaluation of HTN and lipids and for prescription refills.  I authorized refills for several of her medications yesterday, received via [employer]. She continues to tolerate her medications well, no adverse effects.  Working 3 days/week, teaching. Long days. As such, she isn't walking for exercise as much. Usually 2 days/week. The weather makes it more difficult, some seasonal affective symptoms, some family stressors.  Has been released from neurology, with instructions to RTC PRN, regarding a cryptogenic stroke. Occasionally has some mild dizziness, associated with increased sinus drainage. Still gets some tingling in the RIGHT face episodically, usually associated with  Fatigue. RIGHT hand tingling has resolved. Asphasia is worse with fatigue and stress. She has stuttering.  Asks about the implanted LOOP recorder, and can't recall if it's meant to monitor for 2 or 3 years. Finds it reassuring. Isn't sure if she needs to follow up with cardiology.  Will schedule mammogram. Received calls to schedule colonoscopy, but hasn't done it yet. Asks for the phone number so she can call back to schedule. Due for Tdap.   Review of Systems  Constitutional: Negative.   HENT: Negative for sore throat.   Eyes: Negative for visual disturbance.  Respiratory: Negative for cough, chest tightness, shortness of breath and wheezing.   Cardiovascular: Negative for chest pain and palpitations.  Gastrointestinal: Negative for abdominal pain, diarrhea, nausea and vomiting.  Genitourinary: Negative for dysuria, frequency, hematuria and urgency.  Musculoskeletal: Negative for arthralgias and myalgias.  Skin:  Negative for rash.  Neurological: Positive for speech difficulty (episodically, when stressed/fatigued) and numbness (RIGHT face, episodically, when stressed/fatigued). Negative for dizziness, weakness and headaches.  Psychiatric/Behavioral: Negative for decreased concentration. The patient is not nervous/anxious.        Patient Active Problem List   Diagnosis Date Noted  . 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 Acute ischemic stroke (HCC) 10/03/2014  . Hyperlipidemia LDL goal <100 10/03/2014  . TIA (transient ischemic attack) 09/27/2014  . HTN (hypertension) 09/27/2014  . Hyperglycemia   . BMI 37.0-37.9, adult      Prior to Admission medications   Medication Sig Start Date End Date Taking? Authorizing Provider  atorvastatin (LIPITOR) 20 MG tablet TAKE 1 TABLET (20 MG TOTAL) BY MOUTH DAILY AT 6 PM. 10/19/16  Yes Tomas Schamp, PA-C  Cholecalciferol (VITAMIN D) 2000 UNITS tablet Take 2,000 Units by mouth daily.   Yes Historical Provider, MD  clopidogrel (PLAVIX) 75 MG tablet TAKE 1 TABLET (75 MG TOTAL) BY MOUTH DAILY. 10/19/16  Yes Bellah Alia, PA-C  lisinopril-hydrochlorothiazide (PRINZIDE,ZESTORETIC) 20-12.5 MG tablet Take 0.5 tablets by mouth daily. 07/23/16  Yes Briannie Gutierrez, PA-C  sertraline (ZOLOFT) 50 MG tablet Take 1 tablet (50 mg total) by mouth daily. 07/23/16  Yes Porfirio Oar, PA-C     Allergies  Allergen Reactions  . Codeine Other (See Comments)    Husband & patient unaware of reaction.        Objective:  Physical Exam  Constitutional: She is oriented to person, place, and time. She appears well-developed and well-nourished. She is active and cooperative. No distress.  BP 132/81 (BP Location: Right Arm, Patient Position: Sitting, Cuff Size: Large)   Pulse 74   Temp 98.8 F (37.1 C) (Oral)   Ht 5\' 1"  (1.549 m)   Wt 203 lb (92.1 kg)   SpO2 100%   BMI 38.36 kg/m   HENT:  Head:  Normocephalic and atraumatic.  Right Ear: Hearing normal.  Left Ear: Hearing normal.  Eyes: Conjunctivae are normal. No scleral icterus.  Neck: Normal range of motion. Neck supple. No thyromegaly present.  Cardiovascular: Normal rate, regular rhythm and normal heart sounds.   Pulses:      Radial pulses are 2+ on the right side, and 2+ on the left side.  Pulmonary/Chest: Effort normal and breath sounds normal.  Lymphadenopathy:       Head (right side): No tonsillar, no preauricular, no posterior auricular and no occipital adenopathy present.       Head (left side): No tonsillar, no preauricular, no posterior auricular and no occipital adenopathy present.    She has no cervical adenopathy.       Right: No supraclavicular adenopathy present.       Left: No supraclavicular adenopathy present.  Neurological: She is alert and oriented to person, place, and time. No sensory deficit.  Skin: Skin is warm, dry and intact. No rash noted. No cyanosis or erythema. Nails show no clubbing.  Psychiatric: She has a normal mood and affect. Her speech is normal and behavior is normal.       Assessment & Plan:    Problem List Items Addressed This Visit    HTN (hypertension) - Primary    Controlled. Update labs. Continue current treatment.      Relevant Medications   lisinopril-hydrochlorothiazide (PRINZIDE,ZESTORETIC) 20-12.5 MG tablet   Other Relevant Orders   Comprehensive metabolic panel   CBC with Differential/Platelet   TSH   T4, free   Hyperglycemia    No symptoms of diabetes, but continue to monitor. Await lab results.      Relevant Orders   Hemoglobin A1c   Hyperlipidemia LDL goal <100    Tolerating atorvastatin. Await lab results.      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 Continue current treatment.      Relevant Medications    sertraline (ZOLOFT) 50 MG tablet   lisinopril-hydrochlorothiazide (PRINZIDE,ZESTORETIC) 20-12.5 MG tablet   Vitamin D insufficiency    Await lab results. Increase vitamin D supplementation if needed.      Relevant Orders   VITAMIN D 25 Hydroxy (Vit-D Deficiency, Fractures)    Other Visit Diagnoses    Need for Tdap vaccination       Relevant Orders   Tdap vaccine greater than or equal to 7yo IM (Completed)   Care order/instruction:     Return in about 6 months (around 04/22/2017) for re-evaluation of blood pressure, cholesterol.  Fernande Brashelle S. Shahzain Kiester, PA-C Certified Physician Assistant [facility]

## 2016-10-20 NOTE — Progress Notes (Deleted)
     Patient ID: Shawna Waters, female    DOB: 1956/07/03, 61 y.o.   MRN: [id number]  PCP: Porfirio Oarhelle TRUE Shackleford, PA-C  No chief complaint on file.   Subjective:   Presents for evaluation of ***.  Colonoscopy? Tdap?  ***.    Review of Systems     Patient Active Problem List   Diagnosis Date Noted  . 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 Acute ischemic stroke (HCC) 10/03/2014  . Hyperlipidemia LDL goal <100 10/03/2014  . TIA (transient ischemic attack) 09/27/2014  . HTN (hypertension) 09/27/2014  . Hyperglycemia   . BMI 37.0-37.9, adult      Prior to Admission medications   Medication Sig Start Date End Date Taking? Authorizing Provider  atorvastatin (LIPITOR) 20 MG tablet TAKE 1 TABLET (20 MG TOTAL) BY MOUTH DAILY AT 6 PM. 10/19/16   Ily Denno, PA-C  Cholecalciferol (VITAMIN D) 2000 UNITS tablet Take 2,000 Units by mouth daily.    Historical Provider, MD  clopidogrel (PLAVIX) 75 MG tablet TAKE 1 TABLET (75 MG TOTAL) BY MOUTH DAILY. 10/19/16   Daquann Merriott, PA-C  lisinopril-hydrochlorothiazide (PRINZIDE,ZESTORETIC) 20-12.5 MG tablet Take 0.5 tablets by mouth daily. 07/23/16   Kaiulani Sitton, PA-C  sertraline (ZOLOFT) 50 MG tablet Take 1 tablet (50 mg total) by mouth daily. 07/23/16   Porfirio Oarhelle Ashley Bultema, PA-C     Allergies  Allergen Reactions  . Codeine Other (See Comments)    Husband & patient unaware of reaction.        Objective:  Physical Exam         Assessment & Plan:   ***

## 2016-10-21 LAB — COMPREHENSIVE METABOLIC PANEL
A/G RATIO: 1.5 (ref 1.2–2.2)
ALT: 17 IU/L (ref 0–32)
AST: 22 IU/L (ref 0–40)
Albumin: 4.3 g/dL (ref 3.5–5.5)
Alkaline Phosphatase: 94 IU/L (ref 39–117)
BUN/Creatinine Ratio: 17 (ref 9–23)
BUN: 15 mg/dL (ref 6–24)
Bilirubin Total: 0.4 mg/dL (ref 0.0–1.2)
CALCIUM: 9.6 mg/dL (ref 8.7–10.2)
CO2: 25 mmol/L (ref 18–29)
Chloride: 102 mmol/L (ref 96–106)
Creatinine, Ser: 0.9 mg/dL (ref 0.57–1.00)
GFR, EST AFRICAN AMERICAN: 81 mL/min/{1.73_m2} (ref >59)
GFR, EST NON AFRICAN AMERICAN: 70 mL/min/{1.73_m2} (ref >59)
Globulin, Total: 2.8 g/dL (ref 1.5–4.5)
Glucose: 91 mg/dL (ref 65–99)
POTASSIUM: 4.5 mmol/L (ref 3.5–5.2)
SODIUM: 142 mmol/L (ref 134–144)
TOTAL PROTEIN: 7.1 g/dL (ref 6.0–8.5)

## 2016-10-21 LAB — CBC WITH DIFFERENTIAL/PLATELET
BASOS: 1 %
Basophils Absolute: 0 10*3/uL (ref 0.0–0.2)
EOS (ABSOLUTE): 0.1 10*3/uL (ref 0.0–0.4)
Eos: 2 %
Hematocrit: 39.7 % (ref 34.0–46.6)
Hemoglobin: 12.9 g/dL (ref 11.1–15.9)
IMMATURE GRANULOCYTES: 0 %
Immature Grans (Abs): 0 10*3/uL (ref 0.0–0.1)
Lymphocytes Absolute: 1.1 10*3/uL (ref 0.7–3.1)
Lymphs: 23 %
MCH: 31 pg (ref 26.6–33.0)
MCHC: 32.5 g/dL (ref 31.5–35.7)
MCV: 95 fL (ref 79–97)
MONOS ABS: 0.4 10*3/uL (ref 0.1–0.9)
Monocytes: 8 %
NEUTROS PCT: 66 %
Neutrophils Absolute: 3.2 10*3/uL (ref 1.4–7.0)
PLATELETS: 285 10*3/uL (ref 150–379)
RBC: 4.16 x10E6/uL (ref 3.77–5.28)
RDW: 13.4 % (ref 12.3–15.4)
WBC: 4.8 10*3/uL (ref 3.4–10.8)

## 2016-10-21 LAB — LIPID PANEL
CHOLESTEROL TOTAL: 168 mg/dL (ref 100–199)
Chol/HDL Ratio: 3.7 ratio units (ref 0.0–4.4)
HDL: 45 mg/dL (ref >39)
LDL CALC: 101 mg/dL — AB (ref 0–99)
TRIGLYCERIDES: 110 mg/dL (ref 0–149)
VLDL Cholesterol Cal: 22 mg/dL (ref 5–40)

## 2016-10-21 LAB — VITAMIN D 25 HYDROXY (VIT D DEFICIENCY, FRACTURES): VIT D 25 HYDROXY: 36.5 ng/mL (ref 30.0–100.0)

## 2016-10-21 LAB — T4, FREE: Free T4: 0.97 ng/dL (ref 0.82–1.77)

## 2016-10-21 LAB — TSH: TSH: 3.65 u[IU]/mL (ref 0.450–4.500)

## 2016-10-21 LAB — HEMOGLOBIN A1C
Est. average glucose Bld gHb Est-mCnc: 111 mg/dL
Hgb A1c MFr Bld: 5.5 % (ref 4.8–5.6)

## 2016-10-23 ENCOUNTER — Ambulatory Visit (INDEPENDENT_AMBULATORY_CARE_PROVIDER_SITE_OTHER): Payer: BC Managed Care – PPO | Admitting: *Deleted

## 2016-10-23 DIAGNOSIS — I638 Other cerebral infarction: Secondary | ICD-10-CM | POA: Diagnosis not present

## 2016-10-23 DIAGNOSIS — I6389 Other cerebral infarction: Secondary | ICD-10-CM

## 2016-10-26 NOTE — Progress Notes (Signed)
Carelink Summary Report / Loop Recorder 

## 2016-10-27 ENCOUNTER — Encounter: Payer: Self-pay | Admitting: Physician Assistant

## 2016-11-05 LAB — CUP PACEART REMOTE DEVICE CHECK
Implantable Pulse Generator Implant Date: 20160310
MDC IDC SESS DTM: 20180331040841

## 2016-11-11 NOTE — Progress Notes (Signed)
Orders only encounter

## 2016-11-23 ENCOUNTER — Ambulatory Visit (INDEPENDENT_AMBULATORY_CARE_PROVIDER_SITE_OTHER): Payer: BC Managed Care – PPO | Admitting: *Deleted

## 2016-11-23 DIAGNOSIS — I638 Other cerebral infarction: Secondary | ICD-10-CM

## 2016-11-23 DIAGNOSIS — I6389 Other cerebral infarction: Secondary | ICD-10-CM

## 2016-11-23 NOTE — Progress Notes (Signed)
Carelink Summary Report / Loop Recorder 

## 2016-12-06 LAB — CUP PACEART REMOTE DEVICE CHECK
Date Time Interrogation Session: 20180430043603
Implantable Pulse Generator Implant Date: 20160310

## 2016-12-06 NOTE — Progress Notes (Signed)
Carelink summary report received. Battery status OK. Normal device function. No new symptom episodes, tachy episodes, brady, or pause episodes. No new AF episodes. Monthly summary reports and ROV/PRN 

## 2016-12-23 ENCOUNTER — Ambulatory Visit (INDEPENDENT_AMBULATORY_CARE_PROVIDER_SITE_OTHER): Payer: BC Managed Care – PPO | Admitting: *Deleted

## 2016-12-23 DIAGNOSIS — I638 Other cerebral infarction: Secondary | ICD-10-CM

## 2016-12-23 DIAGNOSIS — I6389 Other cerebral infarction: Secondary | ICD-10-CM

## 2016-12-24 NOTE — Progress Notes (Signed)
Carelink Summary Report 

## 2016-12-25 LAB — CUP PACEART REMOTE DEVICE CHECK
Date Time Interrogation Session: 20180530043637
Implantable Pulse Generator Implant Date: 20160310

## 2017-01-05 ENCOUNTER — Other Ambulatory Visit: Payer: Self-pay | Admitting: Physician Assistant

## 2017-01-05 DIAGNOSIS — Z1231 Encounter for screening mammogram for malignant neoplasm of breast: Secondary | ICD-10-CM

## 2017-01-18 ENCOUNTER — Ambulatory Visit
Admission: RE | Admit: 2017-01-18 | Discharge: 2017-01-18 | Disposition: A | Discharge status: Home / Self Care | Payer: BC Managed Care – PPO | Source: Ambulatory Visit | Attending: Physician Assistant | Admitting: Physician Assistant

## 2017-01-18 DIAGNOSIS — Z1231 Encounter for screening mammogram for malignant neoplasm of breast: Secondary | ICD-10-CM

## 2017-01-22 ENCOUNTER — Ambulatory Visit (INDEPENDENT_AMBULATORY_CARE_PROVIDER_SITE_OTHER): Payer: BC Managed Care – PPO | Admitting: *Deleted

## 2017-01-22 DIAGNOSIS — I6389 Other cerebral infarction: Secondary | ICD-10-CM

## 2017-01-22 DIAGNOSIS — I638 Other cerebral infarction: Secondary | ICD-10-CM

## 2017-01-22 NOTE — Progress Notes (Signed)
Carelink Summary Report / Loop Recorder 

## 2017-02-01 LAB — CUP PACEART REMOTE DEVICE CHECK
Implantable Pulse Generator Implant Date: 20160310
MDC IDC SESS DTM: 20180629053951

## 2017-02-22 ENCOUNTER — Ambulatory Visit (INDEPENDENT_AMBULATORY_CARE_PROVIDER_SITE_OTHER): Payer: BC Managed Care – PPO | Admitting: *Deleted

## 2017-02-22 DIAGNOSIS — I638 Other cerebral infarction: Secondary | ICD-10-CM | POA: Diagnosis not present

## 2017-02-22 DIAGNOSIS — I6389 Other cerebral infarction: Secondary | ICD-10-CM

## 2017-02-23 NOTE — Progress Notes (Signed)
Carelink Summary Report / Loop Recorder 

## 2017-03-06 LAB — CUP PACEART REMOTE DEVICE CHECK
Implantable Pulse Generator Implant Date: 20160310
MDC IDC SESS DTM: 20180729123911

## 2017-03-23 ENCOUNTER — Ambulatory Visit (INDEPENDENT_AMBULATORY_CARE_PROVIDER_SITE_OTHER): Payer: BC Managed Care – PPO | Admitting: *Deleted

## 2017-03-23 DIAGNOSIS — I6389 Other cerebral infarction: Secondary | ICD-10-CM

## 2017-03-23 DIAGNOSIS — I638 Other cerebral infarction: Secondary | ICD-10-CM

## 2017-03-23 NOTE — Progress Notes (Signed)
Carelink Summary Report / Loop Recorder 

## 2017-03-25 LAB — CUP PACEART REMOTE DEVICE CHECK
Implantable Pulse Generator Implant Date: 20160310
MDC IDC SESS DTM: 20180828165142

## 2017-04-15 ENCOUNTER — Other Ambulatory Visit: Payer: Self-pay | Admitting: Physician Assistant

## 2017-04-19 ENCOUNTER — Telehealth: Payer: Self-pay | Admitting: Physician Assistant

## 2017-04-19 ENCOUNTER — Other Ambulatory Visit: Payer: Self-pay | Admitting: *Deleted

## 2017-04-19 DIAGNOSIS — E785 Hyperlipidemia, unspecified: Secondary | ICD-10-CM

## 2017-04-19 MED ORDER — CLOPIDOGREL BISULFATE 75 MG PO TABS: 75.0000 mg | ORAL_TABLET | Freq: Every day | ORAL | 0 refills | Status: DC

## 2017-04-19 NOTE — Telephone Encounter (Signed)
Pt is needing a refill on clopidodrel and she has an appt on 04/27/17  Best number [phone]

## 2017-04-20 ENCOUNTER — Other Ambulatory Visit: Payer: Self-pay | Admitting: *Deleted

## 2017-04-20 DIAGNOSIS — E785 Hyperlipidemia, unspecified: Secondary | ICD-10-CM

## 2017-04-20 DIAGNOSIS — I10 Essential (primary) hypertension: Secondary | ICD-10-CM

## 2017-04-20 MED ORDER — CLOPIDOGREL BISULFATE 75 MG PO TABS: 75.0000 mg | ORAL_TABLET | Freq: Every day | ORAL | 0 refills | Status: DC

## 2017-04-20 NOTE — Telephone Encounter (Signed)
Pt called to check on the status of her refill.  I advised pt that medication was sent to [facility] yesterday but it is the incorrect pharmacy.  [facility] uses the [facility] (not the [address] on [address] and [address].  Please resend to correct pharmacy

## 2017-04-22 ENCOUNTER — Ambulatory Visit (INDEPENDENT_AMBULATORY_CARE_PROVIDER_SITE_OTHER): Payer: BC Managed Care – PPO | Admitting: *Deleted

## 2017-04-22 DIAGNOSIS — I638 Other cerebral infarction: Secondary | ICD-10-CM | POA: Diagnosis not present

## 2017-04-22 DIAGNOSIS — I6389 Other cerebral infarction: Secondary | ICD-10-CM

## 2017-04-23 LAB — CUP PACEART REMOTE DEVICE CHECK
Implantable Pulse Generator Implant Date: 20160310
MDC IDC SESS DTM: 20180927164009

## 2017-04-23 NOTE — Progress Notes (Signed)
Carelink Summary Report / Loop Recorder 

## 2017-04-27 ENCOUNTER — Encounter: Payer: Self-pay | Admitting: Physician Assistant

## 2017-04-27 ENCOUNTER — Ambulatory Visit (INDEPENDENT_AMBULATORY_CARE_PROVIDER_SITE_OTHER): Payer: BC Managed Care – PPO | Admitting: Physician Assistant

## 2017-04-27 VITALS — BP 126/70 | HR 73 | Temp 98.3°F | Resp 18 | Ht 61.0 in | Wt 212.8 lb

## 2017-04-27 DIAGNOSIS — E559 Vitamin D deficiency, unspecified: Secondary | ICD-10-CM | POA: Diagnosis not present

## 2017-04-27 DIAGNOSIS — E785 Hyperlipidemia, unspecified: Secondary | ICD-10-CM | POA: Diagnosis not present

## 2017-04-27 DIAGNOSIS — I10 Essential (primary) hypertension: Secondary | ICD-10-CM | POA: Diagnosis not present

## 2017-04-27 DIAGNOSIS — Z8673 Personal history of transient ischemic attack (TIA), and cerebral infarction without residual deficits: Secondary | ICD-10-CM | POA: Diagnosis not present

## 2017-04-27 DIAGNOSIS — R739 Hyperglycemia, unspecified: Secondary | ICD-10-CM | POA: Diagnosis not present

## 2017-04-27 DIAGNOSIS — Z23 Encounter for immunization: Secondary | ICD-10-CM | POA: Diagnosis not present

## 2017-04-27 DIAGNOSIS — F3289 Other specified depressive episodes: Secondary | ICD-10-CM | POA: Diagnosis not present

## 2017-04-27 DIAGNOSIS — Z1211 Encounter for screening for malignant neoplasm of colon: Secondary | ICD-10-CM | POA: Diagnosis not present

## 2017-04-27 DIAGNOSIS — G459 Transient cerebral ischemic attack, unspecified: Secondary | ICD-10-CM

## 2017-04-27 MED ORDER — CLOPIDOGREL BISULFATE 75 MG PO TABS: 75.0000 mg | ORAL_TABLET | Freq: Every day | ORAL | 3 refills | Status: AC

## 2017-04-27 MED ORDER — SERTRALINE HCL 100 MG PO TABS: 100.0000 mg | ORAL_TABLET | Freq: Every day | ORAL | 3 refills | Status: AC

## 2017-04-27 MED ORDER — ATORVASTATIN CALCIUM 20 MG PO TABS: ORAL_TABLET | ORAL | 1 refills | Status: DC

## 2017-04-27 MED ORDER — LISINOPRIL-HYDROCHLOROTHIAZIDE 20-12.5 MG PO TABS: 0.5000 | ORAL_TABLET | Freq: Every day | ORAL | 3 refills | Status: DC

## 2017-04-27 NOTE — Patient Instructions (Addendum)
Please schedule peripheral vision exam with Dr. London Sheer at your earliest convenience.   Try Claritin or Zyrtec for the postnasal drainage and cough, if it does not improve or progresses, please call the office.  You can INCREASE the sertraline to 100 mg (I've sent a new prescription), but if you feel better on the 50 mg dose, break the 100 mg in half.   IF you received an x-ray today, you will receive an invoice from [facility]. Please contact [facility] at [phone] with questions or concerns regarding your invoice.   IF you received labwork today, you will receive an invoice from [place]. Please contact [facility] at [phone] with questions or concerns regarding your invoice.   Our billing staff will not be able to assist you with questions regarding bills from these companies.  You will be contacted with the lab results as soon as they are available. The fastest way to get your results is to activate your My Chart account. Instructions are located on the last [place] of this paperwork. If you have not heard from [place] regarding the results in 2 weeks, please contact this office.

## 2017-04-27 NOTE — Progress Notes (Signed)
Patient ID: Shawna Waters, female    DOB: 05/07/1956, 61 y.o.   MRN: [id number]  PCP: Shawna Oar, PA-C  Chief Complaint  Patient presents with  . Hypertension  . Hyperlipidemia  . Follow-up    6 month follow up, Pt would like to discuss flu vaccine.  . Medication Refill    Atorvastatin 20 MG    Subjective:   Presents for evaluation of HTN, lipids, hyperglycemia.  She's taken a new job, starting tomorrow, so she'd like to wait to receive the flu vaccine until the end of the week.  Tolerates her medications. No adverse effects. No CP, SOB, HA, dizziness.  Has had near resolution of speech deficits follow a stroke.    Review of Systems As above    Patient Active Problem List   Diagnosis Date Noted  . Vitamin D insufficiency 10/20/2016  . Depression due to stroke 02/04/2015  . 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 Acute ischemic stroke (HCC) 10/03/2014  . Hyperlipidemia LDL goal <100 10/03/2014  . TIA (transient ischemic attack) 09/27/2014  . HTN (hypertension) 09/27/2014  . Hyperglycemia   . BMI 37.0-37.9, adult      Prior to Admission medications   Medication Sig Start Date End Date Taking? Authorizing Provider  atorvastatin (LIPITOR) 20 MG tablet TAKE 1 TABLET (20 MG TOTAL) BY MOUTH DAILY AT 6 PM. 10/19/16  Yes Griselle Rufer, PA-C  Cholecalciferol (VITAMIN D) 2000 UNITS tablet Take 2,000 Units by mouth daily.   Yes [provider]  clopidogrel (PLAVIX) 75 MG tablet Take 1 tablet (75 mg total) by mouth daily. 04/20/17  Yes Zawadi Aplin, PA-C  lisinopril-hydrochlorothiazide (PRINZIDE,ZESTORETIC) 20-12.5 MG tablet Take 0.5 tablets by mouth daily. 10/20/16  Yes Vonya Ohalloran, PA-C  sertraline (ZOLOFT) 50 MG tablet Take 1 tablet (50 mg total) by mouth daily. 10/20/16  Yes Shawna Oar, PA-C     Allergies  Allergen Reactions  . Codeine Other (See Comments)    Husband & patient unaware of reaction.          Objective:  Physical Exam  Constitutional: She is oriented to person, place, and time. She appears well-developed and well-nourished. She is active and cooperative. No distress.  BP 126/70 (BP Location: Right Arm, Patient Position: Sitting, Cuff Size: Large)   Pulse 73   Temp 98.3 F (36.8 C) (Oral)   Resp 18   Ht  (1.549 m)   Wt 212 lb 12.8 oz (96.5 kg)   SpO2 98%   BMI 40.21 kg/m   HENT:  Head: Normocephalic and atraumatic.  Right Ear: Hearing normal.  Left Ear: Hearing normal.  Eyes: Conjunctivae are normal. No scleral icterus.  Neck: Normal range of motion. Neck supple. No thyromegaly present.  Cardiovascular: Normal rate, regular rhythm and normal heart sounds.   Pulses:      Radial pulses are 2+ on the right side, and 2+ on the left side.  Pulmonary/Chest: Effort normal and breath sounds normal.  Lymphadenopathy:       Head (right side): No tonsillar, no preauricular, no posterior auricular and no occipital adenopathy present.       Head (left side): No tonsillar, no preauricular, no posterior auricular and no occipital adenopathy present.    She has no cervical adenopathy.       Right: No supraclavicular adenopathy present.       Left: No supraclavicular adenopathy present.  Neurological: She is alert and oriented to person,  place, and time. No sensory deficit.  Skin: Skin is warm, dry and intact. No rash noted. No cyanosis or erythema. Nails show no clubbing.  Psychiatric: Her speech is normal and behavior is normal. Judgment and thought content normal. Her mood appears not anxious. Her affect is labile (tearful). Her affect is not angry, not blunt and not inappropriate. Cognition and memory are normal. She does not exhibit a depressed mood.           Assessment & Plan:   Problem List Items Addressed This Visit    HTN (hypertension) - Primary    Controlled. Continue current treatment.      Relevant Medications   atorvastatin (LIPITOR) 20 MG tablet    lisinopril-hydrochlorothiazide (PRINZIDE,ZESTORETIC) 20-12.5 MG tablet   Other Relevant Orders   CBC with Differential/Platelet (Completed)   Comprehensive metabolic panel (Completed)   TSH (Completed)   Hyperglycemia    Await labs. Adjust regimen as indicated by results.      Relevant Orders   Hemoglobin A1c (Completed)   History of TIA (transient ischemic attack) and stroke    Continue management of blood pressure, lipids and glucose to reduce risk of recurrence.      Hyperlipidemia LDL goal <100    Await labs. Adjust regimen as indicated by results.      Relevant Medications   atorvastatin (LIPITOR) 20 MG tablet   lisinopril-hydrochlorothiazide (PRINZIDE,ZESTORETIC) 20-12.5 MG tablet   Other Relevant Orders   Comprehensive metabolic panel (Completed)   Lipid panel (Completed)   Depression    Not sure she wants to increase the sertraline dose, but is experiencing symptoms. New prescription for the 100 mg tablet. She can increase to that dose, or break it in half to continue the current dose.      Relevant Medications   sertraline (ZOLOFT) 100 MG tablet   Vitamin D insufficiency    Await labs. Adjust regimen as indicated by results.      Relevant Orders   Vitamin D 1,25 dihydroxy (Completed)   RESOLVED: TIA (transient ischemic attack)    Continue management of blood pressure, lipids and glucose to reduce risk of recurrence.      Relevant Medications   atorvastatin (LIPITOR) 20 MG tablet   lisinopril-hydrochlorothiazide (PRINZIDE,ZESTORETIC) 20-12.5 MG tablet   clopidogrel (PLAVIX) 75 MG tablet    Other Visit Diagnoses    Screening for colon cancer       Relevant Orders   Ambulatory referral to Gastroenterology   Flu vaccine need       she will obtain this later this week       Return in about 6 months (around 10/26/2017) for re-evaluation of cholesterol and blood pressure.   Fernande Bras, PA-C Primary Care at [facility]

## 2017-04-27 NOTE — Progress Notes (Signed)
Subjective:    Patient ID: Shawna Waters, female    DOB: 1956-05-04, 61 y.o.   MRN: [id number]  HPI  Chief Complaint  Patient presents with  . Hypertension  . Hyperlipidemia  . Follow-up    6 month follow up, Pt would like to discuss flu vaccine.  . Medication Refill    Atorvastatin 20 MG   Patient returns today for re-evaluation of HTN, HLD, and medication refill.  Compliant with medications, taking them daily. She states she is tolerating her medications well. Denies any cough or muscle pain.  Checks BP once every 2 or 3 weeks.  Patient continues to experience some stuttering and expressive aphasia when she is stressed or anxious. Occasional tingling in the right side of head.  "Memory is not great, but is tolerable."  Notes about 2 weeks ago, started having some post nasal drainage and cough. Thinks it is due to seasonal allergies, but has not tried any OTC products. It has stayed about the same for the last 2 weeks.  Taking care of her parents causes stress, but "everything is going to be okay."   She reports daily BM.  She notes that she used to walk about 2-3 miles a day, but due to the unpredictable weather she has not been able to walk as much as she like. She will try to go 2-3x a week. Feels that she is more tired because she is not walking as much.  Reports she keeps a low sodium diet, and avoids eating out often. Breakfast: Cereal with milk, Banana Lunch: Protein bar Dinner: Chicken, Pork chop, Sandwich, Cereal  Stays hydrated with water, 80-100 oz.   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   Last eye exam: 11/2016, reports the optometrist London Sheer) would like to test her peripheral vision. Last dental exam: 1 year ago Patient is willing to schedule a colonoscopy but "needs to find the right time to schedule it."  Wt Readings from Last 3 Encounters:  04/27/17 212 lb 12.8 oz  (96.5 kg)  10/20/16 203 lb (92.1 kg)  04/27/16 204 lb 9.6 oz (92.8 kg)   Review of Systems  Constitutional: Positive for fatigue. Negative for chills, diaphoresis and fever.  HENT: Positive for postnasal drip, sinus pressure, sore throat and trouble swallowing. Negative for congestion, ear pain, hearing loss, rhinorrhea, sinus pain and tinnitus.   Eyes: Negative for visual disturbance.  Respiratory: Positive for cough. Negative for choking, chest tightness and shortness of breath.   Cardiovascular: Positive for palpitations. Negative for chest pain and leg swelling.  Gastrointestinal: Negative for abdominal pain, blood in stool, constipation, diarrhea, nausea and vomiting.  Genitourinary: Negative.   Musculoskeletal: Negative for arthralgias and joint swelling.  Neurological: Positive for speech difficulty. Negative for dizziness, syncope and headaches.  Psychiatric/Behavioral: Positive for sleep disturbance ("since menopause").   Patient Active Problem List   Diagnosis Date Noted  . Vitamin D insufficiency 10/20/2016  . Depression due to stroke 02/04/2015  . 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 Acute ischemic stroke (HCC) 10/03/2014  . Hyperlipidemia LDL goal <100 10/03/2014  . TIA (transient ischemic attack) 09/27/2014  . HTN (hypertension) 09/27/2014  . Hyperglycemia   . BMI 37.0-37.9, adult    Prior to Admission medications   Medication Sig Start Date End Date Taking? Authorizing Provider  atorvastatin (LIPITOR) 20 MG tablet TAKE 1 TABLET (20 MG  TOTAL) BY MOUTH DAILY AT 6 PM. 10/19/16  Yes Jeffery, Chelle, PA-C  Cholecalciferol (VITAMIN D) 2000 UNITS tablet Take 2,000 Units by mouth daily.   Yes [provider]  clopidogrel (PLAVIX) 75 MG tablet Take 1 tablet (75 mg total) by mouth daily. 04/20/17  Yes Jeffery, Chelle, PA-C  lisinopril-hydrochlorothiazide (PRINZIDE,ZESTORETIC) 20-12.5 MG tablet Take 0.5 tablets by mouth daily. 10/20/16  Yes  Jeffery, Chelle, PA-C  sertraline (ZOLOFT) 50 MG tablet Take 1 tablet (50 mg total) by mouth daily. 10/20/16  Yes Porfirio Oar, PA-C   Allergies  Allergen Reactions  . Codeine Other (See Comments)    Husband & patient unaware of reaction.    Social History   Social History  . Marital status: Married    Spouse name: RIchard N. Godinho  . Number of children: 1  . Years of education: College   Occupational History  . 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 Tutoring K2 students, [employer]    Social History Main Topics  . Smoking status: Never Smoker  . Smokeless tobacco: Never Used  . Alcohol use No  . Drug use: No  . Sexual activity: Yes    Partners: Male   Other Topics Concern  . Not on file   Social History Narrative   Lives at home with husband. Adult son lives with them.   Takes care of her 87 year old parents   Right handed.   Caffeine use: none      Objective:   Physical Exam  Constitutional: She is oriented to person, place, and time. She appears well-developed and well-nourished. No distress.  HENT:  Head: Normocephalic and atraumatic.  Right Ear: External ear normal.  Left Ear: External ear normal.  Neck: Neck supple. No JVD present. No tracheal deviation present. No thyromegaly present.  Cardiovascular: Normal heart sounds and intact distal pulses.  Exam reveals no gallop and no friction rub.   No murmur heard. Pulmonary/Chest: Effort normal. No stridor. No respiratory distress. She has no wheezes. She has rhonchi in the right upper field. She has no rales. She exhibits no tenderness.  Abdominal: Soft. Bowel sounds are normal. She exhibits no distension and no mass. There is no tenderness. There is no rebound and no guarding.  Lymphadenopathy:    She has no cervical adenopathy.  Neurological: She is alert and oriented to person, place, and time. She has normal reflexes.  Skin: Skin is warm and dry. No rash noted. She is not diaphoretic. No  erythema. No pallor.  Psychiatric: She has a normal mood and affect. Her behavior is normal. Judgment and thought content normal.      Assessment & Plan:  1. Essential hypertension - Controlled. Continue on current treatment.  - CBC with Differential/Platelet - Comprehensive metabolic panel - TSH - lisinopril-hydrochlorothiazide (PRINZIDE,ZESTORETIC) 20-12.5 MG tablet; Take 0.5 tablets by mouth daily.  Dispense: 45 tablet; Refill: 3  2. Hyperlipidemia LDL goal <100 - Controlled. Continue on current treatment.  - Comprehensive metabolic panel - Lipid panel - atorvastatin (LIPITOR) 20 MG tablet; TAKE 1 TABLET (20 MG TOTAL) BY MOUTH DAILY AT 6 PM.  Dispense: 90 tablet; Refill: 1  3. Vitamin D insufficiency - Vitamin D 1,25 dihydroxy  4. Screening for colon cancer - Ambulatory referral to Gastroenterology  5. Hyperglycemia - Hemoglobin A1c  6. Flu vaccine need - Will obtain on a Friday.   7. TIA (transient ischemic attack) - clopidogrel (PLAVIX) 75 MG tablet; Take 1 tablet (75  mg total) by mouth daily.  Dispense: 90 tablet; Refill: 3  8. Other depression - sertraline (ZOLOFT) 100 MG tablet; Take 1 tablet (100 mg total) by mouth daily.  Dispense: 90 tablet; Refill: 3  Respectfully, Gala Romney PA-S 2019

## 2017-04-30 LAB — CBC WITH DIFFERENTIAL/PLATELET
Basophils Absolute: 0 10*3/uL (ref 0.0–0.2)
Basos: 0 %
EOS (ABSOLUTE): 0.1 10*3/uL (ref 0.0–0.4)
EOS: 2 %
HEMATOCRIT: 38.5 % (ref 34.0–46.6)
HEMOGLOBIN: 12.7 g/dL (ref 11.1–15.9)
IMMATURE GRANS (ABS): 0 10*3/uL (ref 0.0–0.1)
IMMATURE GRANULOCYTES: 0 %
LYMPHS ABS: 1.2 10*3/uL (ref 0.7–3.1)
Lymphs: 22 %
MCH: 31.5 pg (ref 26.6–33.0)
MCHC: 33 g/dL (ref 31.5–35.7)
MCV: 96 fL (ref 79–97)
MONOCYTES: 8 %
MONOS ABS: 0.4 10*3/uL (ref 0.1–0.9)
NEUTROS ABS: 3.5 10*3/uL (ref 1.4–7.0)
Neutrophils: 68 %
PLATELETS: 290 10*3/uL (ref 150–379)
RBC: 4.03 x10E6/uL (ref 3.77–5.28)
RDW: 13.2 % (ref 12.3–15.4)
WBC: 5.2 10*3/uL (ref 3.4–10.8)

## 2017-04-30 LAB — LIPID PANEL
CHOLESTEROL TOTAL: 143 mg/dL (ref 100–199)
Chol/HDL Ratio: 3.3 ratio (ref 0.0–4.4)
HDL: 44 mg/dL (ref >39)
LDL CALC: 74 mg/dL (ref 0–99)
Triglycerides: 124 mg/dL (ref 0–149)
VLDL CHOLESTEROL CAL: 25 mg/dL (ref 5–40)

## 2017-04-30 LAB — COMPREHENSIVE METABOLIC PANEL
A/G RATIO: 1.5 (ref 1.2–2.2)
ALBUMIN: 4.2 g/dL (ref 3.5–5.5)
ALT: 14 IU/L (ref 0–32)
AST: 20 IU/L (ref 0–40)
Alkaline Phosphatase: 98 IU/L (ref 39–117)
BUN / CREAT RATIO: 14 (ref 9–23)
BUN: 12 mg/dL (ref 6–24)
Bilirubin Total: 0.4 mg/dL (ref 0.0–1.2)
CALCIUM: 9.8 mg/dL (ref 8.7–10.2)
CO2: 24 mmol/L (ref 20–29)
Chloride: 104 mmol/L (ref 96–106)
Creatinine, Ser: 0.87 mg/dL (ref 0.57–1.00)
GFR, EST AFRICAN AMERICAN: 84 mL/min/{1.73_m2} (ref >59)
GFR, EST NON AFRICAN AMERICAN: 73 mL/min/{1.73_m2} (ref >59)
Globulin, Total: 2.8 g/dL (ref 1.5–4.5)
Glucose: 93 mg/dL (ref 65–99)
POTASSIUM: 4.6 mmol/L (ref 3.5–5.2)
Sodium: 141 mmol/L (ref 134–144)
TOTAL PROTEIN: 7 g/dL (ref 6.0–8.5)

## 2017-04-30 LAB — HEMOGLOBIN A1C
Est. average glucose Bld gHb Est-mCnc: 111 mg/dL
HEMOGLOBIN A1C: 5.5 % (ref 4.8–5.6)

## 2017-04-30 LAB — TSH: TSH: 2.34 u[IU]/mL (ref 0.450–4.500)

## 2017-04-30 LAB — VITAMIN D 1,25 DIHYDROXY
VITAMIN D3 1, 25 (OH): 28 pg/mL
Vitamin D 1, 25 (OH)2 Total: 28 pg/mL
Vitamin D2 1, 25 (OH)2: <10 pg/mL

## 2017-05-03 ENCOUNTER — Encounter: Payer: Self-pay | Admitting: Physician Assistant

## 2017-05-03 NOTE — Assessment & Plan Note (Signed)
Continue management of blood pressure, lipids and glucose to reduce risk of recurrence. 

## 2017-05-03 NOTE — Assessment & Plan Note (Signed)
Not sure she wants to increase the sertraline dose, but is experiencing symptoms. New prescription for the 100 mg tablet. She can increase to that dose, or break it in half to continue the current dose.

## 2017-05-03 NOTE — Assessment & Plan Note (Signed)
Controlled. Continue current treatment. 

## 2017-05-03 NOTE — Assessment & Plan Note (Signed)
Await labs. Adjust regimen as indicated by results.  

## 2017-05-03 NOTE — Assessment & Plan Note (Signed)
Continue management of blood pressure, lipids and glucose to reduce risk of recurrence.

## 2017-05-24 ENCOUNTER — Ambulatory Visit (INDEPENDENT_AMBULATORY_CARE_PROVIDER_SITE_OTHER): Payer: BC Managed Care – PPO | Admitting: *Deleted

## 2017-05-24 DIAGNOSIS — I6389 Other cerebral infarction: Secondary | ICD-10-CM

## 2017-05-25 NOTE — Progress Notes (Signed)
Carelink Summary Report / Loop Recorder 

## 2017-05-28 ENCOUNTER — Encounter: Payer: Self-pay | Admitting: Physician Assistant

## 2017-05-28 LAB — CUP PACEART REMOTE DEVICE CHECK
Date Time Interrogation Session: 20181027174504
Implantable Pulse Generator Implant Date: 20160310

## 2017-06-21 ENCOUNTER — Ambulatory Visit (INDEPENDENT_AMBULATORY_CARE_PROVIDER_SITE_OTHER): Payer: BC Managed Care – PPO | Admitting: *Deleted

## 2017-06-21 ENCOUNTER — Other Ambulatory Visit: Payer: Self-pay | Admitting: Physician Assistant

## 2017-06-21 DIAGNOSIS — I6389 Other cerebral infarction: Secondary | ICD-10-CM

## 2017-06-22 NOTE — Progress Notes (Signed)
Carelink Summary Report / Loop Recorder 

## 2017-07-06 LAB — CUP PACEART REMOTE DEVICE CHECK
Implantable Pulse Generator Implant Date: 20160310
MDC IDC SESS DTM: 20181126182135

## 2017-07-13 ENCOUNTER — Other Ambulatory Visit: Payer: Self-pay | Admitting: Physician Assistant

## 2017-07-14 NOTE — Telephone Encounter (Signed)
Request for plavix

## 2017-07-21 ENCOUNTER — Ambulatory Visit (INDEPENDENT_AMBULATORY_CARE_PROVIDER_SITE_OTHER): Payer: BC Managed Care – PPO | Admitting: *Deleted

## 2017-07-21 DIAGNOSIS — I6389 Other cerebral infarction: Secondary | ICD-10-CM | POA: Diagnosis not present

## 2017-07-22 NOTE — Progress Notes (Signed)
Carelink Summary Report / Loop Recorder 

## 2017-08-01 LAB — CUP PACEART REMOTE DEVICE CHECK
Date Time Interrogation Session: 20181226173815
Implantable Pulse Generator Implant Date: 20160310

## 2017-08-13 ENCOUNTER — Encounter: Payer: Self-pay | Admitting: Physician Assistant

## 2017-08-13 ENCOUNTER — Ambulatory Visit: Payer: BC Managed Care – PPO | Admitting: Physician Assistant

## 2017-08-13 ENCOUNTER — Other Ambulatory Visit: Payer: Self-pay

## 2017-08-13 VITALS — BP 133/83 | HR 99 | Temp 98.9°F | Resp 16 | Ht 61.5 in | Wt 217.2 lb

## 2017-08-13 DIAGNOSIS — J3489 Other specified disorders of nose and nasal sinuses: Secondary | ICD-10-CM

## 2017-08-13 DIAGNOSIS — J209 Acute bronchitis, unspecified: Secondary | ICD-10-CM

## 2017-08-13 DIAGNOSIS — R059 Cough, unspecified: Secondary | ICD-10-CM

## 2017-08-13 DIAGNOSIS — R05 Cough: Secondary | ICD-10-CM

## 2017-08-13 MED ORDER — GUAIFENESIN ER 600 MG PO TB12: 600.0000 mg | ORAL_TABLET | Freq: Two times a day (BID) | ORAL | 0 refills | Status: DC

## 2017-08-13 MED ORDER — HYDROCODONE-HOMATROPINE 5-1.5 MG/5ML PO SYRP: 5.0000 mL | ORAL_SOLUTION | Freq: Three times a day (TID) | ORAL | 0 refills | Status: DC | PRN

## 2017-08-13 MED ORDER — BENZONATATE 100 MG PO CAPS: 100.0000 mg | ORAL_CAPSULE | Freq: Three times a day (TID) | ORAL | 0 refills | Status: DC | PRN

## 2017-08-13 MED ORDER — AZITHROMYCIN 250 MG PO TABS: ORAL_TABLET | ORAL | 0 refills | Status: DC

## 2017-08-13 NOTE — Patient Instructions (Addendum)
If you do not like Hycodan cough syrup, buy over the counter Delsym.  You may fill Azithromycin (antibiotic) if you are not improving in 2-3 days.  Stay well hydrated. Get lost of rest.  Advil or ibuprofen for pain. Do not take Aspirin.  Throat lozenges (if you are not at risk for choking) or sprays may be used to soothe your throat. Drink enough water and fluids to keep your urine clear or pale yellow. For sore throat: ? Gargle with 8 oz of salt water ( tsp of salt per 1 qt of water) as often as every 1-2 hours to soothe your throat.  Use Elderberry syrup.   For sore throat try using a honey-based tea. Use 3 teaspoons of honey with juice squeezed from half lemon. Place shaved pieces of ginger into 1/2-1 cup of water and warm over stove top. Then mix the ingredients and repeat every 4 hours as needed.  Cough Syrup Recipe: Sweet Lemon & Honey Thyme  Ingredients a handful of fresh thyme sprigs   1 pint of water (2 cups)  1/2 cup honey (raw is best, but regular will do)  1/2 lemon chopped Instructions 1. Place the lemon in the pint jar and cover with the honey. The honey will macerate the lemons and draw out liquids which taste so delicious! 2. Meanwhile, toss the thyme leaves into a saucepan and cover them with the water. 3. Bring the water to a gentle simmer and reduce it to half, about a cup of tea. 4. When the tea is reduced and cooled a bit, strain the sprigs & leaves, add it into the pint jar and stir it well. 5. Give it a shake and use a spoonful as needed. 6. Store your homemade cough syrup in the refrigerator for about a month.  What causes a cough? In adults, common causes of a cough include: ?An infection of the airways or lungs (such as the common cold) ?Postnasal drip - Postnasal drip is when mucus from the nose drips down or flows along the back of the throat. Postnasal drip can happen when people have: .A cold .Allergies .A sinus infection - The sinuses are hollow  areas in the bones of the face that open into the nose. ?Lung conditions, like asthma and chronic obstructive pulmonary disease (COPD) - Both of these conditions can make it hard to breathe. COPD is usually caused by smoking. ?Acid reflux - Acid reflux is when the acid that is normally in your stomach backs up into your esophagus (the tube that carries food from your mouth to your stomach). ?A side effect from blood pressure medicines called "ACE inhibitors" ?Smoking cigarettes  Is there anything I can do on my own to get rid of my cough? Yes. To help get rid of your cough, you can: ?Use a humidifier in your bedroom ?Use an over-the-counter cough medicine, or suck on cough drops or hard candy ?Stop smoking, if you smoke ?If you have allergies, avoid the things you are allergic to (like pollen, dust, animals, or mold) If you have acid reflux, your doctor or nurse will tell you which lifestyle changes can help reduce symptoms.    IF you received an x-ray today, you will receive an invoice [facility]. Please contact [facility] at [phone] with questions or concerns regarding your invoice.   IF you received labwork today, you will receive an invoice from [facility]. Please contact [facility] at [phone] with questions or concerns regarding your invoice.  Our billing staff will not be able to assist you with questions regarding bills from these companies.  You will be contacted with the lab results as soon as they are available. The fastest way to get your results is to activate your My Chart account. Instructions are located on the last [place] of this paperwork. If you have not heard from us regarding the results in 2 weeks, please contact this office.     

## 2017-08-13 NOTE — Progress Notes (Signed)
Shawna Waters  MRN: [id number] DOB: 04-05-1956  PCP: Porfirio OarJeffery, Chelle, PA-C  Subjective:  Pt is a 62- year old female who presents to clinic for cough x 1 week. Worsening symptoms started yesterday. Cough is productive and she endorses night sweats and chills, nasal congestion.  She recently started back tutoring students.   Review of Systems  Constitutional: Positive for chills, diaphoresis and fatigue. Negative for fever.  HENT: Positive for congestion, rhinorrhea and sinus pressure. Negative for postnasal drip, sinus pain and sore throat.   Respiratory: Positive for cough. Negative for shortness of breath and wheezing.   Psychiatric/Behavioral: Positive for sleep disturbance.    Patient Active Problem List   Diagnosis Date Noted  . Vitamin D insufficiency 10/20/2016  . Depression 02/04/2015  . 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 History of TIA (transient ischemic attack) and stroke 10/03/2014  . Hyperlipidemia LDL goal <100 10/03/2014  . HTN (hypertension) 09/27/2014  . Hyperglycemia   . BMI 37.0-37.9, adult     Current Outpatient Medications on File Prior to Visit  Medication Sig Dispense Refill  . atorvastatin (LIPITOR) 20 MG tablet TAKE 1 TABLET (20 MG TOTAL) BY MOUTH DAILY AT 6 PM. 90 tablet 1  . Cholecalciferol (VITAMIN D) 2000 UNITS tablet Take 2,000 Units by mouth daily.    . clopidogrel (PLAVIX) 75 MG tablet Take 1 tablet (75 mg total) by mouth daily. 90 tablet 3  . lisinopril-hydrochlorothiazide (PRINZIDE,ZESTORETIC) 20-12.5 MG tablet Take 0.5 tablets by mouth daily. 45 tablet 3  . sertraline (ZOLOFT) 100 MG tablet Take 1 tablet (100 mg total) by mouth daily. 90 tablet 3   No current facility-administered medications on file prior to visit.     Allergies  Allergen Reactions  . Codeine Other (See Comments)    Husband & patient unaware of reaction.      Objective:  BP 133/83   Pulse 99   Temp 98.9 F (37.2 C) (Oral)   Resp  16   Ht 5' 1.5" (1.562 m)   Wt 217 lb 3.2 oz (98.5 kg)   SpO2 100%   BMI 40.37 kg/m   Physical Exam  Constitutional: She is oriented to person, place, and time and well-developed, well-nourished, and in no distress. No distress.  Cardiovascular: Normal rate, regular rhythm and normal heart sounds.  Pulmonary/Chest: Effort normal. She has no decreased breath sounds. She has no wheezes. She has rhonchi in the right middle field. She has no rales.  Neurological: She is alert and oriented to person, place, and time. GCS score is 15.  Skin: Skin is warm and dry.  Psychiatric: Mood, memory, affect and judgment normal.  Vitals reviewed.   Assessment and Plan :  1. Acute bronchitis, unspecified organism 2. Cough - azithromycin (ZITHROMAX) 250 MG tablet; Take 2 tabs PO x 1 dose, then 1 tab PO QD x 4 days  Dispense: 6 tablet; Refill: 0 - benzonatate (TESSALON) 100 MG capsule; Take 1-2 capsules (100-200 mg total) by mouth 3 (three) times daily as needed for cough.  Dispense: 40 capsule; Refill: 0 - HYDROcodone-homatropine (HYCODAN) 5-1.5 MG/5ML syrup; Take 5 mLs by mouth every 8 (eight) hours as needed for cough.  Dispense: 120 mL; Refill: 0 - pt presents with worsening cough and chills x 1 week. Elevated pulse, rhonchi on PE. Plan to cover with azithromycin. RTC in 1 week if no improvement.  3. Nasal drainage - guaiFENesin (MUCINEX) 600 MG 12 hr tablet; Take 1 tablet (  600 mg total) by mouth 2 (two) times daily.  Dispense: 14 tablet; Refill: 0   Marco Collie, PA-C  Primary Care at [facility] 08/13/2017 5:52 PM

## 2017-08-20 ENCOUNTER — Ambulatory Visit (INDEPENDENT_AMBULATORY_CARE_PROVIDER_SITE_OTHER): Payer: BC Managed Care – PPO | Admitting: *Deleted

## 2017-08-20 DIAGNOSIS — I6389 Other cerebral infarction: Secondary | ICD-10-CM | POA: Diagnosis not present

## 2017-08-20 NOTE — Progress Notes (Signed)
Carelink Summary Report / Loop Recorder 

## 2017-08-27 LAB — CUP PACEART REMOTE DEVICE CHECK
MDC IDC PG IMPLANT DT: 20160310
MDC IDC SESS DTM: 20190125184055

## 2017-09-22 ENCOUNTER — Ambulatory Visit (INDEPENDENT_AMBULATORY_CARE_PROVIDER_SITE_OTHER): Payer: BC Managed Care – PPO | Admitting: *Deleted

## 2017-09-22 DIAGNOSIS — I6389 Other cerebral infarction: Secondary | ICD-10-CM

## 2017-09-23 NOTE — Progress Notes (Signed)
Carelink Summary Report / Loop Recorder 

## 2017-10-22 ENCOUNTER — Other Ambulatory Visit: Payer: Self-pay | Admitting: Physician Assistant

## 2017-10-22 DIAGNOSIS — E785 Hyperlipidemia, unspecified: Secondary | ICD-10-CM

## 2017-10-25 ENCOUNTER — Ambulatory Visit (INDEPENDENT_AMBULATORY_CARE_PROVIDER_SITE_OTHER): Payer: BC Managed Care – PPO | Admitting: *Deleted

## 2017-10-25 DIAGNOSIS — I6389 Other cerebral infarction: Secondary | ICD-10-CM

## 2017-10-26 NOTE — Progress Notes (Signed)
Carelink Summary Reprot / Loop Recorder 

## 2017-10-29 ENCOUNTER — Ambulatory Visit: Payer: BC Managed Care – PPO | Admitting: Physician Assistant

## 2017-10-29 ENCOUNTER — Encounter: Payer: Self-pay | Admitting: Physician Assistant

## 2017-10-29 ENCOUNTER — Other Ambulatory Visit: Payer: Self-pay

## 2017-10-29 VITALS — BP 133/82 | HR 81 | Temp 98.6°F | Resp 17 | Ht 61.5 in | Wt 214.8 lb

## 2017-10-29 DIAGNOSIS — E785 Hyperlipidemia, unspecified: Secondary | ICD-10-CM | POA: Diagnosis not present

## 2017-10-29 DIAGNOSIS — F3289 Other specified depressive episodes: Secondary | ICD-10-CM | POA: Diagnosis not present

## 2017-10-29 DIAGNOSIS — R05 Cough: Secondary | ICD-10-CM

## 2017-10-29 DIAGNOSIS — R739 Hyperglycemia, unspecified: Secondary | ICD-10-CM | POA: Diagnosis not present

## 2017-10-29 DIAGNOSIS — I10 Essential (primary) hypertension: Secondary | ICD-10-CM | POA: Diagnosis not present

## 2017-10-29 DIAGNOSIS — R059 Cough, unspecified: Secondary | ICD-10-CM

## 2017-10-29 DIAGNOSIS — E559 Vitamin D deficiency, unspecified: Secondary | ICD-10-CM | POA: Diagnosis not present

## 2017-10-29 MED ORDER — LOSARTAN POTASSIUM 50 MG PO TABS: 50.0000 mg | ORAL_TABLET | Freq: Every day | ORAL | 3 refills | Status: DC

## 2017-10-29 MED ORDER — ATORVASTATIN CALCIUM 20 MG PO TABS: ORAL_TABLET | ORAL | 3 refills | Status: DC

## 2017-10-29 NOTE — Assessment & Plan Note (Signed)
Controlled, but suspect ACEI cough. STOP lisinoprilHCTZ. START losartan 50 mg daily.

## 2017-10-29 NOTE — Assessment & Plan Note (Signed)
Await lab results. 

## 2017-10-29 NOTE — Progress Notes (Signed)
Subjective:    Patient ID: Shawna FredricksonVickie B Babson, female    DOB: 04-26-1956, 62 y.o.   MRN: [id number]  HPI  Pt reports doing well and having no issues with her medications. Reports a cough that is on and off since January, unassociated with cold symptoms.   Reports having smoothies in the AM (fruits, almond milk) and limiting sodium intake.   Reports a few days of rhinorrhea (began clear and is now yellow), dry cough, and sneezing. Has been taking Nyquil to help sleep at night, but has not helped the symptoms. Reports cough is worse at night and early AM. Denies fever, chills, myalgias.    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  Denies myalgias.  Review of Systems  Constitutional: Positive for fatigue (since starting work again.). Negative for activity change, appetite change, chills, diaphoresis, fever and unexpected weight change.  HENT: Positive for congestion, ear pain (right), postnasal drip, rhinorrhea, sinus pressure, sinus pain, sneezing and tinnitus (right). Negative for sore throat and trouble swallowing.   Eyes: Negative.   Respiratory: Positive for cough. Negative for chest tightness, shortness of breath and wheezing.   Cardiovascular: Negative for chest pain, palpitations and leg swelling.  Gastrointestinal: Negative for abdominal pain, diarrhea and vomiting.  Endocrine: Positive for cold intolerance and heat intolerance.  Genitourinary: Negative.   Musculoskeletal: Negative for arthralgias, back pain and myalgias.  Skin: Negative.   Neurological: Negative for dizziness and headaches.  Hematological: Negative.   Psychiatric/Behavioral: Negative.    Patient Active Problem List   Diagnosis Date Noted  . Vitamin D insufficiency 10/20/2016  . Depression 02/04/2015  . 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 History of TIA  (transient ischemic attack) and stroke 10/03/2014  . Hyperlipidemia LDL goal <100 10/03/2014  . HTN (hypertension) 09/27/2014  . Hyperglycemia   . BMI 37.0-37.9, adult    Past Medical History:  Diagnosis Date  . Encephalitis   . Hypertension   . Stroke (HCC)   . TIA (transient ischemic attack) 09/27/2014   Allergies  Allergen Reactions  . Codeine Other (See Comments)    Husband & patient unaware of reaction.    Prior to Admission medications   Medication Sig Start Date End Date Taking? Authorizing Provider  atorvastatin (LIPITOR) 20 MG tablet TAKE 1 TABLET EVERY DAY AT 6 PM 10/29/17  Yes Jeffery, Chelle, PA-C  Cholecalciferol (VITAMIN D) 2000 UNITS tablet Take 2,000 Units by mouth daily.   Yes [provider]  clopidogrel (PLAVIX) 75 MG tablet Take 1 tablet (75 mg total) by mouth daily. 04/27/17  Yes Jeffery, Chelle, PA-C  losartan (COZAAR) 50 MG tablet Take 1 tablet (50 mg total) by mouth daily. 10/29/17   Porfirio OarJeffery, Chelle, PA-C  sertraline (ZOLOFT) 100 MG tablet Take 1 tablet (100 mg total) by mouth daily. 04/27/17   Porfirio OarJeffery, Chelle, PA-C       Objective:   Physical Exam  Constitutional: She is oriented to person, place, and time. She appears well-developed and well-nourished. No distress.  HENT:  Head: Normocephalic and atraumatic.  Right Ear: External ear normal.  Left Ear: External ear normal.  Nose: Nose normal.  Mouth/Throat: Oropharynx is clear and moist. No oropharyngeal exudate.  Eyes: Pupils are equal, round, and reactive to light. Conjunctivae are normal. Right eye exhibits no discharge. Left eye exhibits no discharge.  Neck: Normal  range of motion. Neck supple. No tracheal deviation present. No thyromegaly present.  Cardiovascular: Normal rate, regular rhythm, normal heart sounds and intact distal pulses. Exam reveals no gallop and no friction rub.  No murmur heard. Pulmonary/Chest: Effort normal and breath sounds normal. No respiratory distress. She has no  wheezes. She has no rales. She exhibits no tenderness.  Abdominal: Soft. Bowel sounds are normal. She exhibits no distension.  Musculoskeletal: Normal range of motion. She exhibits no edema, tenderness or deformity.  Lymphadenopathy:    She has cervical adenopathy.  Neurological: She is alert and oriented to person, place, and time. She has normal reflexes.  Skin: Skin is warm and dry. No rash noted. She is not diaphoretic. No erythema.  Psychiatric: She has a normal mood and affect. Her behavior is normal.          Assessment & Plan:  1. Essential hypertension Lisinopril-HCTZ switched to Losartan due to possible ACEi cough. Will f/u in 4 weeks to rchk BP and cough.  - CBC with Differential/Platelet - Comprehensive metabolic panel - TSH - losartan (COZAAR) 50 MG tablet; Take 1 tablet (50 mg total) by mouth daily.  Dispense: 90 tablet; Refill: 3  2. Hyperglycemia Labs will be reviewed and pt will be informed of results. - Comprehensive metabolic panel - Hemoglobin A1c  3. Hyperlipidemia LDL goal <100 Pt taking medications well and has had no issues. Will review labs and f/u - Comprehensive metabolic panel - Lipid panel - atorvastatin (LIPITOR) 20 MG tablet; TAKE 1 TABLET EVERY DAY AT 6 PM  Dispense: 90 tablet; Refill: 3  4. Vitamin D insufficiency Labs will be reviewed and f/u - VITAMIN D 25 Hydroxy (Vit-D Deficiency, Fractures)  5. Other depression Doing well with sertraline and would like to continue. Pt informed she can take 1/2 or 1 whole pill depending on her mood.  6. Cough Lisinopril-HCTZ dc'ed and losartan prescribed. Will f/u in a month to note changes.

## 2017-10-29 NOTE — Assessment & Plan Note (Signed)
Has been well controlled x 2 years. Await lab results.

## 2017-10-29 NOTE — Assessment & Plan Note (Signed)
Stable, but still has feelings of depression and is thinking about increasing the dose of sertraline from 50 mg to 100 mg.

## 2017-10-29 NOTE — Progress Notes (Signed)
Patient ID: ZOHA SPRANGER, female    DOB: Aug 22, 1955, 62 y.o.   MRN: [id number]  PCP: Porfirio Oar, PA-C  Chief Complaint  Patient presents with  . Follow-up    CVA  . URI    x couple days, rn, sneezing cough.  Taking nyquil for sxs none taken last night and is helping some    Subjective:   Presents for evaluation of HTN, hyperlipidemia, hyperglycemia, late effects of stroke (09/2014).  Cardiology follows her monthly for loop recorder download/interrogation. In addition, she has several days of sneezing, runny nose and increased cough.  A1C 5.5% x 2 years. LDL <100 x 2 years.  No home monitoring. Takes her medications as prescribed. No adverse medication effects.  Mild cough since her visit in January. Non-productive. Isn't sure if she ever really got well after the illness she had in January. New sneezing and drainage in the past couple of days.  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 That will wrap up in a couple of weeks, and her exercise will increase back to 7 days/week.  Continues sertraline 50 mg daily, thinking about increasing up to 100 mg.     Review of Systems  Constitutional: Negative.   HENT: Negative for sore throat.   Eyes: Negative for visual disturbance.  Respiratory: Negative for cough, chest tightness, shortness of breath and wheezing.   Cardiovascular: Negative for chest pain and palpitations.  Gastrointestinal: Negative for abdominal pain, diarrhea, nausea and vomiting.  Genitourinary: Negative for dysuria, frequency, hematuria and urgency.  Musculoskeletal: Negative for arthralgias and myalgias.  Skin: Negative for rash.  Neurological: Negative for dizziness, weakness and headaches.  Psychiatric/Behavioral: Positive for dysphoric mood (intermittently). Negative for decreased concentration. The patient is not nervous/anxious.        Patient Active Problem List   Diagnosis Date Noted  . Vitamin D insufficiency 10/20/2016  .  Depression 02/04/2015  . 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 History of TIA (transient ischemic attack) and stroke 10/03/2014  . Hyperlipidemia LDL goal <100 10/03/2014  . HTN (hypertension) 09/27/2014  . Hyperglycemia   . BMI 37.0-37.9, adult      Prior to Admission medications   Medication Sig Start Date End Date Taking? Authorizing Provider  atorvastatin (LIPITOR) 20 MG tablet TAKE 1 TABLET EVERY DAY AT 6 PM 10/22/17  Yes Georgeanne Frankland, PA-C  Cholecalciferol (VITAMIN D) 2000 UNITS tablet Take 2,000 Units by mouth daily.   Yes [provider]  clopidogrel (PLAVIX) 75 MG tablet Take 1 tablet (75 mg total) by mouth daily. 04/27/17  Yes Kiley Torrence, PA-C  lisinopril-hydrochlorothiazide (PRINZIDE,ZESTORETIC) 20-12.5 MG tablet Take 0.5 tablets by mouth daily. 04/27/17  Yes Shondell Poulson, PA-C  sertraline (ZOLOFT) 100 MG tablet Take 1 tablet (100 mg total) by mouth daily. 04/27/17  Yes Porfirio Oar, PA-C     Allergies  Allergen Reactions  . Codeine Other (See Comments)    Husband & patient unaware of reaction.        Objective:  Physical Exam  Constitutional: She is oriented to person, place, and time. She appears well-developed and well-nourished. She is active and cooperative. No distress.  BP 133/82 (BP Location: Left Arm, Patient Position: Sitting, Cuff Size: Large)   Pulse 81   Temp 98.6 F (37 C) (Oral)   Resp 17   Ht 5' 1.5" (1.562 m)   Wt 214 lb 12.8 oz (97.4 kg)   SpO2 98%   BMI 39.93 kg/m  HENT:  Head: Normocephalic and atraumatic.  Right Ear: Hearing normal.  Left Ear: Hearing normal.  Eyes: Conjunctivae are normal. No scleral icterus.  Neck: Normal range of motion. Neck supple. No thyromegaly present.  Cardiovascular: Normal rate, regular rhythm and normal heart sounds.  Pulses:      Radial pulses are 2+ on the right side, and 2+ on the left side.  Pulmonary/Chest: Effort normal and breath sounds normal.    Lymphadenopathy:       Head (right side): No tonsillar, no preauricular, no posterior auricular and no occipital adenopathy present.       Head (left side): No tonsillar, no preauricular, no posterior auricular and no occipital adenopathy present.    She has no cervical adenopathy.       Right: No supraclavicular adenopathy present.       Left: No supraclavicular adenopathy present.  Neurological: She is alert and oriented to person, place, and time. No sensory deficit.  Skin: Skin is warm, dry and intact. No rash noted. No cyanosis or erythema. Nails show no clubbing.  Psychiatric: She has a normal mood and affect. Her speech is normal and behavior is normal.    Wt Readings from Last 3 Encounters:  10/29/17 214 lb 12.8 oz (97.4 kg)  08/13/17 217 lb 3.2 oz (98.5 kg)  04/27/17 212 lb 12.8 oz (96.5 kg)       Assessment & Plan:   Problem List Items Addressed This Visit    HTN (hypertension) - Primary    Controlled, but suspect ACEI cough. STOP lisinoprilHCTZ. START losartan 50 mg daily.       Relevant Medications   atorvastatin (LIPITOR) 20 MG tablet   losartan (COZAAR) 50 MG tablet   Other Relevant Orders   CBC with Differential/Platelet   Comprehensive metabolic panel   TSH   Hyperglycemia    Has been well controlled x 2 years. Await lab results.      Relevant Orders   Comprehensive metabolic panel   Hemoglobin A1c   Hyperlipidemia LDL goal <100    Has been controlled x 2 years. Await lab results. Continue atorvastatin.      Relevant Medications   atorvastatin (LIPITOR) 20 MG tablet   losartan (COZAAR) 50 MG tablet   Other Relevant Orders   Comprehensive metabolic panel   Lipid panel   Depression    Stable, but still has feelings of depression and is thinking about increasing the dose of sertraline from 50 mg to 100 mg.      Vitamin D insufficiency    Await lab results.      Relevant Orders   VITAMIN D 25 Hydroxy (Vit-D Deficiency, Fractures)    Other  Visit Diagnoses    Cough       Possible ACEI cough, possible viral URI. STOP ACEI. Supportive care.       Return in about 1 month (around 11/26/2017) for re-evaluation of blood pressure.   Fernande Brashelle S. Shatasha Lambing, PA-C [facility] at [facility]

## 2017-10-29 NOTE — Patient Instructions (Signed)
     IF you received an x-ray today, you will receive an invoice from [facility]. Please contact [facility] at [phone] with questions or concerns regarding your invoice.   IF you received labwork today, you will receive an invoice from [facility]. Please contact [facility] at [phone] with questions or concerns regarding your invoice.   Our billing staff will not be able to assist you with questions regarding bills from these companies.  You will be contacted with the lab results as soon as they are available. The fastest way to get your results is to activate your My Chart account. Instructions are located on the last [place] of this paperwork. If you have not heard from us regarding the results in 2 weeks, please contact this office.     

## 2017-10-29 NOTE — Assessment & Plan Note (Signed)
Has been controlled x 2 years. Await lab results. Continue atorvastatin.

## 2017-10-30 LAB — CBC WITH DIFFERENTIAL/PLATELET
Basophils Absolute: 0 10*3/uL (ref 0.0–0.2)
Basos: 0 %
EOS (ABSOLUTE): 0.1 10*3/uL (ref 0.0–0.4)
EOS: 2 %
Hematocrit: 39.2 % (ref 34.0–46.6)
Hemoglobin: 12.5 g/dL (ref 11.1–15.9)
IMMATURE GRANULOCYTES: 0 %
Immature Grans (Abs): 0 10*3/uL (ref 0.0–0.1)
Lymphocytes Absolute: 1.3 10*3/uL (ref 0.7–3.1)
Lymphs: 17 %
MCH: 30.7 pg (ref 26.6–33.0)
MCHC: 31.9 g/dL (ref 31.5–35.7)
MCV: 96 fL (ref 79–97)
MONOCYTES: 7 %
Monocytes Absolute: 0.5 10*3/uL (ref 0.1–0.9)
NEUTROS PCT: 74 %
Neutrophils Absolute: 5.9 10*3/uL (ref 1.4–7.0)
PLATELETS: 258 10*3/uL (ref 150–379)
RBC: 4.07 x10E6/uL (ref 3.77–5.28)
RDW: 13.7 % (ref 12.3–15.4)
WBC: 7.9 10*3/uL (ref 3.4–10.8)

## 2017-10-30 LAB — COMPREHENSIVE METABOLIC PANEL
ALT: 20 IU/L (ref 0–32)
AST: 24 IU/L (ref 0–40)
Albumin/Globulin Ratio: 1.5 (ref 1.2–2.2)
Albumin: 3.9 g/dL (ref 3.6–4.8)
Alkaline Phosphatase: 99 IU/L (ref 39–117)
BUN/Creatinine Ratio: 14 (ref 12–28)
BUN: 12 mg/dL (ref 8–27)
Bilirubin Total: 0.5 mg/dL (ref 0.0–1.2)
CALCIUM: 9.2 mg/dL (ref 8.7–10.3)
CO2: 25 mmol/L (ref 20–29)
CREATININE: 0.87 mg/dL (ref 0.57–1.00)
Chloride: 103 mmol/L (ref 96–106)
GFR calc Af Amer: 84 mL/min/{1.73_m2} (ref >59)
GFR, EST NON AFRICAN AMERICAN: 73 mL/min/{1.73_m2} (ref >59)
GLUCOSE: 93 mg/dL (ref 65–99)
Globulin, Total: 2.6 g/dL (ref 1.5–4.5)
POTASSIUM: 4.3 mmol/L (ref 3.5–5.2)
Sodium: 141 mmol/L (ref 134–144)
Total Protein: 6.5 g/dL (ref 6.0–8.5)

## 2017-10-30 LAB — CUP PACEART REMOTE DEVICE CHECK
Implantable Pulse Generator Implant Date: 20160310
MDC IDC SESS DTM: 20190227211048

## 2017-10-30 LAB — LIPID PANEL
CHOL/HDL RATIO: 3.6 ratio (ref 0.0–4.4)
Cholesterol, Total: 154 mg/dL (ref 100–199)
HDL: 43 mg/dL (ref >39)
LDL CALC: 83 mg/dL (ref 0–99)
TRIGLYCERIDES: 139 mg/dL (ref 0–149)
VLDL Cholesterol Cal: 28 mg/dL (ref 5–40)

## 2017-10-30 LAB — TSH: TSH: 2.82 u[IU]/mL (ref 0.450–4.500)

## 2017-10-30 LAB — HEMOGLOBIN A1C
Est. average glucose Bld gHb Est-mCnc: 111 mg/dL
Hgb A1c MFr Bld: 5.5 % (ref 4.8–5.6)

## 2017-10-30 LAB — VITAMIN D 25 HYDROXY (VIT D DEFICIENCY, FRACTURES): VIT D 25 HYDROXY: 36.8 ng/mL (ref 30.0–100.0)

## 2017-11-01 ENCOUNTER — Encounter: Payer: Self-pay | Admitting: Physician Assistant

## 2017-11-02 ENCOUNTER — Encounter: Payer: Self-pay | Admitting: Physician Assistant

## 2017-11-04 ENCOUNTER — Other Ambulatory Visit: Payer: Self-pay | Admitting: Physician Assistant

## 2017-11-04 DIAGNOSIS — Z1231 Encounter for screening mammogram for malignant neoplasm of breast: Secondary | ICD-10-CM

## 2017-11-15 ENCOUNTER — Telehealth: Payer: Self-pay

## 2017-11-15 NOTE — Telephone Encounter (Signed)
Called pt. Pt has already sent mychart that was forwarded to you. Pt stated that she was actually cutting lisinopril in half and only taking half the dosage throughout the day due to dizziness. Pt is scared that the 50mg  dose of losartan may be too strong considering what she was taking before. Pt has not tried the new dose yet and would like provider guidance.   Copied from CRM [phone]. Topic: General - Other >> Nov 15, 2017 11:00 AM Percival SpanishKennedy, Cheryl W wrote:  Pt call to say there is a big jump in mg from LISINOPRIL HCTZ HALF A TABLET OF THE 20-12.5  to losartan (COZAAR) 50 MG tablet. She said this is such a big jump in MG and is asking if this ok

## 2017-11-15 NOTE — Telephone Encounter (Signed)
I understand that she is concerned, as this seems like a "big" dose. However, losartan 50 mg is not high, relative to what she was taking before. Please reassure her that we cannot compare the mg of one medication to another, we often describe this as comparing "apples to oranges." If she would be more comfortable, she can break the losartan 50 mg in half, and try the 25 mg dose for a few days, but I do not think that will adequately control her BP. If it does, then great!

## 2017-11-16 NOTE — Telephone Encounter (Signed)
Left detailed VM for pt.

## 2017-11-29 ENCOUNTER — Ambulatory Visit (INDEPENDENT_AMBULATORY_CARE_PROVIDER_SITE_OTHER): Payer: BC Managed Care – PPO | Admitting: *Deleted

## 2017-11-29 DIAGNOSIS — I6389 Other cerebral infarction: Secondary | ICD-10-CM | POA: Diagnosis not present

## 2017-11-29 LAB — CUP PACEART REMOTE DEVICE CHECK
Date Time Interrogation Session: 20190401214032
MDC IDC PG IMPLANT DT: 20160310

## 2017-11-29 NOTE — Progress Notes (Signed)
Carelink Summary Report / Loop Recorder 

## 2017-11-30 ENCOUNTER — Ambulatory Visit: Payer: BC Managed Care – PPO | Admitting: Physician Assistant

## 2017-12-21 LAB — CUP PACEART REMOTE DEVICE CHECK
Implantable Pulse Generator Implant Date: 20160310
MDC IDC SESS DTM: 20190504220955

## 2017-12-30 ENCOUNTER — Ambulatory Visit (INDEPENDENT_AMBULATORY_CARE_PROVIDER_SITE_OTHER): Payer: BC Managed Care – PPO | Admitting: *Deleted

## 2017-12-30 DIAGNOSIS — I6389 Other cerebral infarction: Secondary | ICD-10-CM | POA: Diagnosis not present

## 2017-12-31 NOTE — Progress Notes (Signed)
Carelink Summary Report / Loop Recorder 

## 2018-01-05 ENCOUNTER — Encounter: Payer: Self-pay | Admitting: Physician Assistant

## 2018-01-05 ENCOUNTER — Other Ambulatory Visit: Payer: Self-pay

## 2018-01-05 ENCOUNTER — Ambulatory Visit: Payer: BC Managed Care – PPO | Admitting: Physician Assistant

## 2018-01-05 VITALS — BP 144/84 | HR 79 | Temp 98.8°F | Ht 61.0 in | Wt 218.0 lb

## 2018-01-05 DIAGNOSIS — F330 Major depressive disorder, recurrent, mild: Secondary | ICD-10-CM

## 2018-01-05 DIAGNOSIS — I10 Essential (primary) hypertension: Secondary | ICD-10-CM | POA: Diagnosis not present

## 2018-01-05 DIAGNOSIS — I6931 Attention and concentration deficit following cerebral infarction: Secondary | ICD-10-CM | POA: Diagnosis not present

## 2018-01-05 MED ORDER — LOSARTAN POTASSIUM 100 MG PO TABS: 100.0000 mg | ORAL_TABLET | Freq: Every day | ORAL | 3 refills | Status: DC

## 2018-01-05 MED ORDER — BUPROPION HCL ER (XL) 150 MG PO TB24: 150.0000 mg | ORAL_TABLET | Freq: Every day | ORAL | 3 refills | Status: DC

## 2018-01-05 NOTE — Progress Notes (Signed)
Subjective:    Patient ID: Shawna Waters, female    DOB: 1955-11-23, 62 y.o.   MRN: [id number]  Shawna Waters is a 62 year old female presenting for follow-up of blood pressure. She is not currently checking her blood pressure at home. She states she normally comes to her office appointments in the mornings and wonders if time of day can affect blood pressure readings. She is taking losartan 50mg  daily as prescribed (started 10/29/17) and states the lisinopril associated cough declined after a couple weeks of switching to the losartan. However, she does note increased tiredness over the past month - "I just don't have any energy anymore." She notes she experienced this fatigue while on lisinopril to which her dose was decreased and her symptoms improved.   Her stress level has increased and she is highly distraught over the unknown etiology of her stroke about 3 years ago. She still has speech stuttering and memory lapses. She sleeps well at night, but is tired throughout the day. For exercise she walks about 3 miles, 3-4x weekly. She has a family vacation to [facility] next week which she is looking forward to.   She is still taking 50mg  of zoloft daily rather than the 100mg  that it was changed to because she wants to try and deal with the stress and worry by herself. She also expresses concern of not wanting to become addicted to her medication.  She also expresses a decrease in appetite. Overall she states that she "wants everything to be right like it used to be, but it's not going to be."    Review of Systems  Constitutional: Positive for fatigue. Negative for chills and fever.  HENT: Positive for rhinorrhea. Negative for congestion, sore throat and tinnitus.   Eyes: Negative for photophobia, pain and visual disturbance.  Respiratory: Negative for cough, chest tightness and shortness of breath.   Cardiovascular: Negative for chest pain and palpitations.  Gastrointestinal: Negative for  abdominal pain, blood in stool, constipation, diarrhea, nausea and vomiting.  Endocrine: Negative for cold intolerance and heat intolerance.  Genitourinary: Negative for dysuria, frequency and hematuria.  Musculoskeletal: Negative for arthralgias and myalgias.  Neurological: Positive for speech difficulty (sometimes stutters). Negative for dizziness, tremors, weakness and light-headedness.       Wax and wane tingling in head  Psychiatric/Behavioral: Positive for agitation, confusion and decreased concentration. The patient is nervous/anxious.        Overwhelmed feeling constantly (lately)   Patient Active Problem List   Diagnosis Date Noted  . Vitamin D insufficiency 10/20/2016  . Depression 02/04/2015  . 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 History of TIA (transient ischemic attack) and stroke 10/03/2014  . Hyperlipidemia LDL goal <100 10/03/2014  . HTN (hypertension) 09/27/2014  . Hyperglycemia   . BMI 37.0-37.9, adult    Prior to Admission medications   Medication Sig Start Date End Date Taking? Authorizing Provider  atorvastatin (LIPITOR) 20 MG tablet TAKE 1 TABLET EVERY DAY AT 6 PM 10/29/17  Yes Jeffery, Chelle, PA-C  Cholecalciferol (VITAMIN D) 2000 UNITS tablet Take 2,000 Units by mouth daily.   Yes [provider]  clopidogrel (PLAVIX) 75 MG tablet Take 1 tablet (75 mg total) by mouth daily. 04/27/17  Yes Jeffery, Chelle, PA-C  sertraline (ZOLOFT) 100 MG tablet Take 1 tablet (100 mg total) by mouth daily. 04/27/17  Yes Jeffery, Chelle, PA-C  buPROPion (WELLBUTRIN XL) 150 MG 24 hr tablet Take 1 tablet (150 mg total)  by mouth daily. 01/05/18   Porfirio OarJeffery, Chelle, PA-C  losartan (COZAAR) 100 MG tablet Take 1 tablet (100 mg total) by mouth daily. 01/05/18   Porfirio OarJeffery, Chelle, PA-C   Allergies  Allergen Reactions  . Codeine Other (See Comments)    Husband & patient unaware of reaction.         Objective:   Physical Exam  Constitutional: She is  oriented to person, place, and time. She appears well-developed and well-nourished. No distress.  HENT:  Head: Normocephalic and atraumatic.  Eyes: Pupils are equal, round, and reactive to light.  Neck: No thyromegaly present.  Cardiovascular: Normal rate, regular rhythm and normal heart sounds. Exam reveals no gallop and no friction rub.  No murmur heard. Pulses:      Radial pulses are 2+ on the right side, and 2+ on the left side.  Pulmonary/Chest: Effort normal and breath sounds normal. No stridor. She has no wheezes. She has no rales.  Lymphadenopathy:    She has no cervical adenopathy.  Neurological: She is alert and oriented to person, place, and time.  Skin: Skin is warm and dry.          Assessment & Plan:  1. Essential hypertension There was confusion concerning whether she was still taking lisinopril or losartan. Ensured she is taking losartan. INCREASED dose to attain better control. Re-evaluate at next visit. - losartan (COZAAR) 100 MG tablet; Take 1 tablet (100 mg total) by mouth daily.  Dispense: 90 tablet; Refill: 3  2. Mild episode of recurrent major depressive disorder (HCC) -Spent time sympathizing with patient about the challenges of her situation post stroke. -Encouraged her to alter her perception about her stroke in that she now has a new perspective to offer to students and others who are going through difficult times. -Suggested 'naming' her stroke to help alleviate negative connotations and to be able to joke with her sister about her memory.  -Provided list of local counselors. This would be highly beneficial for her to verbally process the challenges she is facing.  - Continue taking 50mg  of losartan and ADD bupropion to better address mood.  - buPROPion (WELLBUTRIN XL) 150 MG 24 hr tablet; Take 1 tablet (150 mg total) by mouth daily.  Dispense: 90 tablet; Refill: 3  3. 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    Reassured patient that none of her medications have addictive properties.  Schedule follow-up appointment for October. Contact Chelle at new office if earlier visit is required.

## 2018-01-05 NOTE — Progress Notes (Signed)
Patient ID: Shawna Waters, female    DOB: 1956-06-24, 62 y.o.   MRN: [id number]  PCP: Porfirio Oar, PA-C  Chief Complaint  Patient presents with  . Blood Pressure Check    Subjective:   Presents for evaluation of HTN.  Reports increasing fatigue and stress over the past year. Worried that etiology of her stroke is unknown, and that she continues to struggle with speech (stuttering) and attention/concentration, memory. She recalls fatigue associated with lisinopril following her stroke, that improved with dose reduction.  Associated decreased appetite. Feels overwhelmed by tasks that were previously simple.  Did not increase the sertraline from 50 mg to 100 mg recommended at her last visit, desiring lifestyle management of symptoms and minimal medications.   At her visit 10/29/2017, lisinopril was stopped and losartan started, due to possible ACEI cough. Medication reconciliation at today's visit may have mixed up the two medications.  Labs at that visit were normal: CBC, CMET, lipids, A1C and Vitamin D.  Intermittent tingling in her head, not associated with headache, visual changes, dizziness.   Review of Systems Constitutional: Positive for fatigue. Negative for chills and fever.  HENT: Positive for rhinorrhea. Negative for congestion, sore throat and tinnitus.   Eyes: Negative for photophobia, pain and visual disturbance.  Respiratory: Negative for cough, chest tightness and shortness of breath.   Cardiovascular: Negative for chest pain and palpitations.  Gastrointestinal: Negative for abdominal pain, blood in stool, constipation, diarrhea, nausea and vomiting.  Endocrine: Negative for cold intolerance and heat intolerance.  Genitourinary: Negative for dysuria, frequency and hematuria.  Musculoskeletal: Negative for arthralgias and myalgias.  Neurological: Positive for speech difficulty (sometimes stutters). Negative for dizziness, tremors, weakness and  light-headedness.  Psychiatric/Behavioral: Positive for agitation, confusion and decreased concentration. The patient is nervous/anxious.        Overwhelmed feeling constantly (lately)       Patient Active Problem List   Diagnosis Date Noted  . Vitamin D insufficiency 10/20/2016  . Depression 02/04/2015  . 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 History of TIA (transient ischemic attack) and stroke 10/03/2014  . Hyperlipidemia LDL goal <100 10/03/2014  . HTN (hypertension) 09/27/2014  . Hyperglycemia   . BMI 37.0-37.9, adult      Prior to Admission medications   Medication Sig Start Date End Date Taking? Authorizing Provider  atorvastatin (LIPITOR) 20 MG tablet TAKE 1 TABLET EVERY DAY AT 6 PM 10/29/17  Yes Sukari Grist, PA-C  Cholecalciferol (VITAMIN D) 2000 UNITS tablet Take 2,000 Units by mouth daily.   Yes [provider]  clopidogrel (PLAVIX) 75 MG tablet Take 1 tablet (75 mg total) by mouth daily. 04/27/17  Yes Porfirio Oar, PA-C  Losartan 50 mg 1 PO QD 10/29/2017  Yes Stuart Mirabile PA  sertraline (ZOLOFT) 100 MG tablet Take 1 tablet (100 mg total) by mouth daily. 04/27/17  Yes Porfirio Oar, PA-C     Allergies  Allergen Reactions  . Codeine Other (See Comments)    Husband & patient unaware of reaction.        Objective:  Physical Exam  Constitutional: She is oriented to person, place, and time. She appears well-developed and well-nourished. She is active and cooperative. No distress.  BP (!) 144/84 (BP Location: Left Arm, Patient Position: Sitting, Cuff Size: Large)   Pulse 79   Temp 98.8 F (37.1 C) (Oral)   Ht 5\' 1"  (1.549 m)   Wt 218 lb (98.9 kg)  SpO2 98%   BMI 41.19 kg/m   HENT:  Head: Normocephalic and atraumatic.  Right Ear: Hearing normal.  Left Ear: Hearing normal.  Eyes: Conjunctivae are normal. No scleral icterus.  Neck: Normal range of motion. Neck supple. No thyromegaly present.  Cardiovascular:  Normal rate, regular rhythm and normal heart sounds.  Pulses:      Radial pulses are 2+ on the right side, and 2+ on the left side.  Pulmonary/Chest: Effort normal and breath sounds normal.  Lymphadenopathy:       Head (right side): No tonsillar, no preauricular, no posterior auricular and no occipital adenopathy present.       Head (left side): No tonsillar, no preauricular, no posterior auricular and no occipital adenopathy present.    She has no cervical adenopathy.       Right: No supraclavicular adenopathy present.       Left: No supraclavicular adenopathy present.  Neurological: She is alert and oriented to person, place, and time. No sensory deficit.  Skin: Skin is warm, dry and intact. No rash noted. No cyanosis or erythema. Nails show no clubbing.  Psychiatric: Her speech is normal and behavior is normal. Her mood appears not anxious. Her affect is labile. Her affect is not angry, not blunt and not inappropriate. She is not actively hallucinating. She exhibits a depressed mood. She is attentive.    Wt Readings from Last 3 Encounters:  01/05/18 218 lb (98.9 kg)  10/29/17 214 lb 12.8 oz (97.4 kg)  08/13/17 217 lb 3.2 oz (98.5 kg)          Assessment & Plan:   Problem List Items Addressed This Visit    HTN (hypertension) - Primary    Above goal.  Increase losartan from 50 mg to 100 mg daily.      Relevant Medications   losartan (COZAAR) 100 MG tablet   Depression    Inadequately controlled.  Add bupropion XL 150 mg daily.  If tolerates this well and finds improvement in her fatigue and mood, would consider reducing or discontinuing sertraline.      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          No follow-ups on file.   Fernande Brashelle S. Aerie Donica, PA-C [facility] at [facility]

## 2018-01-05 NOTE — Patient Instructions (Addendum)
1. INCREASE the losartan to 100 mg. If that feels too strong, it's ok to take 50 mg twice each day.  2. Continue the sertraline at the 50 mg (1/2 of the 100 mg tablet).  3. ADD the bupropion (Wellbutrin XL) 150 mg each MORNING.  4. NAME the stroke. Then you can say things like, "Well, no, I don't remember that, that was after Sallye OberLouise (or whatever you name it) came." when Lupita LeashDonna says "Don't you remember?"  5. Come see me sooner than October if you need to.  6. Consider seeing a therapist. [facility] ([address]Beth Coralie CommonKincaid, Ernest McCoy, Heather HartfordKitchens) - [phone] [facility] [address](Julie Whitt, Camelia Engerri SalemBauert) - [phone] [facility] - [phone] [facility] - [phone] Buena IrishBob Mylan - [phone] Jeanella FlatterySarah Young - [facility], [phone] [facility]  - [phone] (do not file insurance) Paula ComptonKarla [phone] Doyne KeelKaren Elliot - [phone] Jeanella FlatterySarah Young - [facility], [phone] [facility] - [phone]    IF you received an x-ray today, you will receive an invoice from [facility]. Please contact [facility] at [phone] with questions or concerns regarding your invoice.   IF you received labwork today, you will receive an invoice from [facility]. Please contact [facility] at [phone] with questions or concerns regarding your invoice.   Our billing staff will not be able to assist you with questions regarding bills from these companies.  You will be contacted with the lab results as soon as they are available. The fastest way to get your results is to activate your My Chart account. Instructions are located on the last Mathwig of this paperwork. If you have not heard from us regarding the results in 2 weeks, please contact this office.

## 2018-01-07 DIAGNOSIS — I6931 Attention and concentration deficit following cerebral infarction: Secondary | ICD-10-CM | POA: Insufficient documentation

## 2018-01-07 NOTE — Assessment & Plan Note (Signed)
Inadequately controlled.  Add bupropion XL 150 mg daily.  If tolerates this well and finds improvement in her fatigue and mood, would consider reducing or discontinuing sertraline.

## 2018-01-07 NOTE — Assessment & Plan Note (Signed)
Above goal.  Increase losartan from 50 mg to 100 mg daily.

## 2018-01-07 NOTE — Assessment & Plan Note (Signed)
Add bupropion XL 150 mg daily

## 2018-01-21 ENCOUNTER — Ambulatory Visit: Payer: BC Managed Care – PPO

## 2018-01-21 ENCOUNTER — Ambulatory Visit
Admission: RE | Admit: 2018-01-21 | Discharge: 2018-01-21 | Disposition: A | Discharge status: Home / Self Care | Payer: BC Managed Care – PPO | Source: Ambulatory Visit | Attending: Physician Assistant | Admitting: Physician Assistant

## 2018-01-21 DIAGNOSIS — Z1231 Encounter for screening mammogram for malignant neoplasm of breast: Secondary | ICD-10-CM

## 2018-02-01 ENCOUNTER — Ambulatory Visit (INDEPENDENT_AMBULATORY_CARE_PROVIDER_SITE_OTHER): Payer: BC Managed Care – PPO | Admitting: *Deleted

## 2018-02-01 DIAGNOSIS — I6389 Other cerebral infarction: Secondary | ICD-10-CM

## 2018-02-02 NOTE — Progress Notes (Signed)
Carelink Summary Report / Loop Recorder 

## 2018-02-04 ENCOUNTER — Telehealth: Payer: Self-pay | Admitting: *Deleted

## 2018-02-04 NOTE — Telephone Encounter (Signed)
Spoke with patient regarding LINQ at RRT as of 02/02/18.  Patient wishes to discuss plan with Dr. Allred.  Scheduled for appointment on 03/14/18 at 9:00am.  Patient aware to unplug home monitor, will order return kit to home address on file.  Patient appreciative of call and denies additional questions or concerns at this time. 

## 2018-02-07 LAB — CUP PACEART REMOTE DEVICE CHECK
Date Time Interrogation Session: 20190606223928
Implantable Pulse Generator Implant Date: 20160310

## 2018-03-07 ENCOUNTER — Encounter: Payer: BC Managed Care – PPO | Admitting: *Deleted

## 2018-03-10 LAB — CUP PACEART REMOTE DEVICE CHECK
Date Time Interrogation Session: 20190709233924
MDC IDC PG IMPLANT DT: 20160310

## 2018-03-14 ENCOUNTER — Encounter: Payer: Self-pay | Admitting: Internal Medicine

## 2018-03-14 ENCOUNTER — Ambulatory Visit: Payer: BC Managed Care – PPO | Admitting: Internal Medicine

## 2018-03-14 VITALS — BP 148/92 | HR 76 | Ht 61.0 in | Wt 222.4 lb

## 2018-03-14 DIAGNOSIS — I6389 Other cerebral infarction: Secondary | ICD-10-CM | POA: Diagnosis not present

## 2018-03-14 DIAGNOSIS — I10 Essential (primary) hypertension: Secondary | ICD-10-CM

## 2018-03-14 DIAGNOSIS — E663 Overweight: Secondary | ICD-10-CM

## 2018-03-14 NOTE — Progress Notes (Signed)
PCP: Porfirio OarJeffery, Chelle, PA-C   Primary EP: Dr Glenford BayleyAllred  Shawna Waters is a 62 y.o. female who presents today for routine electrophysiology followup.  She had ILR implanted for stroke.  She has had no afib detected.  Her device has reached RRT.  Today, she denies symptoms of palpitations, chest pain, shortness of breath,  lower extremity edema, dizziness, presyncope, or syncope.  The patient is otherwise without complaint today.   Past Medical History:  Diagnosis Date  . Encephalitis   . Hypertension   . Stroke (HCC)   . TIA (transient ischemic attack) 09/27/2014   Past Surgical History:  Procedure Laterality Date  . ABDOMINAL HYSTERECTOMY    . LOOP RECORDER IMPLANT N/A 10/04/2014   Procedure: LOOP RECORDER IMPLANT;  Surgeon: Hillis RangeJames Lamount Bankson, MD;  [facility];  Service: Cardiovascular;  Laterality: N/A;  . TEE WITHOUT CARDIOVERSION N/A 10/04/2014   Procedure: TRANSESOPHAGEAL ECHOCARDIOGRAM (TEE);  Surgeon: Laurey Moralealton S McLean, MD;  [facility];  Service: Cardiovascular;  Laterality: N/A;    ROS- all systems are reviewed and negatives except as per HPI above  Current Outpatient Medications  Medication Sig Dispense Refill  . atorvastatin (LIPITOR) 20 MG tablet TAKE 1 TABLET EVERY DAY AT 6 PM 90 tablet 3  . buPROPion (WELLBUTRIN XL) 150 MG 24 hr tablet Take 1 tablet (150 mg total) by mouth daily. 90 tablet 3  . Cholecalciferol (VITAMIN D) 2000 UNITS tablet Take 2,000 Units by mouth daily.    . clopidogrel (PLAVIX) 75 MG tablet Take 1 tablet (75 mg total) by mouth daily. 90 tablet 3  . losartan (COZAAR) 100 MG tablet Take 1 tablet (100 mg total) by mouth daily. 90 tablet 3  . sertraline (ZOLOFT) 100 MG tablet Take 1 tablet (100 mg total) by mouth daily. 90 tablet 3   No current facility-administered medications for this visit.     Physical Exam: Vitals:   03/14/18 0910  BP: (!) 148/92  Pulse: 76  SpO2: 96%  Weight: 222 lb 6.4 oz (100.9 kg)  Height: 5\' 1"  (1.549 m)     GEN- The patient is well appearing, alert and oriented x 3 today.   Head- normocephalic, atraumatic Eyes-  Sclera clear, conjunctiva pink Ears- hearing intact Oropharynx- clear Lungs- Clear to ausculation bilaterally, normal work of breathing Heart- Regular rate and rhythm, no murmurs, rubs or gallops, PMI not laterally displaced GI- soft, NT, ND, + BS Extremities- no clubbing, cyanosis, or edema  Wt Readings from Last 3 Encounters:  03/14/18 222 lb 6.4 oz (100.9 kg)  01/05/18 218 lb (98.9 kg)  10/29/17 214 lb 12.8 oz (97.4 kg)    ILR interrogation today is personally reviewed and shows RRT, no arrhythmias  Assessment and Plan:  1. Prior stroke She has been monitored for 3 years, with no afib observed. Device is at RRT.  Today, we discussed options of abandoning the device, removing the device, or replacing the device.  She is tearful and states that she likes the security of having the device monitoring for afib.  She is fearful of another stroke.  I did discuss AppleWatch v 4 as an alternative for monitoring for AF. She will consider options and contact my office if she decides to proceed with device replacement or removal.  Otherwise, I will see as needed. We discussed lifestyle modification today.  2. HTN Elevated BP today She is not willing to make change today Weight reduction and salt restriction advised  3. Overweight Body mass  index is 42.02 kg/m. Weight reduction and salt restriction advised  Return as needed  Hillis RangeJames Roniesha Hollingshead MD, [facility] 03/14/2018 9:40 AM

## 2018-03-14 NOTE — Patient Instructions (Addendum)
Medication Instructions:  Your physician recommends that you continue on your current medications as directed. Please refer to the Current Medication list given to you today.  Labwork: None ordered.  Testing/Procedures: None ordered.  Follow-Up: Your physician wants you to follow-up in: as needed with Dr. Johney FrameAllred.   You will receive a reminder letter in the mail two months in advance. If you don't receive a letter, please call our office to schedule the follow-up appointment.   If you decide to have your loop recorder replaced please give me a call.  Boneta LucksJenny RN [phone]

## 2018-03-15 LAB — CUP PACEART INCLINIC DEVICE CHECK
Date Time Interrogation Session: 20190820124722
MDC IDC PG IMPLANT DT: 20160310

## 2018-03-16 ENCOUNTER — Other Ambulatory Visit: Payer: Self-pay | Admitting: Internal Medicine

## 2018-07-11 ENCOUNTER — Telehealth: Payer: Self-pay | Admitting: Internal Medicine

## 2018-07-11 NOTE — Telephone Encounter (Signed)
New message   Pt c/o medication issue:  1. Name of Medication: clopidogrel (PLAVIX) 75 MG tablet  2. How are you currently taking this medication (dosage and times per day)? 1 time daily  3. Are you having a reaction (difficulty breathing--STAT)? n/a  4. What is your medication issue? Per Chelle states that the patient has had a stroke. Would like for the patient to continue to take the medication? Please call to discuss.

## 2018-07-13 NOTE — Telephone Encounter (Signed)
No cardiac indications for plavix. plavix as directed per neurology.  I will defer decisions about stroke prophylaxis to them.  No afib by ILR therefore no indication for OAC.  Hillis RangeJames Conway Fedora MD, [facility] 07/13/2018 4:17 PM

## 2018-07-13 NOTE — Telephone Encounter (Signed)
Sent message to Dr. Avelino Leedshelle.  No further action needed.

## 2018-07-14 ENCOUNTER — Telehealth: Payer: Self-pay | Admitting: *Deleted

## 2018-07-14 NOTE — Telephone Encounter (Signed)
[facility] clarifying the length of time patient should be on Plavix as she has been providing refills for pt. D/w Dr. Lucia GaskinsAhern. Pt not on Aspirin, only plavix per Chelle. Given pt's h/o stroke, pt will need to stay on this forever unless pt is diagnosed with a-fib and is on a blood thinner. Per recent note from Dr. Johney FrameAllred, no cardiac indications for plavix. No a-fib by ILR therefore no indication for OAR.

## 2018-07-23 NOTE — Telephone Encounter (Signed)
Please advise Dr Avelino Leedshelle that I can be reached anytime by cell phone. Thanks

## 2018-07-26 NOTE — Telephone Encounter (Signed)
Dr. Jenel LucksAllred's phone number given to secretary for Dr. Avelino Leedshelle.

## 2019-03-23 ENCOUNTER — Other Ambulatory Visit: Payer: Self-pay | Admitting: Physician Assistant

## 2019-03-23 DIAGNOSIS — Z1231 Encounter for screening mammogram for malignant neoplasm of breast: Secondary | ICD-10-CM

## 2019-05-08 ENCOUNTER — Other Ambulatory Visit: Payer: Self-pay

## 2019-05-08 ENCOUNTER — Ambulatory Visit
Admission: RE | Admit: 2019-05-08 | Discharge: 2019-05-08 | Disposition: A | Discharge status: Home / Self Care | Payer: BC Managed Care – PPO | Source: Ambulatory Visit | Attending: Physician Assistant | Admitting: Physician Assistant

## 2019-05-08 DIAGNOSIS — Z1231 Encounter for screening mammogram for malignant neoplasm of breast: Secondary | ICD-10-CM

## 2019-08-13 ENCOUNTER — Ambulatory Visit: Payer: BC Managed Care – PPO | Attending: Internal Medicine

## 2019-08-13 DIAGNOSIS — Z23 Encounter for immunization: Secondary | ICD-10-CM | POA: Insufficient documentation

## 2019-08-13 NOTE — Progress Notes (Signed)
   Covid-19 Vaccination Clinic  Name:  Shawna Waters    MRN: [id number] DOB: 1956/03/01  08/13/2019  Shawna Waters was observed post Covid-19 immunization for 30 mins without incidence. She was provided with Vaccine Information Sheet and instruction to access the V-Safe system.   Shawna Waters was instructed to call 911 with any severe reactions post vaccine: Marland Kitchen Difficulty breathing  . Swelling of your face and throat  . A fast heartbeat  . A bad rash all over your body  . Dizziness and weakness

## 2019-09-01 ENCOUNTER — Ambulatory Visit: Payer: BC Managed Care – PPO | Attending: Internal Medicine

## 2019-09-01 DIAGNOSIS — Z23 Encounter for immunization: Secondary | ICD-10-CM

## 2019-09-01 NOTE — Progress Notes (Signed)
   Covid-19 Vaccination Clinic  Name:  POLINA BURMASTER    MRN: [id number] DOB: 10/23/1955  09/01/2019  Ms. Goldenstein was observed post Covid-19 immunization for 15 minutes without incidence. She was provided with Vaccine Information Sheet and instruction to access the V-Safe system.   Ms. Pincock was instructed to call 911 with any severe reactions post vaccine: Marland Kitchen Difficulty breathing  . Swelling of your face and throat  . A fast heartbeat  . A bad rash all over your body  . Dizziness and weakness    Immunizations Administered    Name Date Dose VIS Date Route   Pfizer COVID-19 Vaccine 09/01/2019 11:26 AM 0.3 mL 07/07/2019 Intramuscular   Manufacturer: [employer], [employer]   Lot: [id number]   NDC: [address]-7

## 2021-08-13 ENCOUNTER — Other Ambulatory Visit: Payer: Self-pay | Admitting: Physician Assistant

## 2021-08-13 ENCOUNTER — Other Ambulatory Visit (HOSPITAL_COMMUNITY): Payer: Self-pay | Admitting: Physician Assistant

## 2021-08-13 DIAGNOSIS — M25561 Pain in right knee: Secondary | ICD-10-CM

## 2021-08-14 ENCOUNTER — Ambulatory Visit (HOSPITAL_COMMUNITY)
Admission: RE | Admit: 2021-08-14 | Discharge: 2021-08-14 | Disposition: A | Discharge status: Home / Self Care | Payer: BC Managed Care – PPO | Source: Ambulatory Visit | Attending: Physician Assistant | Admitting: Physician Assistant

## 2021-08-14 ENCOUNTER — Other Ambulatory Visit: Payer: Self-pay

## 2021-08-14 DIAGNOSIS — M25561 Pain in right knee: Secondary | ICD-10-CM | POA: Diagnosis present

## 2021-11-25 ENCOUNTER — Telehealth: Payer: Self-pay | Admitting: *Deleted

## 2021-11-25 NOTE — Telephone Encounter (Signed)
? ?  Pre-operative Risk Assessment  ?  ?Patient Name: Shawna Waters  ?DOB: 1956-07-15 ?MRN: [id number]  ? ? PT WILL REQUIRE A NEW PT APPT AS THE PT WAS LAST SEEN BY CARDIOLOGY 02/2018. WILL SEND A MESSAGE TO OUR CHART PREP TEAM FOR NEW PT APPT.  ? ?Request for Surgical Clearance   ? ?Procedure:   RIGHT KNEE SCOPE, MENISCOTOMY  ? ?Date of Surgery:  Clearance 01/01/22                              ?   ?Surgeon:  DR. MATTHEW OLIN ?Surgeon's Group or Practice Name:   ?Phone number:  [phone] ?Fax number:  [phone] ?  ?Type of Clearance Requested:   ?- Medical  ?- Pharmacy:  Hold Clopidogrel (Plavix)   ?  ?Type of Anesthesia:   CHOICE ?  ?Additional requests/questions:   ? ?Signed, ?Danielle Rankin   ?11/25/2021, 1:53 PM  ? ?

## 2021-11-26 ENCOUNTER — Telehealth: Payer: Self-pay

## 2021-11-26 NOTE — Telephone Encounter (Signed)
Notes scanned to referral 

## 2021-11-26 NOTE — Telephone Encounter (Signed)
Pt has been scheduled to see Dr. Rayann Heman, 12/04/21, clearance will be addressed at that time. ? ?Will route to the requesting surgeon's office to make them aware.  ?

## 2021-12-04 ENCOUNTER — Encounter (HOSPITAL_BASED_OUTPATIENT_CLINIC_OR_DEPARTMENT_OTHER): Payer: Self-pay | Admitting: Internal Medicine

## 2021-12-04 ENCOUNTER — Ambulatory Visit (HOSPITAL_BASED_OUTPATIENT_CLINIC_OR_DEPARTMENT_OTHER): Payer: BC Managed Care – PPO | Admitting: Internal Medicine

## 2021-12-04 VITALS — BP 122/82 | HR 90 | Ht 61.5 in | Wt 224.6 lb

## 2021-12-04 DIAGNOSIS — I10 Essential (primary) hypertension: Secondary | ICD-10-CM | POA: Diagnosis not present

## 2021-12-04 DIAGNOSIS — Z8673 Personal history of transient ischemic attack (TIA), and cerebral infarction without residual deficits: Secondary | ICD-10-CM | POA: Diagnosis not present

## 2021-12-04 NOTE — Patient Instructions (Addendum)
Medication Instructions:  ?Your physician recommends that you continue on your current medications as directed. Please refer to the Current Medication list given to you today. ?*If you need a refill on your cardiac medications before your next appointment, please call your pharmacy* ? ?Lab Work: ?None ordered. ?If you have labs (blood work) drawn today and your tests are completely normal, you will receive your results only by: ?MyChart Message (if you have MyChart) OR ?A paper copy in the mail ?If you have any lab test that is abnormal or we need to change your treatment, we will call you to review the results. ? ?Testing/Procedures: ?None ordered. ? ?Follow-Up: ?At [facility], you and your health needs are our priority.  As part of our continuing mission to provide you with exceptional heart care, we have created designated Provider Care Teams.  These Care Teams include your primary Cardiologist (physician) and Advanced Practice Providers (APPs -  Physician Assistants and Nurse Practitioners) who all work together to provide you with the care you need, when you need it. ? ?Your next appointment:   ?Your physician wants you to follow-up in:  AS needed ? ?Important Information About Sugar ? ? ? ? ? ? ? ? ?

## 2021-12-04 NOTE — Progress Notes (Signed)
? ? ?  PCP: No primary care provider on file. ?  ?Primary EP: Dr Johney Frame ? ?Shawna Waters is a 66 y.o. female who presents today for routine electrophysiology followup.  Since last being seen in our clinic, the patient reports doing very well.  Today, she denies symptoms of palpitations, chest pain, shortness of breath,  lower extremity edema, dizziness, presyncope, or syncope.  Prior to her R knee injury in February, she was active, without ischemic symptoms.  The patient is otherwise without complaint today.  ? ?Past Medical History:  ?Diagnosis Date  ? Encephalitis   ? Hypertension   ? Stroke [facility])   ? TIA (transient ischemic attack) 09/27/2014  ? ?Past Surgical History:  ?Procedure Laterality Date  ? ABDOMINAL HYSTERECTOMY    ? LOOP RECORDER IMPLANT N/A 10/04/2014  ? Procedure: LOOP RECORDER IMPLANT;  Surgeon: Hillis Range, MD;  Location: [facility] CATH LAB;  Service: Cardiovascular;  Laterality: N/A;  ? TEE WITHOUT CARDIOVERSION N/A 10/04/2014  ? Procedure: TRANSESOPHAGEAL ECHOCARDIOGRAM (TEE);  Surgeon: Laurey Morale, MD;  Location: [facility] ENDOSCOPY;  Service: Cardiovascular;  Laterality: N/A;  ? ? ?ROS- all systems are reviewed and negatives except as per HPI above ? ?Current Outpatient Medications  ?Medication Sig Dispense Refill  ? atorvastatin (LIPITOR) 20 MG tablet TAKE 1 TABLET EVERY DAY AT 6 PM 90 tablet 3  ? buPROPion (WELLBUTRIN XL) 150 MG 24 hr tablet Take 1 tablet (150 mg total) by mouth daily. 90 tablet 3  ? Cholecalciferol (VITAMIN D) 2000 UNITS tablet Take 2,000 Units by mouth daily.    ? clopidogrel (PLAVIX) 75 MG tablet Take 1 tablet (75 mg total) by mouth daily. 90 tablet 3  ? olmesartan-hydrochlorothiazide (BENICAR HCT) 40-12.5 MG tablet olmesartan 40 mg-hydrochlorothiazide 12.5 mg tablet    ? sertraline (ZOLOFT) 100 MG tablet Take 1 tablet (100 mg total) by mouth daily. 90 tablet 3  ? ?No current facility-administered medications for this visit.  ? ? ?Physical Exam: ?Vitals:  ? 12/04/21 1406  ?BP: 122/82   ?Pulse: 90  ?Weight: 224 lb 10.4 oz (101.9 kg)  ?Height: 5' 1.5" (1.562 m)  ? ? ?GEN- The patient is well appearing, alert and oriented x 3 today.   ?Head- normocephalic, atraumatic ?Eyes-  Sclera clear, conjunctiva pink ?Ears- hearing intact ?Oropharynx- clear ?Lungs- Clear to ausculation bilaterally, normal work of breathing ?Heart- Regular rate and rhythm, no murmurs, rubs or gallops, PMI not laterally displaced ?GI- soft, NT, ND, + BS ?Extremities- no clubbing, cyanosis, or edema ?R knee is in a brace ? ?Wt Readings from Last 3 Encounters:  ?12/04/21 224 lb 10.4 oz (101.9 kg)  ?03/14/18 222 lb 6.4 oz (100.9 kg)  ?01/05/18 218 lb (98.9 kg)  ? ? ?EKG tracing ordered today is personally reviewed and shows sinus ? ?Assessment and Plan: ? ?HTN ?Stable ?No change required today ? ?2. Prior stroke ?S/p prior ILR ?Device is no longer functioning  ?I did offer to remove this.  She will contact our office if she decides to proceed ?Ok to hold plavix x 4-5 days prior to knee surgery and resume as soon as able post operatively ?She may benefit from neurology follow-up electively ? ?3. Preop ?No ischemic symptoms with activity. ?She is low risk for low risk procedure.  Ok to proceed without further CV testing. ? ?Return prn.  ? ?Hillis Range MD, [facility] ?12/04/2021 ?2:35 PM ? ? ? ? ?

## 2021-12-10 NOTE — Telephone Encounter (Signed)
Office note routed to Dr. Charlann Boxer. ?

## 2022-04-30 LAB — COLOGUARD: COLOGUARD: NEGATIVE

## 2022-04-30 LAB — EXTERNAL GENERIC LAB PROCEDURE: COLOGUARD: NEGATIVE

## 2022-06-17 IMAGING — MR MR KNEE*R* W/O CM
4 of 6 series · 19 of 40 positions shown · non-contrast
Comparison: None.

CLINICAL DATA: Posterior knee pain after twisting injury yesterday.

EXAM:
MRI OF THE RIGHT KNEE WITHOUT CONTRAST
TECHNIQUE: Multiplanar, multisequence MR imaging of the knee was performed. No
intravenous contrast was administered.

[Series 2: T2 fat-sat · axial · 4.0mm · 0.33mm/px · z∈[+3,+83]mm · 3 of 24 slices shown (1 of 2)]
[im 4/24]
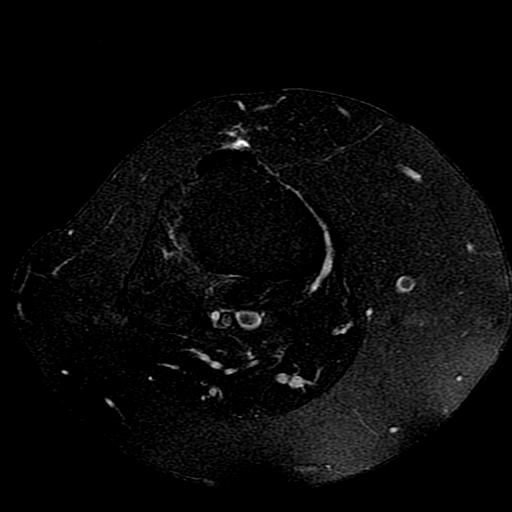
[im 14/24]
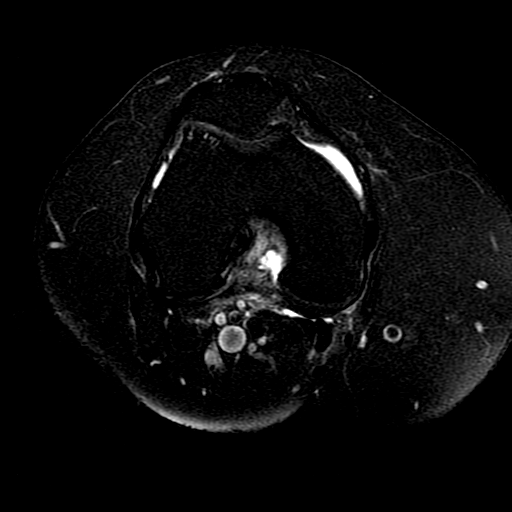
[im 20/24]
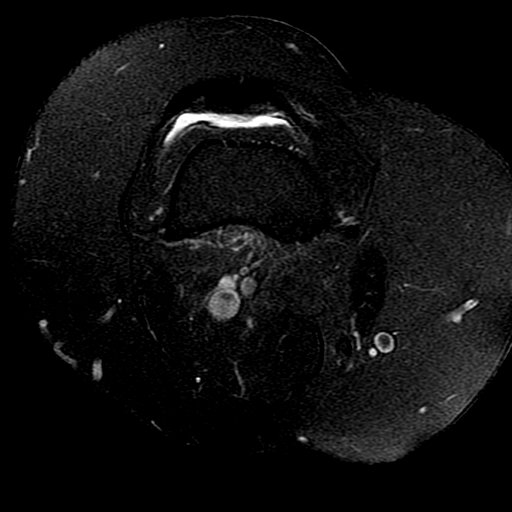

[Series 4: T2 fat-sat · coronal · 4.0mm · 0.31mm/px · 3 of 21 slices shown (2 of 2)]
[im 5/21]
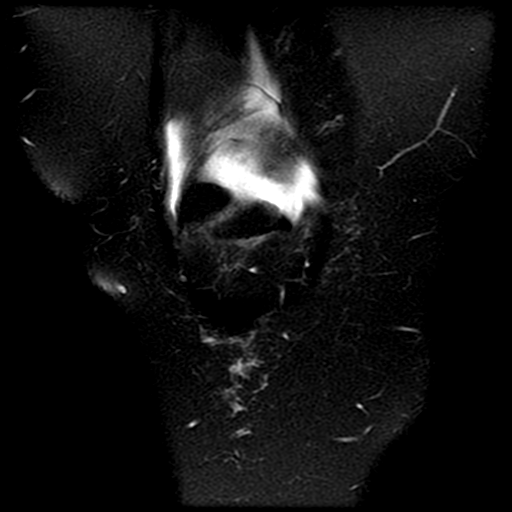
[im 13/21]
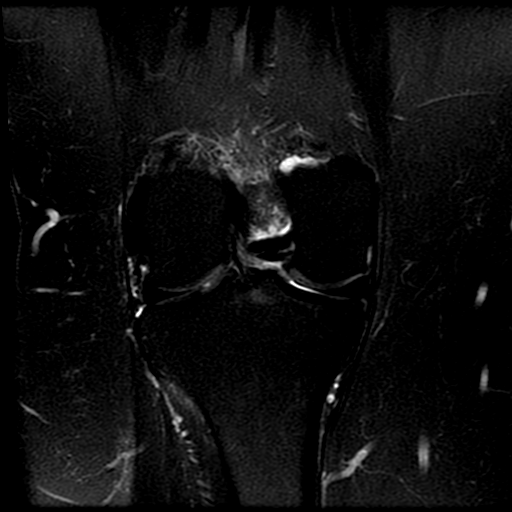
[im 21/21]
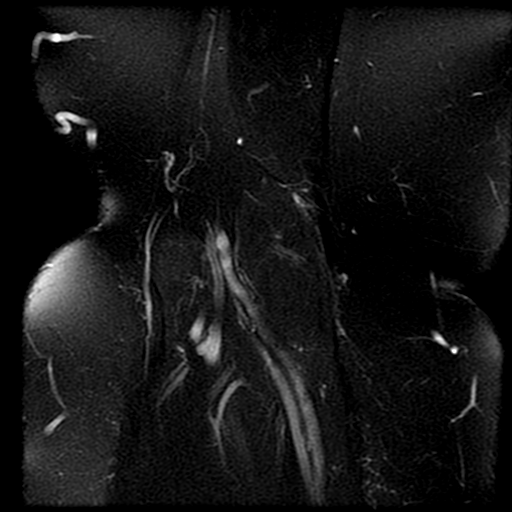

[Series 5: PD fat-sat · coronal · 4.0mm · 0.31mm/px · 6 of 21 slices shown (1 of 2)]
[im 1/21]
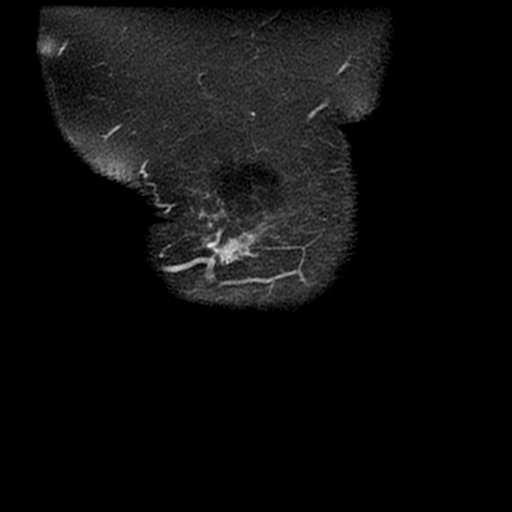
[im 5/21]
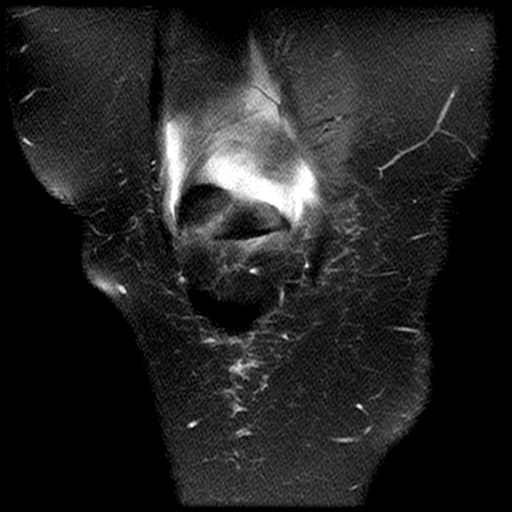
[im 9/21]
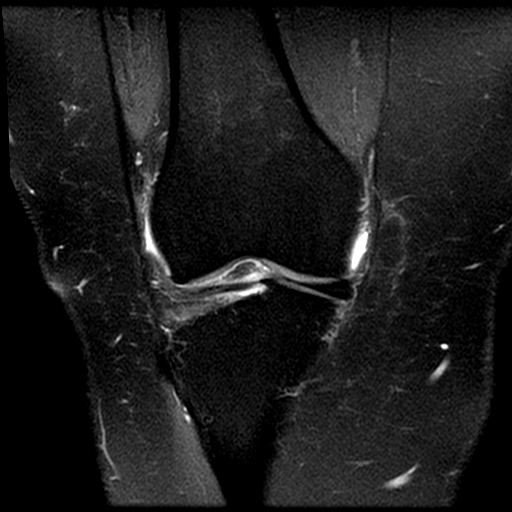
[im 13/21]
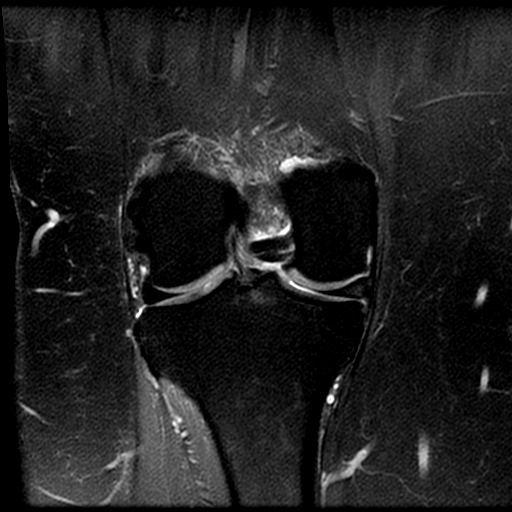
[im 17/21]
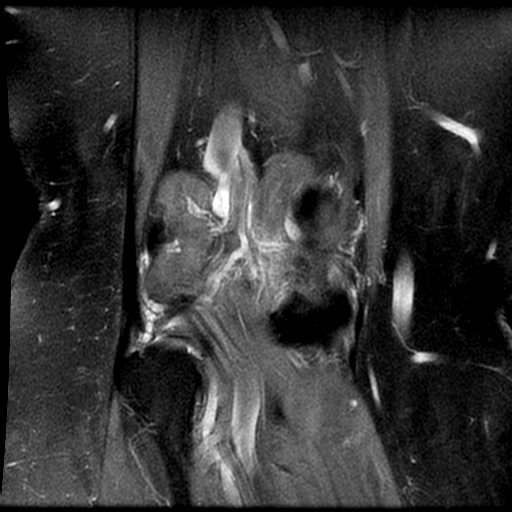
[im 21/21]
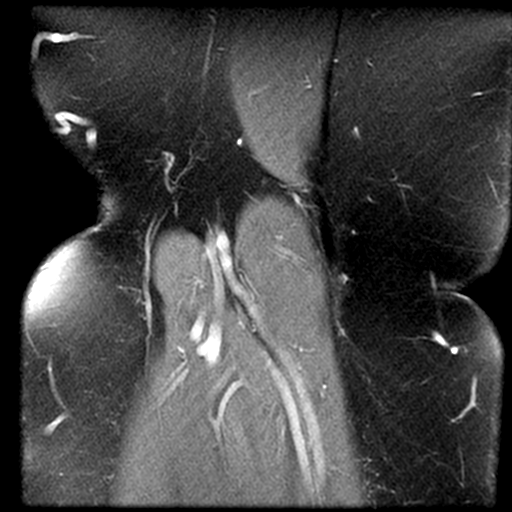

[Series 6: PD fat-sat · sagittal · 3.0mm · 0.31mm/px · 7 of 23 slices shown (2 of 2)]
[im 1/23]
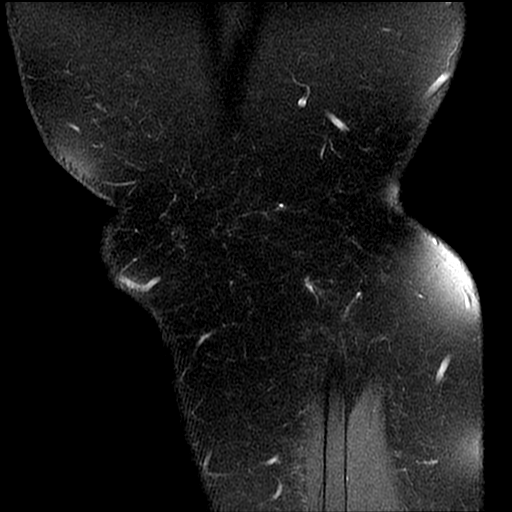
[im 4/23]
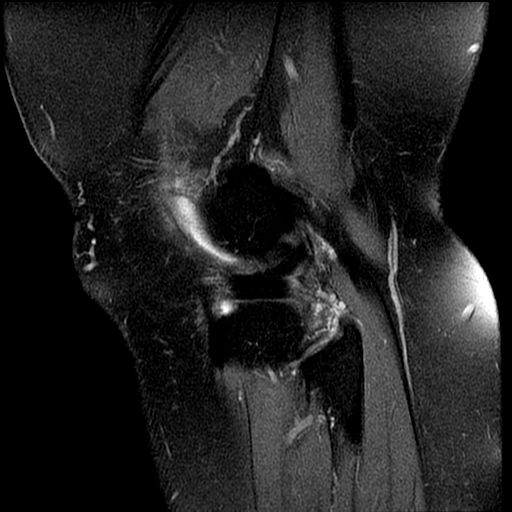
[im 8/23]
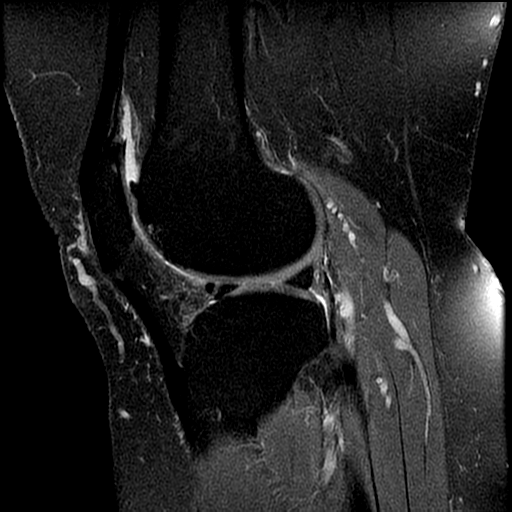
[im 12/23]
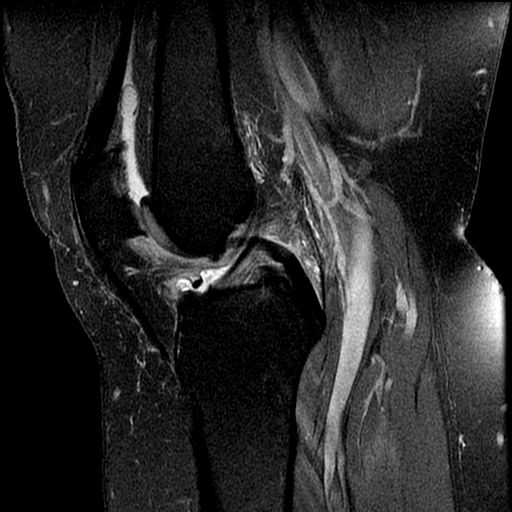
[im 15/23]
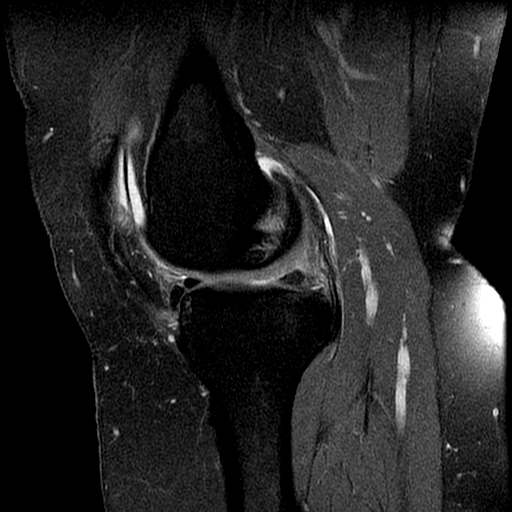
[im 19/23]
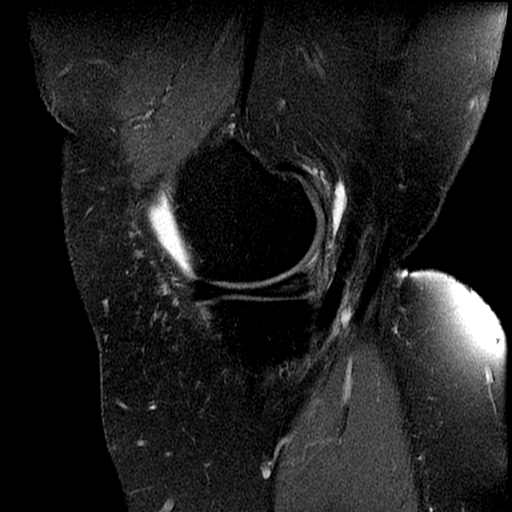
[im 23/23]
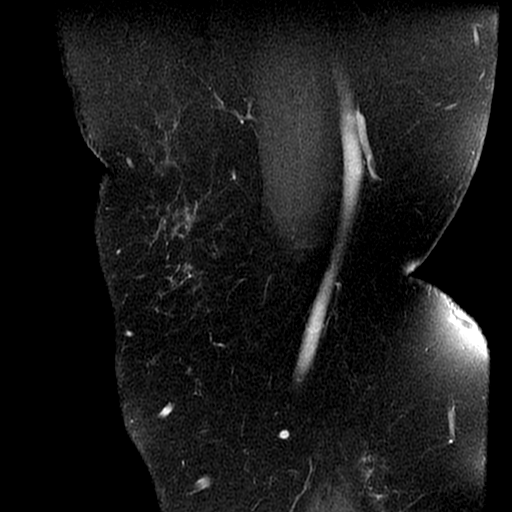

[19 of 40 positions shown; findings below may reference images not displayed]

FINDINGS: MENISCI

Medial meniscus:  Torn posterior root.  No extrusion.

Lateral meniscus:  Intact.

LIGAMENTS

Cruciates:  Intact ACL and PCL.

Collaterals: Medial collateral ligament is intact. Lateral
collateral ligament complex is intact.

CARTILAGE

Patellofemoral: High-grade partial and full-thickness cartilage loss
over the lateral patellar facet and lateral trochlea. Mild
partial-thickness cartilage loss over the patellar apex and medial
facet.

Medial:  No chondral defect.

Lateral: Tiny superficial cartilage defect over the central lateral
tibial plateau.

Joint:  Trace joint effusion.  Mild edema within Hoffa's fat.

Popliteal Fossa:  Tiny Baker cyst.  Intact popliteus tendon.

Extensor Mechanism: Intact quadriceps tendon and patellar tendon.
Intact medial and lateral patellar retinaculum. Intact MPFL.

Bones: No acute fracture or dislocation. No suspicious bone lesion.
Tiny tricompartmental marginal osteophytes.

Other: None.
IMPRESSION: 1. Torn medial meniscus posterior root. No extrusion.
2. Moderate lateral patellofemoral compartment osteoarthritis.

## 2023-11-22 ENCOUNTER — Emergency Department (HOSPITAL_BASED_OUTPATIENT_CLINIC_OR_DEPARTMENT_OTHER)

## 2023-11-22 ENCOUNTER — Inpatient Hospital Stay (HOSPITAL_BASED_OUTPATIENT_CLINIC_OR_DEPARTMENT_OTHER)
Admission: EM | Admit: 2023-11-22 | Discharge: 2023-11-27 | DRG: 336 | Disposition: A | Discharge status: Home / Self Care | Attending: Surgery | Admitting: Surgery

## 2023-11-22 ENCOUNTER — Observation Stay (HOSPITAL_COMMUNITY)

## 2023-11-22 ENCOUNTER — Encounter (HOSPITAL_BASED_OUTPATIENT_CLINIC_OR_DEPARTMENT_OTHER): Payer: Self-pay

## 2023-11-22 ENCOUNTER — Other Ambulatory Visit: Payer: Self-pay

## 2023-11-22 DIAGNOSIS — K56609 Unspecified intestinal obstruction, unspecified as to partial versus complete obstruction: Secondary | ICD-10-CM

## 2023-11-22 DIAGNOSIS — K5651 Intestinal adhesions [bands], with partial obstruction: Principal | ICD-10-CM | POA: Diagnosis present

## 2023-11-22 DIAGNOSIS — K55019 Acute (reversible) ischemia of small intestine, extent unspecified: Secondary | ICD-10-CM | POA: Diagnosis present

## 2023-11-22 DIAGNOSIS — Z6831 Body mass index (BMI) 31.0-31.9, adult: Secondary | ICD-10-CM

## 2023-11-22 DIAGNOSIS — F419 Anxiety disorder, unspecified: Secondary | ICD-10-CM | POA: Diagnosis present

## 2023-11-22 DIAGNOSIS — I10 Essential (primary) hypertension: Secondary | ICD-10-CM | POA: Diagnosis present

## 2023-11-22 DIAGNOSIS — E669 Obesity, unspecified: Secondary | ICD-10-CM | POA: Diagnosis present

## 2023-11-22 DIAGNOSIS — R109 Unspecified abdominal pain: Principal | ICD-10-CM

## 2023-11-22 DIAGNOSIS — K439 Ventral hernia without obstruction or gangrene: Secondary | ICD-10-CM | POA: Diagnosis present

## 2023-11-22 DIAGNOSIS — Z90710 Acquired absence of both cervix and uterus: Secondary | ICD-10-CM

## 2023-11-22 DIAGNOSIS — Z8673 Personal history of transient ischemic attack (TIA), and cerebral infarction without residual deficits: Secondary | ICD-10-CM

## 2023-11-22 DIAGNOSIS — F32A Depression, unspecified: Secondary | ICD-10-CM | POA: Diagnosis present

## 2023-11-22 DIAGNOSIS — Z7902 Long term (current) use of antithrombotics/antiplatelets: Secondary | ICD-10-CM

## 2023-11-22 DIAGNOSIS — Z833 Family history of diabetes mellitus: Secondary | ICD-10-CM

## 2023-11-22 DIAGNOSIS — Z8249 Family history of ischemic heart disease and other diseases of the circulatory system: Secondary | ICD-10-CM

## 2023-11-22 DIAGNOSIS — K565 Intestinal adhesions [bands], unspecified as to partial versus complete obstruction: Secondary | ICD-10-CM | POA: Diagnosis present

## 2023-11-22 DIAGNOSIS — Z8661 Personal history of infections of the central nervous system: Secondary | ICD-10-CM

## 2023-11-22 DIAGNOSIS — N179 Acute kidney failure, unspecified: Secondary | ICD-10-CM | POA: Diagnosis present

## 2023-11-22 DIAGNOSIS — E785 Hyperlipidemia, unspecified: Secondary | ICD-10-CM | POA: Diagnosis present

## 2023-11-22 LAB — CBC
HCT: 36.7 % (ref 36.0–46.0)
Hemoglobin: 12.8 g/dL (ref 12.0–15.0)
MCH: 32.7 pg (ref 26.0–34.0)
MCHC: 34.9 g/dL (ref 30.0–36.0)
MCV: 93.6 fL (ref 80.0–100.0)
Platelets: 168 10*3/uL (ref 150–400)
RBC: 3.92 MIL/uL (ref 3.87–5.11)
RDW: 12 % (ref 11.5–15.5)
WBC: 6.6 10*3/uL (ref 4.0–10.5)
nRBC: 0 % (ref 0.0–0.2)

## 2023-11-22 LAB — URINALYSIS, ROUTINE W REFLEX MICROSCOPIC
Bilirubin Urine: NEGATIVE
Glucose, UA: NEGATIVE mg/dL
Ketones, ur: NEGATIVE mg/dL
Nitrite: POSITIVE — AB
Protein, ur: NEGATIVE mg/dL
Specific Gravity, Urine: >1.046 — ABNORMAL HIGH (ref 1.005–1.030)
pH: 5 (ref 5.0–8.0)

## 2023-11-22 LAB — COMPREHENSIVE METABOLIC PANEL WITH GFR
ALT: 12 U/L (ref 0–44)
AST: 26 U/L (ref 15–41)
Albumin: 4.1 g/dL (ref 3.5–5.0)
Alkaline Phosphatase: 88 U/L (ref 38–126)
Anion gap: 11 (ref 5–15)
BUN: 18 mg/dL (ref 8–23)
CO2: 25 mmol/L (ref 22–32)
Calcium: 9.6 mg/dL (ref 8.9–10.3)
Chloride: 101 mmol/L (ref 98–111)
Creatinine, Ser: 1 mg/dL (ref 0.44–1.00)
GFR, Estimated: >60 mL/min (ref >60)
Glucose, Bld: 122 mg/dL — ABNORMAL HIGH (ref 70–99)
Potassium: 3.7 mmol/L (ref 3.5–5.1)
Sodium: 137 mmol/L (ref 135–145)
Total Bilirubin: 0.5 mg/dL (ref 0.0–1.2)
Total Protein: 6.8 g/dL (ref 6.5–8.1)

## 2023-11-22 LAB — LIPASE, BLOOD: Lipase: 21 U/L (ref 11–51)

## 2023-11-22 MED ORDER — SODIUM CHLORIDE 0.9 % IV SOLN: INTRAVENOUS | Status: DC

## 2023-11-22 MED ORDER — SODIUM CHLORIDE 0.9 % IV BOLUS
1000.0000 mL | Freq: Once | INTRAVENOUS | Status: AC
  Administered 2023-11-22: 1000 mL via INTRAVENOUS

## 2023-11-22 MED ORDER — FENTANYL CITRATE PF 50 MCG/ML IJ SOSY
50.0000 ug | PREFILLED_SYRINGE | Freq: Once | INTRAMUSCULAR | Status: AC
  Administered 2023-11-22: 50 ug via INTRAVENOUS
  Filled 2023-11-22: qty 1

## 2023-11-22 MED ORDER — HYDROMORPHONE HCL 1 MG/ML IJ SOLN
1.0000 mg | Freq: Once | INTRAMUSCULAR | Status: AC
  Administered 2023-11-22: 1 mg via INTRAVENOUS
  Filled 2023-11-22: qty 1

## 2023-11-22 MED ORDER — ACETAMINOPHEN 650 MG RE SUPP
650.0000 mg | Freq: Four times a day (QID) | RECTAL | Status: DC | PRN
  Administered 2023-11-26: 650 mg via RECTAL
  Filled 2023-11-22: qty 1

## 2023-11-22 MED ORDER — ALBUTEROL SULFATE (2.5 MG/3ML) 0.083% IN NEBU: 2.5000 mg | INHALATION_SOLUTION | RESPIRATORY_TRACT | Status: DC | PRN

## 2023-11-22 MED ORDER — ONDANSETRON HCL 4 MG/2ML IJ SOLN
4.0000 mg | Freq: Once | INTRAMUSCULAR | Status: AC
  Administered 2023-11-22: 4 mg via INTRAVENOUS
  Filled 2023-11-22: qty 2

## 2023-11-22 MED ORDER — SODIUM CHLORIDE 0.9 % IV SOLN: INTRAVENOUS | Status: AC

## 2023-11-22 MED ORDER — IOHEXOL 300 MG/ML  SOLN
100.0000 mL | Freq: Once | INTRAMUSCULAR | Status: AC | PRN
  Administered 2023-11-22: 100 mL via INTRAVENOUS

## 2023-11-22 MED ORDER — HYDRALAZINE HCL 20 MG/ML IJ SOLN
5.0000 mg | Freq: Four times a day (QID) | INTRAMUSCULAR | Status: DC | PRN
  Administered 2023-11-24: 5 mg via INTRAVENOUS
  Filled 2023-11-22: qty 1

## 2023-11-22 MED ORDER — MORPHINE SULFATE (PF) 4 MG/ML IV SOLN
4.0000 mg | Freq: Once | INTRAVENOUS | Status: AC
  Administered 2023-11-22: 4 mg via INTRAVENOUS
  Filled 2023-11-22: qty 1

## 2023-11-22 MED ORDER — SODIUM CHLORIDE 0.9 % IV SOLN
12.5000 mg | Freq: Four times a day (QID) | INTRAVENOUS | Status: DC | PRN
  Administered 2023-11-22 – 2023-11-23 (×5): 12.5 mg via INTRAVENOUS
  Filled 2023-11-22 (×5): qty 12.5

## 2023-11-22 MED ORDER — ONDANSETRON HCL 4 MG/2ML IJ SOLN
4.0000 mg | Freq: Four times a day (QID) | INTRAMUSCULAR | Status: DC | PRN
  Administered 2023-11-22 (×2): 4 mg via INTRAVENOUS
  Filled 2023-11-22 (×2): qty 2

## 2023-11-22 MED ORDER — ACETAMINOPHEN 325 MG PO TABS
650.0000 mg | ORAL_TABLET | Freq: Four times a day (QID) | ORAL | Status: DC | PRN
  Administered 2023-11-23 – 2023-11-24 (×2): 650 mg via ORAL
  Filled 2023-11-22 (×2): qty 2

## 2023-11-22 MED ORDER — ENOXAPARIN SODIUM 40 MG/0.4ML IJ SOSY
40.0000 mg | PREFILLED_SYRINGE | INTRAMUSCULAR | Status: DC
  Administered 2023-11-22 – 2023-11-26 (×4): 40 mg via SUBCUTANEOUS
  Filled 2023-11-22 (×4): qty 0.4

## 2023-11-22 MED ORDER — OXYCODONE HCL 5 MG PO TABS: 5.0000 mg | ORAL_TABLET | ORAL | Status: DC | PRN

## 2023-11-22 MED ORDER — MORPHINE SULFATE (PF) 2 MG/ML IV SOLN
2.0000 mg | INTRAVENOUS | Status: DC | PRN
  Administered 2023-11-22 – 2023-11-23 (×5): 2 mg via INTRAVENOUS
  Filled 2023-11-22 (×6): qty 1

## 2023-11-22 MED ORDER — DIATRIZOATE MEGLUMINE & SODIUM 66-10 % PO SOLN
90.0000 mL | Freq: Once | ORAL | Status: AC
  Administered 2023-11-22: 90 mL via ORAL
  Filled 2023-11-22: qty 90

## 2023-11-22 NOTE — ED Notes (Signed)
 Report given to [facility].

## 2023-11-22 NOTE — Consult Note (Addendum)
 Gracen Bedient Coudriet [birth year]  [id number].    Requesting MD: Jannette Mend, MD Chief Complaint/Reason for Consult: SBO  HPI:  Maymuna B. Nero is a 68 y/o F with PMH HTN, HLD, obesity, MDD/anxiety, and CVA in 2016 on Plavix  who presents with abdominal pain. She reports abdominal pain and mild nausea that started Wednesday afternoon. This pain was off and on, associated with poor PO intake, and she saw her PCP Friday who ordered a CT scan. The pain became very severe Sunday night and she had associated vomiting so [facility]. She does report similar, episodic pain, over the last few months that usually resolves on its own in about half a day. This pain is more severe and did not resolve. Other associated symptoms include feeling hot/cold as well as cold-like symptoms with sinus congestion and throat irritation. She reports belching along with flatus. States her last BM was yesterday and was normal/non-bloody. She deneis a known history of bowel obstruction. She states she is on optavia for weight loss but denies any injections/meds at present for weight loss.   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 Her husband is at the bedside.  Blood thinners: plavix , last reported dose 4/27   ROS: Review of Systems  All other systems reviewed and are negative.   Family History  Problem Relation Age of Onset   CAD Father    Diabetes Maternal Grandmother    Hypertension Other     Past Medical History:  Diagnosis Date   Encephalitis    Hypertension    Stroke [facility])    TIA (transient ischemic attack) 09/27/2014    Past Surgical History:  Procedure Laterality Date   ABDOMINAL HYSTERECTOMY     LOOP RECORDER IMPLANT N/A 10/04/2014   Procedure: LOOP RECORDER IMPLANT;  Surgeon: Jolly Needle, MD;   Location: [facility] CATH LAB;  Service: Cardiovascular;  Laterality: N/A;   TEE WITHOUT CARDIOVERSION N/A 10/04/2014   Procedure: TRANSESOPHAGEAL ECHOCARDIOGRAM (TEE);  Surgeon: Darlis Eisenmenger, MD;  Location: [facility] ENDOSCOPY;  Service: Cardiovascular;  Laterality: N/A;    Social History:  reports that she has never smoked. She has never used smokeless tobacco. She reports that she does not drink alcohol and does not use drugs.  Allergies:  Allergies  Allergen Reactions   Valsartan Shortness Of Breath    Other reaction(s): Cough ACE cough. Losartan  inadequate BP management, but tolerated. Tolerates olmesartan.   Codeine Other (See Comments)    Husband & patient unaware of reaction.    Sulfa Antibiotics    Ace Inhibitors Cough    Medications Prior to Admission  Medication Sig Dispense Refill   atorvastatin  (LIPITOR) 20 MG tablet TAKE 1 TABLET EVERY DAY AT 6 PM 90 tablet 3   buPROPion  (WELLBUTRIN  XL) 150 MG 24 hr tablet Take 1 tablet (150 mg total) by mouth daily. 90 tablet 3   Cholecalciferol (VITAMIN D ) 2000 UNITS tablet Take 2,000 Units by mouth daily.     clopidogrel  (PLAVIX ) 75 MG tablet Take 1 tablet (75 mg total) by mouth daily. 90 tablet 3   olmesartan-hydrochlorothiazide  (BENICAR HCT) 40-12.5 MG tablet olmesartan 40 mg-hydrochlorothiazide  12.5 mg tablet     sertraline  (ZOLOFT ) 100 MG tablet Take 1 tablet (100 mg total) by mouth daily. 90 tablet 3     Physical Exam: Blood pressure  130/61, pulse (!) 54, temperature 97.8 F (36.6 C), resp. rate 18, height 5\' 3"  (1.6 m), weight 79.6 kg, SpO2 95%. General: Pleasant white female laying on hospital bed, appears stated age, NAD. HEENT: head -normocephalic, atraumatic; Eyes: PERRLA, no conjunctival injection, anicteric sclerae Neck- Trachea is midline CV- RRR, normal S1/S2, no M/R/G, no lower extremity edema  Pulm- breathing is non-labored on room air Abd- soft, non-distended, mild TTP RLQ without rebound tenderness or guarding, no palpable  hernias (though there is an abdominal wall hernia on CT containing fat). No palpable masses.  GU- deferred  MSK- UE/LE symmetrical, no cyanosis, clubbing, or edema. Neuro- non-focal exam, no paresthesias. Psych- Alert and Oriented x3 with appropriate affect Skin: warm and dry, no rashes or lesions   Results for orders placed or performed during the hospital encounter of 11/22/23 (from the past 48 hours)  Lipase, blood     Status: None   Collection Time: 11/22/23  1:10 AM  Result Value Ref Range   Lipase 21 11 - 51 U/L    Comment: Performed at [facility], [address], [place], [place] [address]  Comprehensive metabolic panel     Status: Abnormal   Collection Time: 11/22/23  1:10 AM  Result Value Ref Range   Sodium 137 135 - 145 mmol/L   Potassium 3.7 3.5 - 5.1 mmol/L   Chloride 101 98 - 111 mmol/L   CO2 25 22 - 32 mmol/L   Glucose, Bld 122 (H) 70 - 99 mg/dL    Comment: Glucose reference range applies only to samples taken after fasting for at least 8 hours.   BUN 18 8 - 23 mg/dL   Creatinine, Ser 1.91 0.44 - 1.00 mg/dL   Calcium  9.6 8.9 - 10.3 mg/dL   Total Protein 6.8 6.5 - 8.1 g/dL   Albumin 4.1 3.5 - 5.0 g/dL   AST 26 15 - 41 U/L   ALT 12 0 - 44 U/L   Alkaline Phosphatase 88 38 - 126 U/L   Total Bilirubin 0.5 0.0 - 1.2 mg/dL   GFR, Estimated >47 >82 mL/min    Comment: (NOTE) Calculated using the CKD-EPI Creatinine Equation (2021)    Anion gap 11 5 - 15    Comment: Performed at [facility], [address], [place], [place] [address]  CBC     Status: None   Collection Time: 11/22/23  1:10 AM  Result Value Ref Range   WBC 6.6 4.0 - 10.5 K/uL   RBC 3.92 3.87 - 5.11 MIL/uL   Hemoglobin 12.8 12.0 - 15.0 g/dL   HCT 30.8 65.7 - 84.6 %   MCV 93.6 80.0 - 100.0 fL   MCH 32.7 26.0 - 34.0 pg   MCHC 34.9 30.0 - 36.0 g/dL   RDW 96.2 95.2 - 84.1 %   Platelets 168 150 - 400 K/uL   nRBC 0.0 0.0 - 0.2 %    Comment: Performed at [facility], [address] [place], [place] [address]  Urinalysis, Routine w reflex microscopic -Urine, Clean Catch     Status: Abnormal   Collection Time: 11/22/23 10:55 AM  Result Value Ref Range   Color, Urine YELLOW YELLOW   APPearance CLEAR CLEAR   Specific Gravity, Urine >1.046 (H) 1.005 - 1.030   pH 5.0 5.0 - 8.0   Glucose, UA NEGATIVE NEGATIVE mg/dL   Hgb urine dipstick MODERATE (A) NEGATIVE   Bilirubin Urine NEGATIVE NEGATIVE   Ketones, ur NEGATIVE NEGATIVE mg/dL  Protein, ur NEGATIVE NEGATIVE mg/dL   Nitrite POSITIVE (A) NEGATIVE   Leukocytes,Ua SMALL (A) NEGATIVE   RBC / HPF 6-10 0 - 5 RBC/hpf   WBC, UA 6-10 0 - 5 WBC/hpf   Bacteria, UA FEW (A) NONE SEEN   Squamous Epithelial / HPF 0-5 0 - 5 /HPF   WBC Clumps PRESENT     Comment: Performed at [facility], [address], [place], [place] [address]   CT ABDOMEN PELVIS W CONTRAST Result Date: 11/22/2023 EXAM: CT ABDOMEN AND PELVIS WITH CONTRAST 11/22/2023 02:28:35 AM TECHNIQUE: CT of the abdomen and pelvis was performed with intravenous contrast. Multiplanar reformatted images are provided for review. Automated exposure control, iterative reconstruction, and/or weight based adjustment of the mA/kV was utilized to reduce the radiation dose to as low as reasonably achievable. COMPARISON: None available. CLINICAL HISTORY: Abdominal pain, acute, nonlocalized; right sided. Pt BIB [facility] from home with RUQ abdominal pain x 2 weeks with worsening x 2-3 hours. Pt has nausea and vomiting. Pt was given Zofran  4 mg and 500mL LR PTA. Pt is actively vomiting upon arrival. FINDINGS: LOWER CHEST: Dependent atelectasis at the lung bases. HEPATOBILIARY: Liver is unremarkable. Status post cholecystectomy. SPLEEN: No acute abnormality. PANCREAS: No acute abnormality. ADRENAL GLANDS: No acute abnormality. KIDNEYS, URETERS: No stones in the kidneys or ureters. No evidence of hydronephrosis. No evidence of perinephric or  periureteral stranding. GI AND BOWEL: Dilated small bowel in the central abdomen, suggesting at least partial small bowel obstruction, likely on the basis of adhesions (image 59). Appendix is not clearly visualized. PELVIS: Trace pelvic ascites. PERITONEUM AND RETROPERITONEUM: No evidence of free air. LYMPH NODES: No evidence of lymphadenopathy. BONES AND SOFT TISSUES: Small left paramidline lower abdominal wall fat-containing hernia with mild stranding (image 77). No acute osseous abnormality. Incidental adrenal and/or renal findings do not require follow up imaging. IMPRESSION: 1. Suspected partial small bowel obstruction, likely on the basis of adhesions. 2. Small left paramidline lower abdominal wall fat-containing hernia with mild stranding. Electronically signed by: Zadie Herter MD 11/22/2023 02:34 AM EDT RP Workstation: [id number]      Assessment/Plan pSBO 68 y/o F who presents with 5 days of abdominal pain that acutely worsened 4/28.  - afebrile, hemodynamically stable, WBC 6.6  - mild RLQ abdominal soreness on exam, no peritonitis  - CT scan of the abdomen and pelvis 4/28 shows partial small bowel obstruction as well as a fat containing abdominal wall hernia. Her hernia does not appear to be the point of her bowel obstruction. I was not able to palpate this hernia during my exam.  - clinically she is partially obstructed. Attempt non-operative management. Will PO challenge with oral gastrografin and serial x-rays. If she has worsening abdominal distention or vomiting she will need an NG tube for gastric decompression. Failure to improve, or clinical worsening, would likely warrant diagnostic laparoscopy vs exploratory laparotomy. [facility] will follow closely. Please continue to hold plavix . - I forgot to ask her about her colorectal history, I do not see a colonoscopy on file. Will need to follow up on this for screening purposes. I see a negative cologuard test from 03/2022.   FEN - NPO, ice  chips/sips with meds ok VTE - SCD's, lovenox  ID - none indicated Admit - [facility] service  Below per TRH - Hx CVA - pt denies deficits from this, does report issues with speech when she has severe anxiety; please hold plavix  HTN - on Benicar at home, ok for sips  w/ meds here HLD - on statin tx Anxiety/depression - wellbutrin  XL 150 mg at home  I reviewed nursing notes, ED provider notes, hospitalist notes, last 24 h vitals and pain scores, last 48 h intake and output, last 24 h labs and trends, and last 24 h imaging results.  Charlott Converse, [facility] 11/22/2023, 11:48 AM Please see Amion for pager number during day hours 7:00am-4:30pm or 7:00am -11:30am on weekends

## 2023-11-22 NOTE — ED Notes (Signed)
 [facility] called for hospitalist consult.

## 2023-11-22 NOTE — Progress Notes (Signed)
 Plan of Care Note for accepted transfer   Patient: Shawna Waters MRN: [id number]   DOA: 11/22/2023  Facility requesting transfer: [facility] Requesting Provider: Gwenetta Lennert, MD Reason for transfer: Partial small bowel obstruction Facility course: Shawna Waters is a 67 y.o. year-old female with a history of hypertension, stroke presenting to the ED with chief complaint of right-sided abdominal pain over the past 2 weeks, getting worse and now associated with persistent nausea vomiting this evening.  Normal bowel movements.  No fever.  No chest pain or shortness of breath.  Upon arrival to the ER, BP was 155/88 with otherwise normal vital signs.  CMP was within normal as well as CBC.  Abdominal and pelvic CT scan with contrast revealed the following: 1. Suspected partial small bowel obstruction, likely on the basis of adhesions. 2. Small left paramidline lower abdominal wall fat-containing hernia with mild stranding.  The patient was given 50 mcg of IV fentanyl  and 1 mg of IV Dilaudid, 4 mg of IV Zofran  1 L bolus of IV normal saline.  Dr. Camilo Cella with general surgery was contacted and is aware about the patient.  She will be admitted to a medical-surgical bed at [facility] for further evaluation and management. Plan of care: The patient is accepted for admission to Med-surg  unit, at [facility]..  The patient will be under the care of an responsibility of the EDP until arrival to [facility]  Author: Virgene Griffin, MD 11/22/2023  Check [email or web address] for on-call coverage.  Nursing staff, Please call [facility] Admits & Consults System-Wide number on Amion as soon as patient's arrival, so appropriate admitting provider can evaluate the pt.

## 2023-11-22 NOTE — Care Management Obs Status (Signed)
 MEDICARE OBSERVATION STATUS NOTIFICATION   Patient Details  Name: EMERAUDE MENGER MRN: [id number] Date of Birth: May 02, 1956   Medicare Observation Status Notification Given:  Yes    Kathryn Parish, RN 11/22/2023, 12:32 PM

## 2023-11-22 NOTE — TOC Initial Note (Signed)
 Transition of Care [facility]) - Initial/Assessment Note    Patient Details  Name: Shawna Waters MRN: [id number] Date of Birth: March 04, 1956  Transition of Care [facility]) CM/SW Contact:    Kathryn Parish, RN Phone Number: 11/22/2023, 12:36 PM  Clinical Narrative:                 MOON. Home with spouse Sharin David). PCP and insurance verified. No DME, HH, oxygen or SDOH. Transportation at discharge will be spouse via private vehicle. TOC signing off.  Expected Discharge Plan: Home/Self Care Barriers to Discharge: No Barriers Identified   Patient Goals and CMS Choice     Choice offered to / list presented to : NA      Expected Discharge Plan and Services In-house Referral: NA Discharge Planning Services: NA                     DME Arranged: N/A DME Agency: NA         HH Agency: NA        Prior Living Arrangements/Services     Patient language and need for interpreter reviewed:: Yes Do you feel safe going back to the place where you live?: Yes      Need for Family Participation in Patient Care: No (Comment) Care giver support system in place?: Yes (comment)   Criminal Activity/Legal Involvement Pertinent to Current Situation/Hospitalization: No - Comment as needed  Activities of Daily Living   ADL Screening (condition at time of admission) Independently performs ADLs?: Yes (appropriate for developmental age) Is the patient deaf or have difficulty hearing?: No Does the patient have difficulty seeing, even when wearing glasses/contacts?: No Does the patient have difficulty concentrating, remembering, or making decisions?: No  Permission Sought/Granted Permission sought to share information with : Case Manager Permission granted to share information with : Yes, Verbal Permission Granted              Emotional Assessment Appearance:: Appears stated age Attitude/Demeanor/Rapport: Engaged Affect (typically observed): Appropriate Orientation: : Oriented to Self,  Oriented to Place, Oriented to  Time, Oriented to Situation Alcohol / Substance Use: Not Applicable Psych Involvement: No (comment)  Admission diagnosis:  Small bowel obstruction due to adhesions (HCC) [K56.50] Abdominal pain, unspecified abdominal location [R10.9] Patient Active Problem List   Diagnosis Date Noted   Small bowel obstruction due to adhesions (HCC) 11/22/2023   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   Vitamin D  insufficiency 10/20/2016   Depression 02/04/2015   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 02/04/2015   History of TIA (transient ischemic attack) and stroke 10/03/2014   Hyperlipidemia LDL goal <100 10/03/2014   HTN (hypertension) 09/27/2014   Hyperglycemia    BMI 37.0-37.9, adult    PCP:  Patient, No Pcp Per Pharmacy:   [id number] - [place], [place] - [address] AT [facility] [address] [place] [address] Phone: [phone] Fax: [phone]  [id number] [place], [place] - [address] AT [facility] OF [address] [place] [address] Phone: [phone] Fax: [phone]     Social Drivers of Health (SDOH) Social History: SDOH Screenings   Food Insecurity: No Food Insecurity (11/22/2023)  Housing: Low Risk  (11/22/2023)  Transportation Needs: No Transportation Needs (11/22/2023)  Utilities: Not At Risk (11/22/2023)  Financial Resource Strain: Low Risk  (11/19/2023)   Received from [facility]  Physical  Activity: Insufficiently Active (05/25/2023)   Received from [facility]  Social Connections: Unknown (11/22/2023)  Stress: No Stress Concern Present (05/25/2023)   Received from [facility]  Tobacco Use: Low Risk  (11/22/2023)   SDOH Interventions:     Readmission Risk Interventions     No data to display

## 2023-11-22 NOTE — ED Notes (Signed)
 Report given to the floor RN.

## 2023-11-22 NOTE — ED Triage Notes (Signed)
 Pt BIB [facility] from home with RUQ abdominal pain x 2 weeks with worsening x 2-3 hours. Pt has nausea and vomiting . Pt was given Zofran  4 mg and 500mL LR PTA. Pt is actively vomiting upon arrival.

## 2023-11-22 NOTE — H&P (Signed)
 History and Physical  Shawna Waters MVH:[id number] DOB: 07/04/1956 DOA: 11/22/2023  PCP: Patient, No Pcp Per   Chief Complaint: Abdominal pain, vomiting  HPI: Shawna Waters is a 68 y.o. female with medical history significant for hypertension, depression, hysterectomy and cesarean section being admitted to the hospital with partial small bowel obstruction.  History is provided by the patient, who states that she has been having some intermittent vague abdominal discomfort with mild nausea and no vomiting for the last couple of weeks.  She has been having normal bowel movements, last night she had sudden onset of severe abdominal pain, with dry heaving.  After the pain started actually, she has not had 1 or 2 normal bowel movements.  However since that time and since she has been in the ER, she has not passed any gas and not had any bowel movements.  Workup as noted below shows evidence of suspected partial small bowel obstruction, suspected due to adhesions.  Review of Systems: Please see HPI for pertinent positives and negatives. A complete 10 system review of systems are otherwise negative.  Past Medical History:  Diagnosis Date   Encephalitis    Hypertension    Stroke [facility])    TIA (transient ischemic attack) 09/27/2014   Past Surgical History:  Procedure Laterality Date   ABDOMINAL HYSTERECTOMY     LOOP RECORDER IMPLANT N/A 10/04/2014   Procedure: LOOP RECORDER IMPLANT;  Surgeon: Jolly Needle, MD;  Location: [facility] CATH LAB;  Service: Cardiovascular;  Laterality: N/A;   TEE WITHOUT CARDIOVERSION N/A 10/04/2014   Procedure: TRANSESOPHAGEAL ECHOCARDIOGRAM (TEE);  Surgeon: Darlis Eisenmenger, MD;  Location: [facility] ENDOSCOPY;  Service: Cardiovascular;  Laterality: N/A;   Social History:  reports that she has never smoked. She has never used smokeless tobacco. She reports that she does not drink alcohol and does not use drugs.  Allergies  Allergen Reactions   Valsartan Shortness Of Breath    Other  reaction(s): Cough ACE cough. Losartan  inadequate BP management, but tolerated. Tolerates olmesartan.   Codeine Other (See Comments)    Husband & patient unaware of reaction.    Sulfa Antibiotics    Ace Inhibitors Cough    Family History  Problem Relation Age of Onset   CAD Father    Diabetes Maternal Grandmother    Hypertension Other      Prior to Admission medications   Medication Sig Start Date End Date Taking? Authorizing Provider  atorvastatin  (LIPITOR) 20 MG tablet TAKE 1 TABLET EVERY DAY AT 6 PM 10/29/17   Aldine Humphreys, PA  buPROPion  (WELLBUTRIN  XL) 150 MG 24 hr tablet Take 1 tablet (150 mg total) by mouth daily. 01/05/18   Aldine Humphreys, PA  Cholecalciferol (VITAMIN D ) 2000 UNITS tablet Take 2,000 Units by mouth daily.    [provider]  clopidogrel  (PLAVIX ) 75 MG tablet Take 1 tablet (75 mg total) by mouth daily. 04/27/17   Aldine Humphreys, PA  olmesartan-hydrochlorothiazide  (BENICAR HCT) 40-12.5 MG tablet olmesartan 40 mg-hydrochlorothiazide  12.5 mg tablet 06/27/21   [provider]  sertraline  (ZOLOFT ) 100 MG tablet Take 1 tablet (100 mg total) by mouth daily. 04/27/17   Aldine Humphreys, PA    Physical Exam: BP 130/61 (BP Location: Right Arm)   Pulse (!) 54   Temp 97.8 F (36.6 C)   Resp 18   Ht 5\' 3"  (1.6 m)   Wt 79.6 kg   SpO2 95%   BMI 31.09 kg/m  General:  Alert, oriented, calm, in no acute distress,  her husband is at the bedside Eyes: EOMI, clear conjuctivae, white sclerea Neck: supple, no masses, trachea mildline  Cardiovascular: RRR, no murmurs or rubs, no peripheral edema  Respiratory: clear to auscultation bilaterally, no wheezes, no crackles  Abdomen: soft, tender especially in the right upper quadrant, minimally distended, normal bowel tones heard  Skin: dry, no rashes  Musculoskeletal: no joint effusions, normal range of motion  Psychiatric: appropriate affect, normal speech  Neurologic: extraocular muscles intact, clear speech,  moving all extremities with intact sensorium         Labs on Admission:  Basic Metabolic Panel: Recent Labs  Lab 11/22/23 0110  NA 137  K 3.7  CL 101  CO2 25  GLUCOSE 122*  BUN 18  CREATININE 1.00  CALCIUM  9.6   Liver Function Tests: Recent Labs  Lab 11/22/23 0110  AST 26  ALT 12  ALKPHOS 88  BILITOT 0.5  PROT 6.8  ALBUMIN 4.1   Recent Labs  Lab 11/22/23 0110  LIPASE 21   No results for input(s): "AMMONIA" in the last 168 hours. CBC: Recent Labs  Lab 11/22/23 0110  WBC 6.6  HGB 12.8  HCT 36.7  MCV 93.6  PLT 168   Cardiac Enzymes: No results for input(s): "CKTOTAL", "CKMB", "CKMBINDEX", "TROPONINI" in the last 168 hours. BNP (last 3 results) No results for input(s): "BNP" in the last 8760 hours.  ProBNP (last 3 results) No results for input(s): "PROBNP" in the last 8760 hours.  CBG: No results for input(s): "GLUCAP" in the last 168 hours.  Radiological Exams on Admission: CT ABDOMEN PELVIS W CONTRAST Result Date: 11/22/2023 EXAM: CT ABDOMEN AND PELVIS WITH CONTRAST 11/22/2023 02:28:35 AM TECHNIQUE: CT of the abdomen and pelvis was performed with intravenous contrast. Multiplanar reformatted images are provided for review. Automated exposure control, iterative reconstruction, and/or weight based adjustment of the mA/kV was utilized to reduce the radiation dose to as low as reasonably achievable. COMPARISON: None available. CLINICAL HISTORY: Abdominal pain, acute, nonlocalized; right sided. Pt BIB [facility] from home with RUQ abdominal pain x 2 weeks with worsening x 2-3 hours. Pt has nausea and vomiting. Pt was given Zofran  4 mg and 500mL LR PTA. Pt is actively vomiting upon arrival. FINDINGS: LOWER CHEST: Dependent atelectasis at the lung bases. HEPATOBILIARY: Liver is unremarkable. Status post cholecystectomy. SPLEEN: No acute abnormality. PANCREAS: No acute abnormality. ADRENAL GLANDS: No acute abnormality. KIDNEYS, URETERS: No stones in the kidneys or ureters.  No evidence of hydronephrosis. No evidence of perinephric or periureteral stranding. GI AND BOWEL: Dilated small bowel in the central abdomen, suggesting at least partial small bowel obstruction, likely on the basis of adhesions (image 59). Appendix is not clearly visualized. PELVIS: Trace pelvic ascites. PERITONEUM AND RETROPERITONEUM: No evidence of free air. LYMPH NODES: No evidence of lymphadenopathy. BONES AND SOFT TISSUES: Small left paramidline lower abdominal wall fat-containing hernia with mild stranding (image 77). No acute osseous abnormality. Incidental adrenal and/or renal findings do not require follow up imaging. IMPRESSION: 1. Suspected partial small bowel obstruction, likely on the basis of adhesions. 2. Small left paramidline lower abdominal wall fat-containing hernia with mild stranding. Electronically signed by: Zadie Herter MD 11/22/2023 02:34 AM EDT RP Workstation: [id number]   Assessment/Plan Shawna Waters is a 68 y.o. female with medical history significant for hypertension, depression, hysterectomy and cesarean section being admitted to the hospital with partial small bowel obstruction.   Partial small bowel obstruction-suspected based on CT imaging as above, as well as abdominal pain, distention,  and vomiting early this morning.  Currently the patient is comfortable with only minimal nausea, NG tube not in place. -Observation admission -IV fluids -N.p.o. -Pain and nausea medication as needed -Would recommend NG tube placement in case of severe nausea or vomiting -ER provider consulted Dr. Camilo Cella of general surgery overnight, I have requested nursing staff to notify on-call surgical team of the patient's arrival  Hypertension-IV hydralazine  as needed.  Home medications can be resumed once patient is no longer n.p.o.  DVT prophylaxis: Lovenox      Code Status: Full Code  Consults called: General Surgery Dr. Camilo Cella  Admission status: Observation  Time spent: 46  minutes  Lilias Lorensen Rickey Charm MD [facility] Pager [phone]  If 7PM-7AM, please contact night-coverage [email or web address] Password [email or web address]  11/22/2023, 11:11 AM

## 2023-11-22 NOTE — ED Provider Notes (Signed)
 [facility]GENCY [facility] Emergency Department Provider Note MRN:  [id number]  Arrival date & time: 11/22/23     Chief Complaint   Abdominal Pain   History of Present Illness   Shawna Waters is a 68 y.o. year-old female with a history of hypertension, stroke presenting to the ED with chief complaint of abdominal pain.  Right-sided abdominal pain over the past 2 weeks.  Getting worse and now associated with persistent nausea vomiting this evening.  Normal bowel movements.  No fever.  No chest pain or shortness of breath.  Review of Systems  A thorough review of systems was obtained and all systems are negative except as noted in the HPI and PMH.   Patient's Health History    Past Medical History:  Diagnosis Date   Encephalitis    Hypertension    Stroke [facility])    TIA (transient ischemic attack) 09/27/2014    Past Surgical History:  Procedure Laterality Date   ABDOMINAL HYSTERECTOMY     LOOP RECORDER IMPLANT N/A 10/04/2014   Procedure: LOOP RECORDER IMPLANT;  Surgeon: Jolly Needle, MD;  Location: [facility] CATH LAB;  Service: Cardiovascular;  Laterality: N/A;   TEE WITHOUT CARDIOVERSION N/A 10/04/2014   Procedure: TRANSESOPHAGEAL ECHOCARDIOGRAM (TEE);  Surgeon: Darlis Eisenmenger, MD;  Location: [facility] ENDOSCOPY;  Service: Cardiovascular;  Laterality: N/A;    Family History  Problem Relation Age of Onset   CAD Father    Diabetes Maternal Grandmother    Hypertension Other     Social History   Socioeconomic History   Marital status: Married    Spouse name: RIchard N. 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 Adult son lives with them.   Takes care of her 36 year old parents   Right handed.   Caffeine use: none   Social Drivers of Corporate investment banker Strain: Low Risk  (11/19/2023)   Received from [facility]   Overall Financial Resource Strain (CARDIA)    Difficulty of Paying Living Expenses: Not hard at all  Food Insecurity: No Food Insecurity (11/19/2023)   Received from [facility]   Hunger Vital Sign    Worried About Running Out of Food in the Last Year: Never true    Ran Out of Food in the Last Year: Never true  Transportation Needs: No Transportation Needs (11/19/2023)   Received from [facility] - Transportation    Lack of Transportation (Medical): No    Lack of Transportation (Non-Medical): No  Physical Activity: Insufficiently Active (05/25/2023)   Received from [facility]   Exercise Vital Sign    Days of Exercise per Week: 2 days    Minutes of Exercise per Session: 20 min  Stress: No Stress Concern Present (05/25/2023)   Received from [facility] -  Occupational Stress Questionnaire    Feeling of Stress : Not at all  Social Connections: Socially Integrated (05/25/2023)   Received from [facility]   Social Network    How would you rate your social network (family, work, friends)?: Good participation with social networks  Intimate Partner Violence: Not At Risk (05/25/2023)   Received from [facility]   HITS    Over the last 12 months how often did your partner physically hurt you?: Never    Over the last 12 months how often did your partner insult you or talk down to you?: Never    Over the last 12 months how often did your partner threaten you with physical harm?: Never    Over the last 12 months how often did your  partner scream or curse at you?: Never     Physical Exam   Vitals:   11/22/23 0059 11/22/23 0330  BP: (!) 155/88 (!) 123/50  Pulse: 89 (!) 55  Resp: 15   Temp: 98.3 F (36.8 C)   SpO2: 95% 98%    CONSTITUTIONAL: ill appearing, actively retching NEURO/PSYCH:  Alert and oriented x 3, no focal deficits EYES:  eyes equal and reactive ENT/NECK:  no LAD, no JVD CARDIO: Regular rate, well-perfused, normal S1 and S2 PULM:  CTAB no wheezing or rhonchi GI/GU:  non-distended, non-tender MSK/SPINE:  No gross deformities, no edema SKIN:  no rash, atraumatic   *Additional and/or pertinent findings included in MDM below  Diagnostic and Interventional Summary    EKG Interpretation Date/Time:    Ventricular Rate:    PR Interval:    QRS Duration:    QT Interval:    QTC Calculation:   R Axis:      Text Interpretation:         Labs Reviewed  COMPREHENSIVE METABOLIC PANEL WITH GFR - Abnormal; Notable for the following components:      Result Value   Glucose, Bld 122 (*)    All other components within normal limits  LIPASE, BLOOD  CBC  URINALYSIS, ROUTINE W REFLEX MICROSCOPIC    CT ABDOMEN PELVIS W CONTRAST  Final Result      Medications  sodium chloride  0.9 % bolus 1,000 mL (0 mLs Intravenous Stopped 11/22/23 0259)  ondansetron  (ZOFRAN ) injection 4 mg (4 mg Intravenous Given 11/22/23 0134)  fentaNYL  (SUBLIMAZE ) injection 50 mcg (50 mcg Intravenous Given 11/22/23 0135)  iohexol  (OMNIPAQUE ) 300 MG/ML solution 100 mL (100 mLs Intravenous Contrast Given 11/22/23 0215)  HYDROmorphone (DILAUDID) injection 1 mg (1 mg Intravenous Given 11/22/23 0259)     Procedures  /  Critical Care Procedures  ED Course and Medical Decision Making  Initial Impression and Ddx Differential diagnosis includes  Past medical/surgical history that increases complexity of ED encounter:  abdominal surgeries  Interpretation of Diagnostics I personally reviewed the Laboratory Testing and my  interpretation is as follows: No significant blood count or electrolyte disturbance.  CT showing evidence of at least partial small bowel obstruction related to adhesions.  Patient Reassessment and Ultimate Disposition/Management     Plan is for hospitalist admission with general surgery consultation.  Patient management required discussion with the following services or consulting groups:  Hospitalist Service and General/Trauma Surgery  Complexity of Problems Addressed Acute illness or injury that poses threat of life of bodily function  Additional Data Reviewed and Analyzed Further history obtained from: Further history from spouse/family member  Additional Factors Impacting ED Encounter Risk Consideration of hospitalization  Merrick Abe. Harless Lien, MD [facility] [email or web address]  Final Clinical Impressions(s) / ED Diagnoses     ICD-10-CM   1. Abdominal pain, unspecified abdominal location  R10.9       ED Discharge Orders     None        Discharge Instructions Discussed with and Provided to Patient:   Discharge Instructions   None      Edson Graces, MD 11/22/23 [phone]

## 2023-11-22 NOTE — ED Notes (Signed)
 Patient resting quietly at this time. Denies any further complaints of n/v at this time. States pain os "tolerable". Patient awaiting transport to [facility]. Husband at bedside

## 2023-11-22 NOTE — Plan of Care (Signed)

## 2023-11-22 NOTE — Plan of Care (Signed)
   Problem: Health Behavior/Discharge Planning: Goal: Ability to manage health-related needs will improve Outcome: Progressing   Problem: Clinical Measurements: Goal: Ability to maintain clinical measurements within normal limits will improve Outcome: Progressing Goal: Will remain free from infection Outcome: Progressing Goal: Diagnostic test results will improve Outcome: Progressing Goal: Respiratory complications will improve Outcome: Progressing

## 2023-11-22 NOTE — ED Notes (Signed)
 Shawna Waters with cl has been called for transport

## 2023-11-23 ENCOUNTER — Observation Stay (HOSPITAL_COMMUNITY)

## 2023-11-23 DIAGNOSIS — K439 Ventral hernia without obstruction or gangrene: Secondary | ICD-10-CM | POA: Diagnosis present

## 2023-11-23 DIAGNOSIS — Z8673 Personal history of transient ischemic attack (TIA), and cerebral infarction without residual deficits: Secondary | ICD-10-CM | POA: Diagnosis not present

## 2023-11-23 DIAGNOSIS — Z90710 Acquired absence of both cervix and uterus: Secondary | ICD-10-CM | POA: Diagnosis not present

## 2023-11-23 DIAGNOSIS — I10 Essential (primary) hypertension: Secondary | ICD-10-CM | POA: Diagnosis present

## 2023-11-23 DIAGNOSIS — N179 Acute kidney failure, unspecified: Secondary | ICD-10-CM | POA: Diagnosis present

## 2023-11-23 DIAGNOSIS — E669 Obesity, unspecified: Secondary | ICD-10-CM | POA: Diagnosis present

## 2023-11-23 DIAGNOSIS — Z7902 Long term (current) use of antithrombotics/antiplatelets: Secondary | ICD-10-CM | POA: Diagnosis not present

## 2023-11-23 DIAGNOSIS — Z8249 Family history of ischemic heart disease and other diseases of the circulatory system: Secondary | ICD-10-CM | POA: Diagnosis not present

## 2023-11-23 DIAGNOSIS — K565 Intestinal adhesions [bands], unspecified as to partial versus complete obstruction: Secondary | ICD-10-CM | POA: Diagnosis present

## 2023-11-23 DIAGNOSIS — Z6831 Body mass index (BMI) 31.0-31.9, adult: Secondary | ICD-10-CM | POA: Diagnosis not present

## 2023-11-23 DIAGNOSIS — Z833 Family history of diabetes mellitus: Secondary | ICD-10-CM | POA: Diagnosis not present

## 2023-11-23 DIAGNOSIS — K5651 Intestinal adhesions [bands], with partial obstruction: Secondary | ICD-10-CM | POA: Diagnosis present

## 2023-11-23 DIAGNOSIS — Z8661 Personal history of infections of the central nervous system: Secondary | ICD-10-CM | POA: Diagnosis not present

## 2023-11-23 DIAGNOSIS — F32A Depression, unspecified: Secondary | ICD-10-CM | POA: Diagnosis present

## 2023-11-23 DIAGNOSIS — K55019 Acute (reversible) ischemia of small intestine, extent unspecified: Secondary | ICD-10-CM | POA: Diagnosis present

## 2023-11-23 DIAGNOSIS — F419 Anxiety disorder, unspecified: Secondary | ICD-10-CM | POA: Diagnosis present

## 2023-11-23 DIAGNOSIS — E785 Hyperlipidemia, unspecified: Secondary | ICD-10-CM | POA: Diagnosis present

## 2023-11-23 LAB — CBC
HCT: 45.5 % (ref 36.0–46.0)
Hemoglobin: 13.9 g/dL (ref 12.0–15.0)
MCH: 32 pg (ref 26.0–34.0)
MCHC: 30.5 g/dL (ref 30.0–36.0)
MCV: 104.6 fL — ABNORMAL HIGH (ref 80.0–100.0)
Platelets: 199 10*3/uL (ref 150–400)
RBC: 4.35 MIL/uL (ref 3.87–5.11)
RDW: 12 % (ref 11.5–15.5)
WBC: 7.9 10*3/uL (ref 4.0–10.5)
nRBC: 0 % (ref 0.0–0.2)

## 2023-11-23 LAB — BASIC METABOLIC PANEL WITH GFR
Anion gap: 12 (ref 5–15)
BUN: 18 mg/dL (ref 8–23)
CO2: 21 mmol/L — ABNORMAL LOW (ref 22–32)
Calcium: 9 mg/dL (ref 8.9–10.3)
Chloride: 108 mmol/L (ref 98–111)
Creatinine, Ser: 0.91 mg/dL (ref 0.44–1.00)
GFR, Estimated: >60 mL/min (ref >60)
Glucose, Bld: 91 mg/dL (ref 70–99)
Potassium: 3.7 mmol/L (ref 3.5–5.1)
Sodium: 141 mmol/L (ref 135–145)

## 2023-11-23 LAB — HIV ANTIBODY (ROUTINE TESTING W REFLEX): HIV Screen 4th Generation wRfx: NONREACTIVE

## 2023-11-23 LAB — MAGNESIUM: Magnesium: 2.1 mg/dL (ref 1.7–2.4)

## 2023-11-23 MED ORDER — POTASSIUM CHLORIDE IN NACL 20-0.9 MEQ/L-% IV SOLN
INTRAVENOUS | Status: AC
Start: 2023-11-23 — End: 2023-11-24
  Filled 2023-11-23 (×2): qty 1000

## 2023-11-23 MED ORDER — PHENOL 1.4 % MT LIQD
2.0000 | OROMUCOSAL | Status: DC | PRN
  Administered 2023-11-23: 2 via OROMUCOSAL
  Filled 2023-11-23: qty 177

## 2023-11-23 MED ORDER — LIDOCAINE HCL URETHRAL/MUCOSAL 2 % EX GEL
1.0000 | Freq: Once | CUTANEOUS | Status: AC
  Administered 2023-11-23: 1 via TOPICAL
  Filled 2023-11-23: qty 5

## 2023-11-23 MED ORDER — HYDROMORPHONE HCL 1 MG/ML IJ SOLN: 0.5000 mg | INTRAMUSCULAR | Status: DC | PRN

## 2023-11-23 MED ORDER — LORAZEPAM 2 MG/ML IJ SOLN
0.5000 mg | Freq: Once | INTRAMUSCULAR | Status: AC
  Administered 2023-11-23: 0.5 mg via INTRAVENOUS
  Filled 2023-11-23: qty 1

## 2023-11-23 NOTE — Progress Notes (Signed)
 [facility] Progress Note     Subjective: CC:  Abdominal pain improved. Has had nausea and an episode of emesis at 0100 today. Unclear if nausea is partially due to IV morphine. Some flatus. No BM since admission. Had a BM 4/27.  Objective: Vital signs in last 24 hours: Temp:  [97.8 F (36.6 C)-99.1 F (37.3 C)] 99.1 F (37.3 C) (04/29 0533) Pulse Rate:  [53-69] 69 (04/29 0533) Resp:  [16-20] 16 (04/29 0533) BP: (122-135)/(56-70) 128/66 (04/29 0533) SpO2:  [92 %-99 %] 92 % (04/29 0533) Weight:  [79.6 kg] 79.6 kg (04/28 1024) Last BM Date : 11/21/23  Intake/Output from previous day: 04/28 0701 - 04/29 0700 In: 871.5 [I.V.:821.5; IV Piggyback:50] Out: 1050 [Urine:1050] Intake/Output this shift: Total I/O In: 760.3 [I.V.:760.3] Out: -   PE: Gen:  Alert, NAD, pleasant Card:  Regular rate and rhythm, pedal pulses 2+ BL Pulm:  Normal effort, clear to auscultation bilaterally Abd: Soft, non-tender, minimal upper abdominal distention and tympany, no peritonitis, no guarding.  Skin: warm and dry, no rashes  Psych: A&Ox3   Lab Results:  Recent Labs    11/22/23 0110 11/23/23 0405  WBC 6.6 7.9  HGB 12.8 13.9  HCT 36.7 45.5  PLT 168 199   BMET Recent Labs    11/22/23 0110 11/23/23 0405  NA 137 141  K 3.7 3.7  CL 101 108  CO2 25 21*  GLUCOSE 122* 91  BUN 18 18  CREATININE 1.00 0.91  CALCIUM  9.6 9.0   PT/INR No results for input(s): "LABPROT", "INR" in the last 72 hours. CMP     Component Value Date/Time   NA 141 11/23/2023 0405   NA 141 10/29/2017 0909   K 3.7 11/23/2023 0405   CL 108 11/23/2023 0405   CO2 21 (L) 11/23/2023 0405   GLUCOSE 91 11/23/2023 0405   BUN 18 11/23/2023 0405   BUN 12 10/29/2017 0909   CREATININE 0.91 11/23/2023 0405   CREATININE 0.81 04/21/2016 0855   CALCIUM  9.0 11/23/2023 0405   PROT 6.8 11/22/2023 0110   PROT 6.5 10/29/2017 0909   ALBUMIN 4.1 11/22/2023 0110   ALBUMIN 3.9 10/29/2017 0909   AST 26 11/22/2023  0110   ALT 12 11/22/2023 0110   ALKPHOS 88 11/22/2023 0110   BILITOT 0.5 11/22/2023 0110   BILITOT 0.5 10/29/2017 0909   GFRNONAA >60 11/23/2023 0405   GFRNONAA 54 (L) 10/10/2014 1234   GFRAA 84 10/29/2017 0909   GFRAA 62 10/10/2014 1234   Lipase     Component Value Date/Time   LIPASE 21 11/22/2023 0110       Studies/Results: DG Abd Portable 1V Result Date: 11/23/2023 CLINICAL DATA:  Follow up small bowel dilation EXAM: PORTABLE ABDOMEN - 1 VIEW COMPARISON:  11/22/2023 FINDINGS: Administered contrast is again noted within the stomach. Multiple dilated loops of small bowel again show contrast material. No colonic contrast is noted at this time. Continued follow-up is recommended. Contrast in the bladder is noted from prior CT. No free air is seen. IMPRESSION: Persistent small bowel obstruction without evidence of contrast passage to the colon. Electronically Signed   By: Violeta Grey M.D.   On: 11/23/2023 08:20   DG Abd Portable 1V-Small Bowel Obstruction Protocol-initial, 8 hr delay Result Date: 11/22/2023 CLINICAL DATA:  Small-bowel obstruction, 8 hour examination EXAM: PORTABLE ABDOMEN - 1 VIEW COMPARISON:  None Available. FINDINGS: Administered oral contrast opacifies the gastric fundus is seen within multiple dilated proximal loops of small bowel in keeping  with a persistent mid to distal small bowel obstruction. There is no contrast identified within the right lower quadrant or ascending colon. No gross free intraperitoneal gas. IMPRESSION: 1. Persistent mid to distal small bowel obstruction. No contrast identified within the right lower quadrant or ascending colon. Electronically Signed   By: Worthy Heads M.D.   On: 11/22/2023 22:31   CT ABDOMEN PELVIS W CONTRAST Result Date: 11/22/2023 EXAM: CT ABDOMEN AND PELVIS WITH CONTRAST 11/22/2023 02:28:35 AM TECHNIQUE: CT of the abdomen and pelvis was performed with intravenous contrast. Multiplanar reformatted images are provided for  review. Automated exposure control, iterative reconstruction, and/or weight based adjustment of the mA/kV was utilized to reduce the radiation dose to as low as reasonably achievable. COMPARISON: None available. CLINICAL HISTORY: Abdominal pain, acute, nonlocalized; right sided. Pt BIB [facility] from home with RUQ abdominal pain x 2 weeks with worsening x 2-3 hours. Pt has nausea and vomiting. Pt was given Zofran  4 mg and 500mL LR PTA. Pt is actively vomiting upon arrival. FINDINGS: LOWER CHEST: Dependent atelectasis at the lung bases. HEPATOBILIARY: Liver is unremarkable. Status post cholecystectomy. SPLEEN: No acute abnormality. PANCREAS: No acute abnormality. ADRENAL GLANDS: No acute abnormality. KIDNEYS, URETERS: No stones in the kidneys or ureters. No evidence of hydronephrosis. No evidence of perinephric or periureteral stranding. GI AND BOWEL: Dilated small bowel in the central abdomen, suggesting at least partial small bowel obstruction, likely on the basis of adhesions (image 59). Appendix is not clearly visualized. PELVIS: Trace pelvic ascites. PERITONEUM AND RETROPERITONEUM: No evidence of free air. LYMPH NODES: No evidence of lymphadenopathy. BONES AND SOFT TISSUES: Small left paramidline lower abdominal wall fat-containing hernia with mild stranding (image 77). No acute osseous abnormality. Incidental adrenal and/or renal findings do not require follow up imaging. IMPRESSION: 1. Suspected partial small bowel obstruction, likely on the basis of adhesions. 2. Small left paramidline lower abdominal wall fat-containing hernia with mild stranding. Electronically signed by: Zadie Herter MD 11/22/2023 02:34 AM EDT RP Workstation: [id number]    Anti-infectives: Anti-infectives (From admission, onward)    None        Assessment/Plan  pSBO 68 y/o F who presents with 5 days of abdominal pain that acutely worsened 4/28.  - afebrile, hemodynamically stable, WBC 6.6  - mild RLQ abdominal soreness on  exam, no peritonitis  - CT scan of the abdomen and pelvis 4/28 shows partial small bowel obstruction as well as a fat containing abdominal wall hernia. Her hernia does not appear to be the point of her bowel obstruction. I was not able to palpate this hernia during my exam.    - oral SBO protocol yesterday >> contrast still in the stomach. Episode of emesis. No BM yet. Place NG tube today for decompression. Could also consider repeat small bowel protocol later today vs tomorrow AM. Failure to improve will likely warrant diagnostic laparoscopy vs exploratory laparotomy. Last reported dose plavix  4/27 so likely wouldn't operate until that washes out (5/1 or 5/2).   - I forgot to ask her about her colorectal history, I do not see a colonoscopy on file. Will need to follow up on this for screening purposes. I see a negative cologuard test from 03/2022.   FEN - NPO, place NG to LIWS.  VTE - SCD's, lovenox  ID - none indicated Admit - TRH service   Below per TRH - Hx CVA - pt denies deficits from this, does report issues with speech when she has severe anxiety; please hold plavix  HTN -  on Benicar at home, ok for sips w/ meds here HLD - on statin tx Anxiety/depression - wellbutrin  XL 150 mg at home    LOS: 0 days   I reviewed nursing notes, hospitalist notes, last 24 h vitals and pain scores, last 48 h intake and output, last 24 h labs and trends, and last 24 h imaging results.  This care required moderate level of medical decision making.   Michial Akin, PA-C [facility] Please see Amion for pager number during day hours 7:00am-4:30pm

## 2023-11-23 NOTE — Plan of Care (Signed)
  Problem: Clinical Measurements: Goal: Respiratory complications will improve Outcome: Progressing Goal: Cardiovascular complication will be avoided Outcome: Progressing   Problem: Activity: Goal: Risk for activity intolerance will decrease Outcome: Progressing   Problem: Nutrition: Goal: Adequate nutrition will be maintained Outcome: Progressing   Problem: Coping: Goal: Level of anxiety will decrease Outcome: Progressing   

## 2023-11-23 NOTE — Progress Notes (Addendum)
 [facility] PROGRESS NOTE    Progress Note  Shawna Waters  BMW:[id number] DOB: 1956/01/22 DOA: 11/22/2023 PCP: Patient, No Pcp Per     Brief Narrative:   Shawna Waters is an 68 y.o. female past medical history significant for essential hypertension hysterectomy admitted to the hospital for small bowel obstruction.  Has not been passing gas or bowel movement for the past 2 days.   Assessment/Plan:   Principal Problem:   Small bowel obstruction due to adhesions [facility]) CT scan of the abdomen pelvis shows small bowel obstruction and a small abdominal wall fat containing hernia with mild stranding Surgery was consulted recommended conservative management with IV fluids, NG tube for gastric decompression.  Which will be placed this morning. Currently potassium greater than 4 magnesium greater than 2, ambulate as tolerated. They also recommended a small bowel protocol, which showed still no contrast identified in the right lower quadrant or descending colon. Further management per surgery.  Essential hypertension: Continue IV hydralazine  as needed. Can resume home meds when able to take orals.  DVT prophylaxis: lovenox  Family Communication:none Status is: Observation The patient remains OBS appropriate and will d/c before 2 midnights.    Code Status:     Code Status Orders  (From admission, onward)           Start     Ordered   11/22/23 1110  Full code  Continuous       Question:  By:  Answer:  Consent: discussion documented in EHR   11/22/23 1110           Code Status History     Date Active Date Inactive Code Status Order ID Comments User Context   10/03/2014 0447 10/04/2014 2227 Full Code [id number]  Angelene Kelly, MD Inpatient   09/27/2014 2245 09/28/2014 1731 Full Code [id number]  Justina Oman, MD ED         IV Access:   Peripheral IV   Procedures and diagnostic studies:   DG Abd Portable 1V Result Date: 11/23/2023 CLINICAL DATA:  Follow  up small bowel dilation EXAM: PORTABLE ABDOMEN - 1 VIEW COMPARISON:  11/22/2023 FINDINGS: Administered contrast is again noted within the stomach. Multiple dilated loops of small bowel again show contrast material. No colonic contrast is noted at this time. Continued follow-up is recommended. Contrast in the bladder is noted from prior CT. No free air is seen. IMPRESSION: Persistent small bowel obstruction without evidence of contrast passage to the colon. Electronically Signed   By: Violeta Grey M.D.   On: 11/23/2023 08:20   DG Abd Portable 1V-Small Bowel Obstruction Protocol-initial, 8 hr delay Result Date: 11/22/2023 CLINICAL DATA:  Small-bowel obstruction, 8 hour examination EXAM: PORTABLE ABDOMEN - 1 VIEW COMPARISON:  None Available. FINDINGS: Administered oral contrast opacifies the gastric fundus is seen within multiple dilated proximal loops of small bowel in keeping with a persistent mid to distal small bowel obstruction. There is no contrast identified within the right lower quadrant or ascending colon. No gross free intraperitoneal gas. IMPRESSION: 1. Persistent mid to distal small bowel obstruction. No contrast identified within the right lower quadrant or ascending colon. Electronically Signed   By: Worthy Heads M.D.   On: 11/22/2023 22:31   CT ABDOMEN PELVIS W CONTRAST Result Date: 11/22/2023 EXAM: CT ABDOMEN AND PELVIS WITH CONTRAST 11/22/2023 02:28:35 AM TECHNIQUE: CT of the abdomen and pelvis was performed with intravenous contrast. Multiplanar reformatted images are provided for review. Automated exposure control, iterative reconstruction, and/or weight  based adjustment of the mA/kV was utilized to reduce the radiation dose to as low as reasonably achievable. COMPARISON: None available. CLINICAL HISTORY: Abdominal pain, acute, nonlocalized; right sided. Pt BIB [facility] from home with RUQ abdominal pain x 2 weeks with worsening x 2-3 hours. Pt has nausea and vomiting. Pt was given Zofran  4 mg  and 500mL LR PTA. Pt is actively vomiting upon arrival. FINDINGS: LOWER CHEST: Dependent atelectasis at the lung bases. HEPATOBILIARY: Liver is unremarkable. Status post cholecystectomy. SPLEEN: No acute abnormality. PANCREAS: No acute abnormality. ADRENAL GLANDS: No acute abnormality. KIDNEYS, URETERS: No stones in the kidneys or ureters. No evidence of hydronephrosis. No evidence of perinephric or periureteral stranding. GI AND BOWEL: Dilated small bowel in the central abdomen, suggesting at least partial small bowel obstruction, likely on the basis of adhesions (image 59). Appendix is not clearly visualized. PELVIS: Trace pelvic ascites. PERITONEUM AND RETROPERITONEUM: No evidence of free air. LYMPH NODES: No evidence of lymphadenopathy. BONES AND SOFT TISSUES: Small left paramidline lower abdominal wall fat-containing hernia with mild stranding (image 77). No acute osseous abnormality. Incidental adrenal and/or renal findings do not require follow up imaging. IMPRESSION: 1. Suspected partial small bowel obstruction, likely on the basis of adhesions. 2. Small left paramidline lower abdominal wall fat-containing hernia with mild stranding. Electronically signed by: Zadie Herter MD 11/22/2023 02:34 AM EDT RP Workstation: [id number]     Medical Consultants:   None.   Subjective:    Shawna Waters is passing flatus no bowel movements still feels nauseated, NG tube has not been placed.  Objective:    Vitals:   11/22/23 1353 11/22/23 1739 11/22/23 2149 11/23/23 0533  BP: 131/67 135/60 129/70 128/66  Pulse: (!) 56 (!) 56 67 69  Resp: 16 16 20 16   Temp: 97.9 F (36.6 C) 97.8 F (36.6 C) 98.9 F (37.2 C) 99.1 F (37.3 C)  TempSrc: Oral Oral Oral Oral  SpO2: 98% 98% 92% 92%  Weight:      Height:       SpO2: 92 %   Intake/Output Summary (Last 24 hours) at 11/23/2023 0836 Last data filed at 11/23/2023 0723 Gross per 24 hour  Intake 1631.76 ml  Output 1050 ml  Net 581.76 ml   Filed  Weights   11/22/23 1024  Weight: 79.6 kg    Exam: General exam: In no acute distress. Respiratory system: Good air movement and clear to auscultation. Cardiovascular system: S1 & S2 heard, RRR. No JVD. Gastrointestinal system: Abdomen is nondistended, soft and nontender.  Extremities: No pedal edema. Skin: No rashes, lesions or ulcers Psychiatry: Judgement and insight appear normal. Mood & affect appropriate.    Data Reviewed:    Labs: Basic Metabolic Panel: Recent Labs  Lab 11/22/23 0110 11/23/23 0405  NA 137 141  K 3.7 3.7  CL 101 108  CO2 25 21*  GLUCOSE 122* 91  BUN 18 18  CREATININE 1.00 0.91  CALCIUM  9.6 9.0   GFR Estimated Creatinine Clearance: 60.8 mL/min (by C-G formula based on SCr of 0.91 mg/dL). Liver Function Tests: Recent Labs  Lab 11/22/23 0110  AST 26  ALT 12  ALKPHOS 88  BILITOT 0.5  PROT 6.8  ALBUMIN 4.1   Recent Labs  Lab 11/22/23 0110  LIPASE 21   No results for input(s): "AMMONIA" in the last 168 hours. Coagulation profile No results for input(s): "INR", "PROTIME" in the last 168 hours. COVID-19 Labs  No results for input(s): "DDIMER", "FERRITIN", "LDH", "CRP" in the last  72 hours.  No results found for: "SARSCOV2NAA"  CBC: Recent Labs  Lab 11/22/23 0110 11/23/23 0405  WBC 6.6 7.9  HGB 12.8 13.9  HCT 36.7 45.5  MCV 93.6 104.6*  PLT 168 199   Cardiac Enzymes: No results for input(s): "CKTOTAL", "CKMB", "CKMBINDEX", "TROPONINI" in the last 168 hours. BNP (last 3 results) No results for input(s): "PROBNP" in the last 8760 hours. CBG: No results for input(s): "GLUCAP" in the last 168 hours. D-Dimer: No results for input(s): "DDIMER" in the last 72 hours. Hgb A1c: No results for input(s): "HGBA1C" in the last 72 hours. Lipid Profile: No results for input(s): "CHOL", "HDL", "LDLCALC", "TRIG", "CHOLHDL", "LDLDIRECT" in the last 72 hours. Thyroid  function studies: No results for input(s): "TSH", "T4TOTAL", "T3FREE",  "THYROIDAB" in the last 72 hours.  Invalid input(s): "FREET3" Anemia work up: No results for input(s): "VITAMINB12", "FOLATE", "FERRITIN", "TIBC", "IRON", "RETICCTPCT" in the last 72 hours. Sepsis Labs: Recent Labs  Lab 11/22/23 0110 11/23/23 0405  WBC 6.6 7.9   Microbiology No results found for this or any previous visit (from the past 240 hours).   Medications:    enoxaparin  (LOVENOX ) injection  40 mg Subcutaneous Q24H   Continuous Infusions:  sodium chloride  75 mL/hr at 11/23/23 0723   promethazine (PHENERGAN) injection (IM or IVPB) 12.5 mg (11/23/23 0725)      LOS: 0 days   Macdonald Savoy  [facility]  11/23/2023, 8:36 AM

## 2023-11-24 ENCOUNTER — Inpatient Hospital Stay (HOSPITAL_COMMUNITY)

## 2023-11-24 DIAGNOSIS — K565 Intestinal adhesions [bands], unspecified as to partial versus complete obstruction: Secondary | ICD-10-CM | POA: Diagnosis not present

## 2023-11-24 LAB — BASIC METABOLIC PANEL WITH GFR
Anion gap: 9 (ref 5–15)
BUN: 26 mg/dL — ABNORMAL HIGH (ref 8–23)
CO2: 25 mmol/L (ref 22–32)
Calcium: 9 mg/dL (ref 8.9–10.3)
Chloride: 110 mmol/L (ref 98–111)
Creatinine, Ser: 1.01 mg/dL — ABNORMAL HIGH (ref 0.44–1.00)
GFR, Estimated: >60 mL/min (ref >60)
Glucose, Bld: 86 mg/dL (ref 70–99)
Potassium: 4 mmol/L (ref 3.5–5.1)
Sodium: 144 mmol/L (ref 135–145)

## 2023-11-24 MED ORDER — LORAZEPAM 2 MG/ML IJ SOLN
0.5000 mg | Freq: Three times a day (TID) | INTRAMUSCULAR | Status: DC | PRN
  Administered 2023-11-24 – 2023-11-26 (×2): 0.5 mg via INTRAVENOUS
  Filled 2023-11-24 (×2): qty 1

## 2023-11-24 MED ORDER — POTASSIUM CHLORIDE IN NACL 20-0.9 MEQ/L-% IV SOLN
INTRAVENOUS | Status: DC
  Filled 2023-11-24 (×2): qty 1000

## 2023-11-24 NOTE — Plan of Care (Signed)

## 2023-11-24 NOTE — Progress Notes (Signed)
 PROGRESS NOTE    Shawna Waters  ZOX:[id number] DOB: Jul 26, 1956 DOA: 11/22/2023 PCP: Patient, No Pcp Per   Brief Narrative:  68 year old female with history of essential hypertension, depression, hysterectomy, cesarean section, unspecified stroke/TIA on Plavix  has been admitted with small bowel obstruction.  General surgery has been consulted.  She currently has an NG tube.  Assessment & Plan:   Small bowel obstruction -Possibly due to adhesions. - General Surgery following.  Continue NG tube.  Continue IV fluids, n.p.o., pain management.  Passing flatus but no bowel movement yet  Essential hypertension--blood pressure currently stable.  Continue IV hydralazine  as needed  History of unspecified CVA -Plavix  on hold.  Outpatient follow-up with PCP and/or neurology    DVT prophylaxis: Lovenox  Code Status: Full Family Communication: Husband at bedside Disposition Plan: Status is: Inpatient Remains inpatient appropriate because: Of severity of illness    Consultants: General Surgery  Procedures: None  Antimicrobials: None   Subjective: Patient seen and examined at bedside.  Still having intermittent abdominal pain with nausea.  Passing flatus but has not had bowel movement yet.  Objective: Vitals:   11/23/23 0533 11/23/23 1203 11/23/23 2056 11/24/23 0414  BP: 128/66 136/77 131/64 132/74  Pulse: 69 85 74 81  Resp: 16 16 17    Temp: 99.1 F (37.3 C) 98.8 F (37.1 C) 98.2 F (36.8 C) 98.1 F (36.7 C)  TempSrc: Oral Oral    SpO2: 92% 92% 95% 91%  Weight:      Height:        Intake/Output Summary (Last 24 hours) at 11/24/2023 0904 Last data filed at 11/24/2023 0500 Gross per 24 hour  Intake 527.08 ml  Output 2400 ml  Net -1872.92 ml   Filed Weights   11/22/23 1024  Weight: 79.6 kg    Examination:  General exam: Appears calm and comfortable.  NG tube present.  On room air. Respiratory system: Bilateral decreased breath sounds at bases Cardiovascular system:  S1 & S2 heard, Rate controlled Gastrointestinal system: Abdomen is nondistended, soft and mildly tender.  Bowel sounds sluggish. Extremities: No cyanosis, clubbing, edema   Data Reviewed: I have personally reviewed following labs and imaging studies  CBC: Recent Labs  Lab 11/22/23 0110 11/23/23 0405  WBC 6.6 7.9  HGB 12.8 13.9  HCT 36.7 45.5  MCV 93.6 104.6*  PLT 168 199   Basic Metabolic Panel: Recent Labs  Lab 11/22/23 0110 11/23/23 0405 11/23/23 0440 11/24/23 0414  NA 137 141  --  144  K 3.7 3.7  --  4.0  CL 101 108  --  110  CO2 25 21*  --  25  GLUCOSE 122* 91  --  86  BUN 18 18  --  26*  CREATININE 1.00 0.91  --  1.01*  CALCIUM  9.6 9.0  --  9.0  MG  --   --  2.1  --    GFR: Estimated Creatinine Clearance: 54.8 mL/min (A) (by C-G formula based on SCr of 1.01 mg/dL (H)). Liver Function Tests: Recent Labs  Lab 11/22/23 0110  AST 26  ALT 12  ALKPHOS 88  BILITOT 0.5  PROT 6.8  ALBUMIN 4.1   Recent Labs  Lab 11/22/23 0110  LIPASE 21   No results for input(s): "AMMONIA" in the last 168 hours. Coagulation Profile: No results for input(s): "INR", "PROTIME" in the last 168 hours. Cardiac Enzymes: No results for input(s): "CKTOTAL", "CKMB", "CKMBINDEX", "TROPONINI" in the last 168 hours. BNP (last 3 results) No results for  input(s): "PROBNP" in the last 8760 hours. HbA1C: No results for input(s): "HGBA1C" in the last 72 hours. CBG: No results for input(s): "GLUCAP" in the last 168 hours. Lipid Profile: No results for input(s): "CHOL", "HDL", "LDLCALC", "TRIG", "CHOLHDL", "LDLDIRECT" in the last 72 hours. Thyroid  Function Tests: No results for input(s): "TSH", "T4TOTAL", "FREET4", "T3FREE", "THYROIDAB" in the last 72 hours. Anemia Panel: No results for input(s): "VITAMINB12", "FOLATE", "FERRITIN", "TIBC", "IRON", "RETICCTPCT" in the last 72 hours. Sepsis Labs: No results for input(s): "PROCALCITON", "LATICACIDVEN" in the last 168 hours.  No results  found for this or any previous visit (from the past 240 hours).       Radiology Studies: DG Abd Portable 1V Result Date: 11/23/2023 CLINICAL DATA:  Nasogastric tube placement. EXAM: PORTABLE ABDOMEN - 1 VIEW COMPARISON:  Same day FINDINGS: Distal tip of nasogastric tube is seen in expected position of distal stomach. Mildly dilated small bowel loops are noted concerning for possible obstruction. No colonic dilatation is noted. IMPRESSION: Nasogastric tube tip seen in expected position of distal stomach. Small bowel dilatation is again noted as described above. Electronically Signed   By: Rosalene Colon M.D.   On: 11/23/2023 11:12   DG Abd Portable 1V Result Date: 11/23/2023 CLINICAL DATA:  Follow up small bowel dilation EXAM: PORTABLE ABDOMEN - 1 VIEW COMPARISON:  11/22/2023 FINDINGS: Administered contrast is again noted within the stomach. Multiple dilated loops of small bowel again show contrast material. No colonic contrast is noted at this time. Continued follow-up is recommended. Contrast in the bladder is noted from prior CT. No free air is seen. IMPRESSION: Persistent small bowel obstruction without evidence of contrast passage to the colon. Electronically Signed   By: Violeta Grey M.D.   On: 11/23/2023 08:20   DG Abd Portable 1V-Small Bowel Obstruction Protocol-initial, 8 hr delay Result Date: 11/22/2023 CLINICAL DATA:  Small-bowel obstruction, 8 hour examination EXAM: PORTABLE ABDOMEN - 1 VIEW COMPARISON:  None Available. FINDINGS: Administered oral contrast opacifies the gastric fundus is seen within multiple dilated proximal loops of small bowel in keeping with a persistent mid to distal small bowel obstruction. There is no contrast identified within the right lower quadrant or ascending colon. No gross free intraperitoneal gas. IMPRESSION: 1. Persistent mid to distal small bowel obstruction. No contrast identified within the right lower quadrant or ascending colon. Electronically Signed    By: Worthy Heads M.D.   On: 11/22/2023 22:31        Scheduled Meds:  enoxaparin  (LOVENOX ) injection  40 mg Subcutaneous Q24H   Continuous Infusions:  0.9 % NaCl with KCl 20 mEq / L 75 mL/hr at 11/24/23 0016   promethazine (PHENERGAN) injection (IM or IVPB) 12.5 mg (11/23/23 2032)          Audria Leather, MD [facility] 11/24/2023, 9:04 AM

## 2023-11-24 NOTE — Plan of Care (Signed)
  Problem: Clinical Measurements: Goal: Diagnostic test results will improve Outcome: Not Met (in progress)   Problem: Nutrition: Goal: Adequate nutrition will be maintained Outcome: Not Met (in progress)   Problem: Clinical Measurements: Goal: Ability to maintain clinical measurements within normal limits will improve Outcome: Progressing Goal: Will remain free from infection Outcome: Progressing   Problem: Coping: Goal: Level of anxiety will decrease Outcome: Progressing

## 2023-11-24 NOTE — Progress Notes (Addendum)
 [facility] Progress Note     Subjective: CC:  Says she feels better - abdominal pain resolved. Still having small amts flatus. No BM. NGT 2,100 mL/24h  Objective: Vital signs in last 24 hours: Temp:  [98.1 F (36.7 C)-98.8 F (37.1 C)] 98.1 F (36.7 C) (04/30 0414) Pulse Rate:  [74-85] 81 (04/30 0414) Resp:  [16-17] 17 (04/29 2056) BP: (131-136)/(64-77) 132/74 (04/30 0414) SpO2:  [91 %-95 %] 91 % (04/30 0414) Last BM Date : 11/21/23  Intake/Output from previous day: 04/29 0701 - 04/30 0700 In: 1287.4 [I.V.:1146.9; IV Piggyback:140.5] Out: 2400 [Urine:300; Emesis/NG output:2100] Intake/Output this shift: No intake/output data recorded.  PE: Gen:  Alert, NAD, pleasant and cooperative Card:  Regular rate and rhythm Pulm:  Normal effort ORA Abd: Soft, non-tender, non-distended, no palpable hernia  Skin: warm and dry, no rashes  Psych: A&Ox3   Lab Results:  Recent Labs    11/22/23 0110 11/23/23 0405  WBC 6.6 7.9  HGB 12.8 13.9  HCT 36.7 45.5  PLT 168 199   BMET Recent Labs    11/23/23 0405 11/24/23 0414  NA 141 144  K 3.7 4.0  CL 108 110  CO2 21* 25  GLUCOSE 91 86  BUN 18 26*  CREATININE 0.91 1.01*  CALCIUM  9.0 9.0   PT/INR No results for input(s): "LABPROT", "INR" in the last 72 hours. CMP     Component Value Date/Time   NA 144 11/24/2023 0414   NA 141 10/29/2017 0909   K 4.0 11/24/2023 0414   CL 110 11/24/2023 0414   CO2 25 11/24/2023 0414   GLUCOSE 86 11/24/2023 0414   BUN 26 (H) 11/24/2023 0414   BUN 12 10/29/2017 0909   CREATININE 1.01 (H) 11/24/2023 0414   CREATININE 0.81 04/21/2016 0855   CALCIUM  9.0 11/24/2023 0414   PROT 6.8 11/22/2023 0110   PROT 6.5 10/29/2017 0909   ALBUMIN 4.1 11/22/2023 0110   ALBUMIN 3.9 10/29/2017 0909   AST 26 11/22/2023 0110   ALT 12 11/22/2023 0110   ALKPHOS 88 11/22/2023 0110   BILITOT 0.5 11/22/2023 0110   BILITOT 0.5 10/29/2017 0909   GFRNONAA >60 11/24/2023 0414   GFRNONAA 54 (L)  10/10/2014 1234   GFRAA 84 10/29/2017 0909   GFRAA 62 10/10/2014 1234   Lipase     Component Value Date/Time   LIPASE 21 11/22/2023 0110       Studies/Results: DG Abd Portable 1V Result Date: 11/23/2023 CLINICAL DATA:  Nasogastric tube placement. EXAM: PORTABLE ABDOMEN - 1 VIEW COMPARISON:  Same day FINDINGS: Distal tip of nasogastric tube is seen in expected position of distal stomach. Mildly dilated small bowel loops are noted concerning for possible obstruction. No colonic dilatation is noted. IMPRESSION: Nasogastric tube tip seen in expected position of distal stomach. Small bowel dilatation is again noted as described above. Electronically Signed   By: Rosalene Colon M.D.   On: 11/23/2023 11:12   DG Abd Portable 1V Result Date: 11/23/2023 CLINICAL DATA:  Follow up small bowel dilation EXAM: PORTABLE ABDOMEN - 1 VIEW COMPARISON:  11/22/2023 FINDINGS: Administered contrast is again noted within the stomach. Multiple dilated loops of small bowel again show contrast material. No colonic contrast is noted at this time. Continued follow-up is recommended. Contrast in the bladder is noted from prior CT. No free air is seen. IMPRESSION: Persistent small bowel obstruction without evidence of contrast passage to the colon. Electronically Signed   By: Violeta Grey M.D.   On:  11/23/2023 08:20   DG Abd Portable 1V-Small Bowel Obstruction Protocol-initial, 8 hr delay Result Date: 11/22/2023 CLINICAL DATA:  Small-bowel obstruction, 8 hour examination EXAM: PORTABLE ABDOMEN - 1 VIEW COMPARISON:  None Available. FINDINGS: Administered oral contrast opacifies the gastric fundus is seen within multiple dilated proximal loops of small bowel in keeping with a persistent mid to distal small bowel obstruction. There is no contrast identified within the right lower quadrant or ascending colon. No gross free intraperitoneal gas. IMPRESSION: 1. Persistent mid to distal small bowel obstruction. No contrast  identified within the right lower quadrant or ascending colon. Electronically Signed   By: Worthy Heads M.D.   On: 11/22/2023 22:31    Anti-infectives: Anti-infectives (From admission, onward)    None        Assessment/Plan  pSBO 68 y/o F who presents with 5 days of abdominal pain that acutely worsened 4/28.  - afebrile, hemodynamically stable, WBC 6.6  - mild RLQ abdominal soreness on exam, no peritonitis  - CT scan of the abdomen and pelvis 4/28 shows partial small bowel obstruction as well as a fat containing abdominal wall hernia. Her hernia does not appear to be the point of her bowel obstruction. I was not able to palpate this hernia during my exam.   - oral SBO protocol on admission >> contrast was still in the stomach on KUB. Episode of emesis. NG tube was placed yesterday. Today she remains clinically partially obstructed - some flatus, no BM, high NGT output. X-ray has not been read by radiology yet, to me looks like persistently dilated small bowel with a very small amt of air in the cecum.   Unfortunately she has not resolved with non-operative measures. Her last dose of plavix  was Sun 4/27 AM. Tentative plan for surgery tomorrow unless she improves in the next 24h. Diagnostic laparoscopy vs exploratory laparotomy, possible bowel resection.     FEN - NPO, NG to LIWS, ok for small amts ice chips and 2 popsicles today for comfort VTE - SCD's, lovenox  ID - none indicated Admit - [facility] service   Below per [facility] - Hx CVA - pt denies deficits from this, does report issues with speech when she has severe anxiety; please hold plavix  HTN - on Benicar at home, ok for sips w/ meds here HLD - on statin tx Anxiety/depression - wellbutrin  XL 150 mg at home    LOS: 1 day   I reviewed nursing notes, hospitalist notes, last 24 h vitals and pain scores, last 48 h intake and output, last 24 h labs and trends, and last 24 h imaging results.  This care required moderate level of medical  decision making.   Michial Akin, PA-C [facility] Please see Amion for pager number during day hours 7:00am-4:30pm

## 2023-11-25 ENCOUNTER — Encounter (HOSPITAL_COMMUNITY)
Admission: EM | Disposition: A | Discharge status: Home / Self Care | Payer: Self-pay | Source: Home / Self Care | Attending: Internal Medicine

## 2023-11-25 ENCOUNTER — Inpatient Hospital Stay (HOSPITAL_COMMUNITY): Admitting: Certified Registered Nurse Anesthetist

## 2023-11-25 ENCOUNTER — Other Ambulatory Visit: Payer: Self-pay

## 2023-11-25 ENCOUNTER — Inpatient Hospital Stay (HOSPITAL_COMMUNITY)

## 2023-11-25 ENCOUNTER — Encounter (HOSPITAL_COMMUNITY): Payer: Self-pay | Admitting: Internal Medicine

## 2023-11-25 DIAGNOSIS — I10 Essential (primary) hypertension: Secondary | ICD-10-CM

## 2023-11-25 DIAGNOSIS — E785 Hyperlipidemia, unspecified: Secondary | ICD-10-CM

## 2023-11-25 DIAGNOSIS — K565 Intestinal adhesions [bands], unspecified as to partial versus complete obstruction: Secondary | ICD-10-CM

## 2023-11-25 DIAGNOSIS — F32A Depression, unspecified: Secondary | ICD-10-CM

## 2023-11-25 HISTORY — PX: LAPAROSCOPY: SHX197

## 2023-11-25 SURGERY — LAPAROSCOPY, DIAGNOSTIC
Anesthesia: General

## 2023-11-25 MED ORDER — PROPOFOL 10 MG/ML IV BOLUS
INTRAVENOUS | Status: AC
  Filled 2023-11-25: qty 20

## 2023-11-25 MED ORDER — BUPIVACAINE HCL 0.25 % IJ SOLN
INTRAMUSCULAR | Status: DC | PRN
  Administered 2023-11-25: 20 mL

## 2023-11-25 MED ORDER — LACTATED RINGERS IR SOLN
Status: DC | PRN
  Administered 2023-11-25: 1000 mL

## 2023-11-25 MED ORDER — LACTATED RINGERS IV SOLN: INTRAVENOUS | Status: DC | PRN

## 2023-11-25 MED ORDER — SODIUM CHLORIDE 0.9 % IV SOLN: 12.5000 mg | INTRAVENOUS | Status: DC | PRN

## 2023-11-25 MED ORDER — HYDROMORPHONE HCL 1 MG/ML IJ SOLN
INTRAMUSCULAR | Status: AC
  Filled 2023-11-25: qty 1

## 2023-11-25 MED ORDER — POTASSIUM CHLORIDE IN NACL 20-0.9 MEQ/L-% IV SOLN
INTRAVENOUS | Status: AC
  Filled 2023-11-25 (×2): qty 1000

## 2023-11-25 MED ORDER — POTASSIUM CHLORIDE IN NACL 20-0.9 MEQ/L-% IV SOLN
INTRAVENOUS | Status: AC
  Filled 2023-11-25: qty 1000

## 2023-11-25 MED ORDER — PHENYLEPHRINE HCL-NACL 20-0.9 MG/250ML-% IV SOLN
INTRAVENOUS | Status: DC | PRN
  Administered 2023-11-25: 25 ug/min via INTRAVENOUS

## 2023-11-25 MED ORDER — CEFAZOLIN SODIUM-DEXTROSE 2-4 GM/100ML-% IV SOLN
2.0000 g | Freq: Once | INTRAVENOUS | Status: AC
  Administered 2023-11-25: 2 g via INTRAVENOUS
  Filled 2023-11-25: qty 100

## 2023-11-25 MED ORDER — HYDROMORPHONE HCL 1 MG/ML IJ SOLN
0.2500 mg | INTRAMUSCULAR | Status: DC | PRN
  Administered 2023-11-25 (×2): 0.5 mg via INTRAVENOUS

## 2023-11-25 MED ORDER — FENTANYL CITRATE (PF) 100 MCG/2ML IJ SOLN
INTRAMUSCULAR | Status: DC | PRN
  Administered 2023-11-25 (×2): 50 ug via INTRAVENOUS

## 2023-11-25 MED ORDER — LACTATED RINGERS IV SOLN
INTRAVENOUS | Status: DC
Start: 2023-11-25 — End: 2023-11-25

## 2023-11-25 MED ORDER — 0.9 % SODIUM CHLORIDE (POUR BTL) OPTIME
TOPICAL | Status: DC | PRN
  Administered 2023-11-25: 1000 mL

## 2023-11-25 MED ORDER — AMISULPRIDE (ANTIEMETIC) 5 MG/2ML IV SOLN: 10.0000 mg | Freq: Once | INTRAVENOUS | Status: DC | PRN

## 2023-11-25 MED ORDER — SUGAMMADEX SODIUM 200 MG/2ML IV SOLN
INTRAVENOUS | Status: DC | PRN
  Administered 2023-11-25: 200 mg via INTRAVENOUS

## 2023-11-25 MED ORDER — ONDANSETRON HCL 4 MG/2ML IJ SOLN
INTRAMUSCULAR | Status: DC | PRN
  Administered 2023-11-25: 4 mg via INTRAVENOUS

## 2023-11-25 MED ORDER — DEXAMETHASONE SODIUM PHOSPHATE 10 MG/ML IJ SOLN
INTRAMUSCULAR | Status: DC | PRN
  Administered 2023-11-25: 8 mg via INTRAVENOUS

## 2023-11-25 MED ORDER — ALBUMIN HUMAN 5 % IV SOLN: INTRAVENOUS | Status: DC | PRN

## 2023-11-25 MED ORDER — CHLORHEXIDINE GLUCONATE 0.12 % MT SOLN
15.0000 mL | Freq: Once | OROMUCOSAL | Status: AC
  Administered 2023-11-25: 15 mL via OROMUCOSAL

## 2023-11-25 MED ORDER — SUCCINYLCHOLINE CHLORIDE 200 MG/10ML IV SOSY
PREFILLED_SYRINGE | INTRAVENOUS | Status: DC | PRN
  Administered 2023-11-25: 120 mg via INTRAVENOUS

## 2023-11-25 MED ORDER — LIDOCAINE HCL (CARDIAC) PF 100 MG/5ML IV SOSY
PREFILLED_SYRINGE | INTRAVENOUS | Status: DC | PRN
  Administered 2023-11-25: 80 mg via INTRAVENOUS

## 2023-11-25 MED ORDER — ROCURONIUM BROMIDE 100 MG/10ML IV SOLN
INTRAVENOUS | Status: DC | PRN
  Administered 2023-11-25: 30 mg via INTRAVENOUS
  Administered 2023-11-25: 10 mg via INTRAVENOUS

## 2023-11-25 MED ORDER — OXYCODONE HCL 5 MG PO TABS: 5.0000 mg | ORAL_TABLET | Freq: Once | ORAL | Status: DC | PRN

## 2023-11-25 MED ORDER — FENTANYL CITRATE (PF) 100 MCG/2ML IJ SOLN
INTRAMUSCULAR | Status: AC
  Filled 2023-11-25: qty 2

## 2023-11-25 MED ORDER — OXYCODONE HCL 5 MG/5ML PO SOLN: 5.0000 mg | Freq: Once | ORAL | Status: DC | PRN

## 2023-11-25 MED ORDER — BUPIVACAINE-EPINEPHRINE (PF) 0.25% -1:200000 IJ SOLN
INTRAMUSCULAR | Status: AC
  Filled 2023-11-25: qty 30

## 2023-11-25 MED ORDER — BUPIVACAINE HCL (PF) 0.25 % IJ SOLN
INTRAMUSCULAR | Status: AC
Start: 2023-11-25 — End: ?
  Filled 2023-11-25: qty 30

## 2023-11-25 MED ORDER — PROPOFOL 10 MG/ML IV BOLUS
INTRAVENOUS | Status: DC | PRN
  Administered 2023-11-25: 150 mg via INTRAVENOUS

## 2023-11-25 MED ORDER — HYDROMORPHONE HCL 1 MG/ML IJ SOLN
1.0000 mg | INTRAMUSCULAR | Status: DC | PRN
  Administered 2023-11-25: 1 mg via INTRAVENOUS
  Filled 2023-11-25: qty 1

## 2023-11-25 MED ORDER — MIDAZOLAM HCL 5 MG/5ML IJ SOLN
INTRAMUSCULAR | Status: DC | PRN
  Administered 2023-11-25: 1 mg via INTRAVENOUS

## 2023-11-25 MED ORDER — MIDAZOLAM HCL 2 MG/2ML IJ SOLN
INTRAMUSCULAR | Status: AC
  Filled 2023-11-25: qty 2

## 2023-11-25 SURGICAL SUPPLY — 59 items
BAG COUNTER SPONGE SURGICOUNT (BAG) IMPLANT
BLADE EXTENDED COATED 6.5IN (ELECTRODE) IMPLANT
BLADE HEX COATED 2.75 (ELECTRODE) IMPLANT
BLADE SURG SZ10 CARB STEEL (BLADE) IMPLANT
CABLE HIGH FREQUENCY MONO STRZ (ELECTRODE) IMPLANT
CHLORAPREP W/TINT 26 (MISCELLANEOUS) ×1 IMPLANT
CLIP APPLIE 5 13 M/L LIGAMAX5 (MISCELLANEOUS) IMPLANT
CLIP APPLIE ROT 10 11.4 M/L (STAPLE) IMPLANT
COVER MAYO STAND STRL (DRAPES) ×1 IMPLANT
COVER SURGICAL LIGHT HANDLE (MISCELLANEOUS) ×1 IMPLANT
CUTTER FLEX LINEAR 45M (STAPLE) IMPLANT
DERMABOND ADVANCED .7 DNX12 (GAUZE/BANDAGES/DRESSINGS) IMPLANT
DRAPE LAPAROSCOPIC ABDOMINAL (DRAPES) ×1 IMPLANT
DRAPE WARM FLUID 44X44 (DRAPES) ×1 IMPLANT
ELECT REM PT RETURN 15FT ADLT (MISCELLANEOUS) ×1 IMPLANT
GAUZE SPONGE 4X4 12PLY STRL (GAUZE/BANDAGES/DRESSINGS) IMPLANT
GLOVE BIO SURGEON STRL SZ7.5 (GLOVE) ×1 IMPLANT
GOWN STRL REUS W/ TWL XL LVL3 (GOWN DISPOSABLE) ×1 IMPLANT
HANDLE SUCTION POOLE (INSTRUMENTS) ×1 IMPLANT
IRRIGATION SUCT STRKRFLW 2 WTP (MISCELLANEOUS) IMPLANT
KIT BASIN OR (CUSTOM PROCEDURE TRAY) ×1 IMPLANT
KIT TURNOVER KIT A (KITS) IMPLANT
LIGASURE IMPACT 36 18CM CVD LR (INSTRUMENTS) IMPLANT
NS IRRIG 1000ML POUR BTL (IV SOLUTION) ×1 IMPLANT
PACK GENERAL/GYN (CUSTOM PROCEDURE TRAY) ×1 IMPLANT
PENCIL SMOKE EVACUATOR (MISCELLANEOUS) ×1 IMPLANT
RELOAD PROXIMATE 75MM BLUE (ENDOMECHANICALS) IMPLANT
RELOAD STAPLE 45 3.5 BLU ETS (ENDOMECHANICALS) IMPLANT
RELOAD STAPLE 75 3.8 BLU REG (ENDOMECHANICALS) IMPLANT
RELOAD STAPLE TA45 3.5 REG BLU (ENDOMECHANICALS) ×1 IMPLANT
SCISSORS LAP 5X35 DISP (ENDOMECHANICALS) IMPLANT
SET TUBE SMOKE EVAC HIGH FLOW (TUBING) ×1 IMPLANT
SHEARS HARMONIC 36 ACE (MISCELLANEOUS) IMPLANT
SLEEVE Z-THREAD 5X100MM (TROCAR) IMPLANT
SPIKE FLUID TRANSFER (MISCELLANEOUS) ×1 IMPLANT
STAPLER GUN LINEAR PROX 60 (STAPLE) IMPLANT
STAPLER PROXIMATE 75MM BLUE (STAPLE) IMPLANT
STAPLER SKIN PROX WIDE 3.9 (STAPLE) IMPLANT
STRIP CLOSURE SKIN 1/2X4 (GAUZE/BANDAGES/DRESSINGS) IMPLANT
SUT MNCRL AB 4-0 PS2 18 (SUTURE) IMPLANT
SUT PDS AB 1 TP1 96 (SUTURE) IMPLANT
SUT PROLENE 2 0 KS (SUTURE) IMPLANT
SUT PROLENE 2 0 SH DA (SUTURE) IMPLANT
SUT SILK 2 0 SH CR/8 (SUTURE) ×1 IMPLANT
SUT SILK 2-0 18XBRD TIE 12 (SUTURE) ×1 IMPLANT
SUT SILK 3 0 SH CR/8 (SUTURE) ×1 IMPLANT
SUT SILK 3-0 18XBRD TIE 12 (SUTURE) IMPLANT
SYSTEM BAG RETRIEVAL 10MM (BASKET) IMPLANT
SYSTEM LAPSCP GELPORT 120MM (MISCELLANEOUS) IMPLANT
TOWEL OR 17X26 10 PK STRL BLUE (TOWEL DISPOSABLE) ×1 IMPLANT
TRAY FOLEY MTR SLVR 16FR STAT (SET/KITS/TRAYS/PACK) ×1 IMPLANT
TRAY LAPAROSCOPIC (CUSTOM PROCEDURE TRAY) ×1 IMPLANT
TROCAR 11X100 Z THREAD (TROCAR) IMPLANT
TROCAR ADV FIXATION 12X100MM (TROCAR) IMPLANT
TROCAR BALLN 12MMX100 BLUNT (TROCAR) IMPLANT
TROCAR XCEL NON-BLD 5MMX100MML (ENDOMECHANICALS) IMPLANT
TROCAR Z-THREAD OPTICAL 5X100M (TROCAR) ×1 IMPLANT
TROCAR Z-THREAD SLEEVE 11X100 (TROCAR) IMPLANT
YANKAUER SUCT BULB TIP 10FT TU (MISCELLANEOUS) IMPLANT

## 2023-11-25 NOTE — Plan of Care (Signed)

## 2023-11-25 NOTE — Transfer of Care (Signed)
 Immediate Anesthesia Transfer of Care Note  Patient: Shawna Waters  Procedure(s) Performed: LAPAROSCOPY, DIAGNOSTIC, LYSIS OF ADHESIONS, APPENDECTOMY  Patient Location: PACU  Anesthesia Type:General  Level of Consciousness: awake, drowsy, and responds to stimulation  Airway & Oxygen Therapy: Patient Spontanous Breathing and Patient connected to face mask oxygen  Post-op Assessment: Report given to RN and Post -op Vital signs reviewed and stable  Post vital signs: Reviewed and stable  Last Vitals:  Vitals Value Taken Time  BP 149/79 11/25/23 1240  Temp 36.5 C 11/25/23 1240  Pulse 75 11/25/23 1245  Resp 24 11/25/23 1245  SpO2 100 % 11/25/23 1245  Vitals shown include unfiled device data.  Last Pain:  Vitals:   11/25/23 1240  TempSrc:   PainSc: 0-No pain      Patients Stated Pain Goal: 0 (11/22/23 2051)  Complications: No notable events documented.

## 2023-11-25 NOTE — Progress Notes (Signed)
 WhatsApp yeah that have  PROGRESS NOTE    Shawna Waters  WUJ:[id number] DOB: November 07, 1955 DOA: 11/22/2023 PCP: Patient, No Pcp Per   Brief Narrative:  68 year old female with history of essential hypertension, depression, hysterectomy, cesarean section, unspecified stroke/TIA on Plavix  has been admitted with small bowel obstruction.  General surgery has been consulted.  She currently has an NG tube.  Assessment & Plan:   Small bowel obstruction -Possibly due to adhesions. - General Surgery following.  Continue NG tube.  Continue IV fluids, n.p.o., pain management.    Essential hypertension--blood pressure currently stable.  Continue IV hydralazine  as needed  History of unspecified CVA -Plavix  on hold.  Outpatient follow-up with PCP and/or neurology    DVT prophylaxis: Lovenox  Code Status: Full Family Communication: Husband at bedside Disposition Plan: Status is: Inpatient Remains inpatient appropriate because: Of severity of illness    Consultants: General Surgery  Procedures: None  Antimicrobials: None   Subjective: Patient seen and examined at bedside.  Has not had a bowel movement yet.  Continues to have intermittent abdominal pain with nausea.  No fever reported. Objective: Vitals:   11/24/23 0414 11/24/23 1223 11/24/23 1942 11/25/23 0547  BP: 132/74 (!) 168/82 (!) 140/74 (!) 169/83  Pulse: 81 72 84 79  Resp:  16 20 19   Temp: 98.1 F (36.7 C) 98.4 F (36.9 C) 98.9 F (37.2 C) 98.6 F (37 C)  TempSrc:   Oral Oral  SpO2: 91% 93% 94% 93%  Weight:      Height:        Intake/Output Summary (Last 24 hours) at 11/25/2023 0816 Last data filed at 11/25/2023 0500 Gross per 24 hour  Intake 1514.08 ml  Output 1750 ml  Net -235.92 ml   Filed Weights   11/22/23 1024  Weight: 79.6 kg    Examination:  General: Currently on room air.  No distress.  NG tube is present respiratory: Decreased breath sounds at bases bilaterally with some crackles CVS: Currently rate  controlled; S1-S2 heard  abdominal: Soft, slightly tender, slightly distended, no organomegaly; sluggish bowel sounds  extremities: Trace lower extremity edema; no clubbing.     Data Reviewed: I have personally reviewed following labs and imaging studies  CBC: Recent Labs  Lab 11/22/23 0110 11/23/23 0405  WBC 6.6 7.9  HGB 12.8 13.9  HCT 36.7 45.5  MCV 93.6 104.6*  PLT 168 199   Basic Metabolic Panel: Recent Labs  Lab 11/22/23 0110 11/23/23 0405 11/23/23 0440 11/24/23 0414  NA 137 141  --  144  K 3.7 3.7  --  4.0  CL 101 108  --  110  CO2 25 21*  --  25  GLUCOSE 122* 91  --  86  BUN 18 18  --  26*  CREATININE 1.00 0.91  --  1.01*  CALCIUM  9.6 9.0  --  9.0  MG  --   --  2.1  --    GFR: Estimated Creatinine Clearance: 54.8 mL/min (A) (by C-G formula based on SCr of 1.01 mg/dL (H)). Liver Function Tests: Recent Labs  Lab 11/22/23 0110  AST 26  ALT 12  ALKPHOS 88  BILITOT 0.5  PROT 6.8  ALBUMIN  4.1   Recent Labs  Lab 11/22/23 0110  LIPASE 21   No results for input(s): "AMMONIA" in the last 168 hours. Coagulation Profile: No results for input(s): "INR", "PROTIME" in the last 168 hours. Cardiac Enzymes: No results for input(s): "CKTOTAL", "CKMB", "CKMBINDEX", "TROPONINI" in the last 168 hours.  BNP (last 3 results) No results for input(s): "PROBNP" in the last 8760 hours. HbA1C: No results for input(s): "HGBA1C" in the last 72 hours. CBG: No results for input(s): "GLUCAP" in the last 168 hours. Lipid Profile: No results for input(s): "CHOL", "HDL", "LDLCALC", "TRIG", "CHOLHDL", "LDLDIRECT" in the last 72 hours. Thyroid  Function Tests: No results for input(s): "TSH", "T4TOTAL", "FREET4", "T3FREE", "THYROIDAB" in the last 72 hours. Anemia Panel: No results for input(s): "VITAMINB12", "FOLATE", "FERRITIN", "TIBC", "IRON", "RETICCTPCT" in the last 72 hours. Sepsis Labs: No results for input(s): "PROCALCITON", "LATICACIDVEN" in the last 168 hours.  No  results found for this or any previous visit (from the past 240 hours).       Radiology Studies: DG Abd Portable 1V Result Date: 11/25/2023 CLINICAL DATA:  Small bowel obstruction. EXAM: PORTABLE ABDOMEN - 1 VIEW COMPARISON:  11/24/2023 FINDINGS: NG tube tip is in the mid stomach with proximal side port below the GE junction. Mild gaseous distention of stomach noted. Gaseous distention of small bowel persists measuring up to about 3.6 cm maximum diameter. No colonic gas visible. IMPRESSION: Persistent gaseous distention of small bowel compatible with small bowel obstruction. Electronically Signed   By: Donnal Fusi M.D.   On: 11/25/2023 08:13   DG Abd Portable 1V Result Date: 11/24/2023 CLINICAL DATA:  [id number] SBO (small bowel obstruction) (HCC) [id number] EXAM: PORTABLE ABDOMEN - 1 VIEW COMPARISON:  Multiple, most recently November 23, 2023 FINDINGS: Unchanged dilation of the small bowel in the left abdomen measuring 3.8 cm. The enteric contrast is less conspicuous on today's study. Minimal gaseous within the proximal colon. No pneumoperitoneum partially visualized esophagogastric tube in the epigastrium. IMPRESSION: Persistent dilation of the small bowel in the left hemiabdomen. Electronically Signed   By: Rance Burrows M.D.   On: 11/24/2023 09:09   DG Abd Portable 1V Result Date: 11/23/2023 CLINICAL DATA:  Nasogastric tube placement. EXAM: PORTABLE ABDOMEN - 1 VIEW COMPARISON:  Same day FINDINGS: Distal tip of nasogastric tube is seen in expected position of distal stomach. Mildly dilated small bowel loops are noted concerning for possible obstruction. No colonic dilatation is noted. IMPRESSION: Nasogastric tube tip seen in expected position of distal stomach. Small bowel dilatation is again noted as described above. Electronically Signed   By: Rosalene Colon M.D.   On: 11/23/2023 11:12        Scheduled Meds:  enoxaparin  (LOVENOX ) injection  40 mg Subcutaneous Q24H   Continuous Infusions:  0.9  % NaCl with KCl 20 mEq / L 50 mL/hr at 11/24/23 1408   promethazine  (PHENERGAN ) injection (IM or IVPB) 12.5 mg (11/23/23 2032)          Audria Leather, MD [facility] 11/25/2023, 8:16 AM

## 2023-11-25 NOTE — Anesthesia Procedure Notes (Signed)
 Procedure Name: Intubation Date/Time: 11/25/2023 11:28 AM  Performed by: [place], Avi Lemma, CRNAPre-anesthesia Checklist: Patient identified, Emergency Drugs available, Suction available, Patient being monitored and Timeout performed Patient Re-evaluated:Patient Re-evaluated prior to induction Oxygen Delivery Method: Circle system utilized Preoxygenation: Pre-oxygenation with 100% oxygen Induction Type: IV induction and Rapid sequence Laryngoscope Size: Mac and 3 Grade View: Grade II Tube type: Oral Tube size: 7.0 mm Number of attempts: 1 Airway Equipment and Method: Stylet Placement Confirmation: ETT inserted through vocal cords under direct vision, positive ETCO2 and breath sounds checked- equal and bilateral Secured at: 22 cm Tube secured with: Tape Dental Injury: Teeth and Oropharynx as per pre-operative assessment

## 2023-11-25 NOTE — Progress Notes (Signed)
 Subjective/Chief Complaint: Patient denies abdominal pain.  Reports that she may have passed a little flatus NG tube is put out greater than a liter of fluid   Objective: Vital signs in last 24 hours: Temp:  [98.4 F (36.9 C)-98.9 F (37.2 C)] 98.6 F (37 C) (05/01 0547) Pulse Rate:  [72-84] 79 (05/01 0547) Resp:  [16-20] 19 (05/01 0547) BP: (140-169)/(74-83) 169/83 (05/01 0547) SpO2:  [93 %-94 %] 93 % (05/01 0547) Last BM Date : 11/21/23  Intake/Output from previous day: 04/30 0701 - 05/01 0700 In: 1514.1 [I.V.:1514.1] Out: 1750 [Urine:750; Emesis/NG output:1000] Intake/Output this shift: No intake/output data recorded.  Exam: Appears comfortable Abdomen still with some distension, mild tenderness  Lab Results:  Recent Labs    11/23/23 0405  WBC 7.9  HGB 13.9  HCT 45.5  PLT 199   BMET Recent Labs    11/23/23 0405 11/24/23 0414  NA 141 144  K 3.7 4.0  CL 108 110  CO2 21* 25  GLUCOSE 91 86  BUN 18 26*  CREATININE 0.91 1.01*  CALCIUM  9.0 9.0   PT/INR No results for input(s): "LABPROT", "INR" in the last 72 hours. ABG No results for input(s): "PHART", "HCO3" in the last 72 hours.  Invalid input(s): "PCO2", "PO2"  Studies/Results: DG Abd Portable 1V Result Date: 11/25/2023 CLINICAL DATA:  Small bowel obstruction. EXAM: PORTABLE ABDOMEN - 1 VIEW COMPARISON:  11/24/2023 FINDINGS: NG tube tip is in the mid stomach with proximal side port below the GE junction. Mild gaseous distention of stomach noted. Gaseous distention of small bowel persists measuring up to about 3.6 cm maximum diameter. No colonic gas visible. IMPRESSION: Persistent gaseous distention of small bowel compatible with small bowel obstruction. Electronically Signed   By: Donnal Fusi M.D.   On: 11/25/2023 08:13   DG Abd Portable 1V Result Date: 11/24/2023 CLINICAL DATA:  [id number] SBO (small bowel obstruction) (HCC) [id number] EXAM: PORTABLE ABDOMEN - 1 VIEW COMPARISON:  Multiple, most recently  November 23, 2023 FINDINGS: Unchanged dilation of the small bowel in the left abdomen measuring 3.8 cm. The enteric contrast is less conspicuous on today's study. Minimal gaseous within the proximal colon. No pneumoperitoneum partially visualized esophagogastric tube in the epigastrium. IMPRESSION: Persistent dilation of the small bowel in the left hemiabdomen. Electronically Signed   By: Rance Burrows M.D.   On: 11/24/2023 09:09   DG Abd Portable 1V Result Date: 11/23/2023 CLINICAL DATA:  Nasogastric tube placement. EXAM: PORTABLE ABDOMEN - 1 VIEW COMPARISON:  Same day FINDINGS: Distal tip of nasogastric tube is seen in expected position of distal stomach. Mildly dilated small bowel loops are noted concerning for possible obstruction. No colonic dilatation is noted. IMPRESSION: Nasogastric tube tip seen in expected position of distal stomach. Small bowel dilatation is again noted as described above. Electronically Signed   By: Rosalene Colon M.D.   On: 11/23/2023 11:12    Anti-infectives: Anti-infectives (From admission, onward)    Start     Dose/Rate Route Frequency Ordered Stop   11/25/23 0945  ceFAZolin  (ANCEF ) IVPB 2g/100 mL premix        2 g 200 mL/hr over 30 Minutes Intravenous  Once 11/25/23 4782         Assessment/Plan: Small bowel obstruction  She has failed to improve despite conservative management.  Her small bowel remains dilated on abdominal x-rays.  She is still having high NG output. She has not improved after 3 days ago and further management.  I recommend proceeding  to the operating room.  We discussed proceeding with a diagnostic laparoscopy and possible laparotomy as this is likely caused by adhesions.  We discussed the risks which includes but is not limited to bleeding, infection, injury to surrounding structures including the intestines, the need to convert to an open procedure, the need for bowel resection, cardiopulmonary issues, DVT, etc.  After discussion, she and her  husband agreed to proceed with surgery which is scheduled.   Shawna Waters 11/25/2023

## 2023-11-25 NOTE — Anesthesia Preprocedure Evaluation (Signed)
 Anesthesia Evaluation    Airway Mallampati: II  TM Distance: >3 FB Neck ROM: Full    Dental no notable dental hx.    Pulmonary    Pulmonary exam normal breath sounds clear to auscultation       Cardiovascular hypertension, Pt. on medications Normal cardiovascular exam Rhythm:Regular Rate:Normal     Neuro/Psych    Depression    TIACVA    GI/Hepatic   Endo/Other    Renal/GU      Musculoskeletal   Abdominal  (+) + obese  Peds  Hematology   Anesthesia Other Findings   Reproductive/Obstetrics                             Anesthesia Physical Anesthesia Plan  ASA: 3  Anesthesia Plan: General   Post-op Pain Management:    Induction: Intravenous, Rapid sequence and Cricoid pressure planned  PONV Risk Score and Plan: 3 and Ondansetron , Dexamethasone , Midazolam  and Treatment may vary due to age or medical condition  Airway Management Planned: Oral ETT  Additional Equipment:   Intra-op Plan:   Post-operative Plan: Extubation in OR  Informed Consent: I have reviewed the patients History and Physical, chart, labs and discussed the procedure including the risks, benefits and alternatives for the proposed anesthesia with the patient or authorized representative who has indicated his/her understanding and acceptance.     Dental advisory given  Plan Discussed with: CRNA  Anesthesia Plan Comments:        Anesthesia Quick Evaluation

## 2023-11-25 NOTE — Op Note (Signed)
 Shawna Waters 11/22/2023 - 11/25/2023   Pre-op Diagnosis: SMALL BOWEL OBSTRUCTION     Post-op Diagnosis: same  Procedure(s): DIAGNOSTIC LAPAROSCOPY LYSIS OF ADHESIONS APPENDECTOMY  Surgeon(s): Oza Blumenthal, MD  Anesthesia: General  Staff:  Circulator: Dannie Duval, RN; Riojas, [place], RN Scrub Person: Alvie Jolly; Erenest Hatchet  Estimated Blood Loss: Minimal               Specimens: SENT TO PATH  Indications: This is a 68 year old female with a small bowel obstruction that has not improved despite conservative management.  The decision was made to proceed to the operating room for a diagnostic laparoscopy  Findings: The patient was found to have a closed loop small bowel obstruction from a normal-appearing appendix that had come across the bowel and was adhesed to omentum.  Once the obstruction was relieved the small bowel although initially appearing ischemic, appeared more viable.  The decision was made to remove the appendix  Procedure: The patient was brought to the operating room and identified as the correct patient.  She was placed upon in the operating room table and general anesthesia was induced.  Her abdomen was then prepped and draped in the usual sterile fashion.  I made a small incision in the patient's left upper quadrant and then using the 5 mm trocar and Optiview camera I then slowly traversed all levels of the abdominal wall and gain entrance into the peritoneal cavity under direct vision.  Insufflation was then begun.  I evaluated the trocar site and saw no evidence of bowel injury.  I placed 2 more 5 mm trocars in the patient's left mid and left lower quadrant under direct vision.  The patient had omentum adhesed to the abdominal wall.  I took this down from at the midline with the laparoscopic scissors going down to the pelvis.  The patient did have a visible hernia defect along her old C-section and hysterectomy incision in the left lower quadrant.   All contents from it had already been reduced.  I next identified the cause of the obstruction which was the appendix that came anteriorly off the colon and crossed the small bowel going down to the side of the omentum.  This was creating a closed-loop small bowel obstruction.  The small bowel itself was mildly ischemic.  I was able to free the appendix off of the omentum and then had to take down the mesoappendix in order to free up the small bowel.  I was then able to completely free up the small bowel.  I then followed the distal small bowel to the terminal ileum.  I then copiously irrigated the abdomen with normal saline.  Although the small bowel and the closed-loop obstruction appeared mildly ischemic, it began to pick up well and had peristalsis so the decision was made to not perform a resection.  I then elected to go ahead and excise the appendix.  I took down the base of the appendix with the laparoscopic GIA stapler.  I then placed an Endosac and removed it through the incision at the umbilicus.  Prior to this I upsized one of the 5 mm trocar to a 12 mm trocar.  We then thoroughly irrigated the abdomen again with saline.  Hemostasis appeared to be achieved.  Again the small bowel that had been obstructed appeared viable.  All trocars were removed under direct vision and the abdomen was deflated.  All incisions anesthetized with Marcaine  and closed with 4-0 Monocryl sutures.  Dermabond was then applied.  The patient tolerated the procedure well.  All the counts were correct at the end of the procedure.  The patient was then extubated in the operating room and taken in stable condition to the recovery room.          Oza Blumenthal   Date: 11/25/2023  Time: 12:28 PM

## 2023-11-26 ENCOUNTER — Encounter (HOSPITAL_COMMUNITY): Payer: Self-pay | Admitting: Surgery

## 2023-11-26 DIAGNOSIS — K565 Intestinal adhesions [bands], unspecified as to partial versus complete obstruction: Secondary | ICD-10-CM | POA: Diagnosis not present

## 2023-11-26 LAB — CBC
HCT: 36.8 % (ref 36.0–46.0)
Hemoglobin: 11.5 g/dL — ABNORMAL LOW (ref 12.0–15.0)
MCH: 32.4 pg (ref 26.0–34.0)
MCHC: 31.3 g/dL (ref 30.0–36.0)
MCV: 103.7 fL — ABNORMAL HIGH (ref 80.0–100.0)
Platelets: 220 10*3/uL (ref 150–400)
RBC: 3.55 MIL/uL — ABNORMAL LOW (ref 3.87–5.11)
RDW: 12.2 % (ref 11.5–15.5)
WBC: 8.7 10*3/uL (ref 4.0–10.5)
nRBC: 0 % (ref 0.0–0.2)

## 2023-11-26 LAB — BASIC METABOLIC PANEL WITH GFR
Anion gap: 6 (ref 5–15)
BUN: 21 mg/dL (ref 8–23)
CO2: 27 mmol/L (ref 22–32)
Calcium: 8.5 mg/dL — ABNORMAL LOW (ref 8.9–10.3)
Chloride: 111 mmol/L (ref 98–111)
Creatinine, Ser: 0.76 mg/dL (ref 0.44–1.00)
GFR, Estimated: >60 mL/min (ref >60)
Glucose, Bld: 102 mg/dL — ABNORMAL HIGH (ref 70–99)
Potassium: 4.5 mmol/L (ref 3.5–5.1)
Sodium: 144 mmol/L (ref 135–145)

## 2023-11-26 MED ORDER — LIDOCAINE 5 % EX PTCH
1.0000 | MEDICATED_PATCH | CUTANEOUS | Status: DC
  Administered 2023-11-26: 1 via TRANSDERMAL
  Filled 2023-11-26: qty 1

## 2023-11-26 MED ORDER — FENTANYL CITRATE PF 50 MCG/ML IJ SOSY: 25.0000 ug | PREFILLED_SYRINGE | INTRAMUSCULAR | Status: DC | PRN

## 2023-11-26 MED ORDER — TRAMADOL HCL 50 MG PO TABS: 50.0000 mg | ORAL_TABLET | Freq: Four times a day (QID) | ORAL | Status: DC | PRN

## 2023-11-26 MED ORDER — METHOCARBAMOL 1000 MG/10ML IJ SOLN
500.0000 mg | Freq: Three times a day (TID) | INTRAMUSCULAR | Status: DC
  Administered 2023-11-26 – 2023-11-27 (×3): 500 mg via INTRAVENOUS
  Filled 2023-11-26 (×3): qty 10

## 2023-11-26 NOTE — Plan of Care (Signed)
  Problem: Education: Goal: Knowledge of General Education information will improve Description: Including pain rating scale, medication(s)/side effects and non-pharmacologic comfort measures Outcome: Progressing   Problem: Clinical Measurements: Goal: Will remain free from infection Outcome: Progressing   Problem: Nutrition: Goal: Adequate nutrition will be maintained Outcome: Progressing   Problem: Coping: Goal: Level of anxiety will decrease Outcome: Progressing   Problem: Elimination: Goal: Will not experience complications related to bowel motility Outcome: Progressing Goal: Will not experience complications related to urinary retention Outcome: Progressing   Problem: Pain Managment: Goal: General experience of comfort will improve and/or be controlled Outcome: Progressing   Problem: Safety: Goal: Ability to remain free from injury will improve Outcome: Progressing   Problem: Skin Integrity: Goal: Risk for impaired skin integrity will decrease Outcome: Progressing

## 2023-11-26 NOTE — Progress Notes (Signed)
 [facility] Progress Note  1 Day Post-Op  Subjective: CC:  Abdomen is sore but she feels better. Does not like morphine  or dilaudid . Says she thinks she is passing gas but is unsure. No BM yet. Walked in the hall last night. Foley remains in place.   Objective: Vital signs in last 24 hours: Temp:  [97.6 F (36.4 C)-98.3 F (36.8 C)] 98 F (36.7 C) (05/02 0347) Pulse Rate:  [65-80] 70 (05/02 0347) Resp:  [16-24] 20 (05/02 0347) BP: (130-155)/(63-79) 139/67 (05/02 0347) SpO2:  [93 %-100 %] 98 % (05/02 0347) Weight:  [79.6 kg] 79.6 kg (05/01 1038) Last BM Date : 11/22/23  Intake/Output from previous day: 05/01 0701 - 05/02 0700 In: 1250 [I.V.:1000; IV Piggyback:250] Out: 1115 [Urine:1000; Emesis/NG output:100; Blood:15] Intake/Output this shift: Total I/O In: -  Out: 300 [Urine:300]  PE: Gen:  Alert, NAD, pleasant and cooperative Card:  Regular rate and rhythm Pulm:  Normal effort ORA Abd: Soft, appropraitely tender, laparoscopic port sites c/d/I with skin glue, mild distention, no peritonitis or guarding GU: foley in place draining clear yellow urine Skin: warm and dry, no rashes  Psych: A&Ox3   Lab Results:  Recent Labs    11/26/23 0417  WBC 8.7  HGB 11.5*  HCT 36.8  PLT 220   BMET Recent Labs    11/24/23 0414 11/26/23 0417  NA 144 144  K 4.0 4.5  CL 110 111  CO2 25 27  GLUCOSE 86 102*  BUN 26* 21  CREATININE 1.01* 0.76  CALCIUM  9.0 8.5*   PT/INR No results for input(s): "LABPROT", "INR" in the last 72 hours. CMP     Component Value Date/Time   NA 144 11/26/2023 0417   NA 141 10/29/2017 0909   K 4.5 11/26/2023 0417   CL 111 11/26/2023 0417   CO2 27 11/26/2023 0417   GLUCOSE 102 (H) 11/26/2023 0417   BUN 21 11/26/2023 0417   BUN 12 10/29/2017 0909   CREATININE 0.76 11/26/2023 0417   CREATININE 0.81 04/21/2016 0855   CALCIUM  8.5 (L) 11/26/2023 0417   PROT 6.8 11/22/2023 0110   PROT 6.5 10/29/2017 0909   ALBUMIN  4.1 11/22/2023  0110   ALBUMIN  3.9 10/29/2017 0909   AST 26 11/22/2023 0110   ALT 12 11/22/2023 0110   ALKPHOS 88 11/22/2023 0110   BILITOT 0.5 11/22/2023 0110   BILITOT 0.5 10/29/2017 0909   GFRNONAA >60 11/26/2023 0417   GFRNONAA 54 (L) 10/10/2014 1234   GFRAA 84 10/29/2017 0909   GFRAA 62 10/10/2014 1234   Lipase     Component Value Date/Time   LIPASE 21 11/22/2023 0110       Studies/Results: DG Abd Portable 1V Result Date: 11/25/2023 CLINICAL DATA:  Small bowel obstruction. EXAM: PORTABLE ABDOMEN - 1 VIEW COMPARISON:  11/24/2023 FINDINGS: NG tube tip is in the mid stomach with proximal side port below the GE junction. Mild gaseous distention of stomach noted. Gaseous distention of small bowel persists measuring up to about 3.6 cm maximum diameter. No colonic gas visible. IMPRESSION: Persistent gaseous distention of small bowel compatible with small bowel obstruction. Electronically Signed   By: Donnal Fusi M.D.   On: 11/25/2023 08:13    Anti-infectives: Anti-infectives (From admission, onward)    Start     Dose/Rate Route Frequency Ordered Stop   11/25/23 1030  ceFAZolin  (ANCEF ) IVPB 2g/100 mL premix        2 g 200 mL/hr over 30 Minutes Intravenous  Once 11/25/23 1478  11/25/23 1130        Assessment/Plan  pSBO 68 y/o F who presents with 5 days of abdominal pain that acutely worsened 4/28.  - CT scan of the abdomen and pelvis 4/28 shows partial small bowel obstruction as well as a fat containing abdominal wall hernia. Her hernia does not appear to be the point of her bowel obstruction. I was not able to palpate this hernia during my exam.   - failed non-operative mgmt with NGT decompression and SBO protocol  POD#1 S/P diagnostic laparoscopy, LOA, appendectomy 5/1 Dr. Lucienne Ryder  - intra-op findings: normal appearing appendix adhered to bowel causing closed loop SBO - low NGT output, having flatus, clamp NGT this AM and allow sips with meds ice chips - D/C foley today, TOV - adjust  pain meds: continue tylenol , add robaxin , lidoderm  patch, PRN tramadol, IV fentanyl  for breakthrough - consider resumption of plavix  tomorrow 5/3 if hgb stable  FEN - clamp NGT, NPO. ok for small amts ice chips and 2 popsicles today for comfort VTE - SCD's, lovenox  ID - none indicated Admit - [facility] service; discussed with hospitalist, we will take over as primary and they will follow   Below per [facility] - Hx CVA - pt denies deficits from this, does report issues with speech when she has severe anxiety; please hold plavix  HTN - on Benicar at home, ok for sips w/ meds here HLD - on statin tx Anxiety/depression - wellbutrin  XL 150 mg at home    LOS: 3 days   I reviewed nursing notes, hospitalist notes, last 24 h vitals and pain scores, last 48 h intake and output, last 24 h labs and trends, and last 24 h imaging results.  This care required moderate level of medical decision making.   Michial Akin, PA-C [facility] Please see Amion for pager number during day hours 7:00am-4:30pm

## 2023-11-26 NOTE — Anesthesia Postprocedure Evaluation (Signed)
 Anesthesia Post Note  Patient: Shawna Waters  Procedure(s) Performed: LAPAROSCOPY, DIAGNOSTIC, LYSIS OF ADHESIONS, APPENDECTOMY     Patient location during evaluation: PACU Anesthesia Type: General Level of consciousness: awake and alert Pain management: pain level controlled Vital Signs Assessment: post-procedure vital signs reviewed and stable Respiratory status: spontaneous breathing, nonlabored ventilation and respiratory function stable Cardiovascular status: blood pressure returned to baseline and stable Postop Assessment: no apparent nausea or vomiting Anesthetic complications: no   No notable events documented.  Last Vitals:  Vitals:   11/25/23 1930 11/26/23 0347  BP: 130/63 139/67  Pulse: 65 70  Resp: 18 20  Temp: 36.4 C 36.7 C  SpO2: 98% 98%    Last Pain:  Vitals:   11/26/23 0347  TempSrc: Oral  PainSc:                  Earvin Goldberg

## 2023-11-26 NOTE — Plan of Care (Signed)
 Patient ambulated 400 ft. Tolerated well.   Problem: Education: Goal: Knowledge of General Education information will improve Description: Including pain rating scale, medication(s)/side effects and non-pharmacologic comfort measures Outcome: Progressing   Problem: Health Behavior/Discharge Planning: Goal: Ability to manage health-related needs will improve Outcome: Progressing   Problem: Clinical Measurements: Goal: Ability to maintain clinical measurements within normal limits will improve Outcome: Progressing Goal: Will remain free from infection Outcome: Progressing Goal: Diagnostic test results will improve Outcome: Progressing Goal: Respiratory complications will improve Outcome: Progressing Goal: Cardiovascular complication will be avoided Outcome: Progressing   Problem: Activity: Goal: Risk for activity intolerance will decrease Outcome: Progressing   Problem: Nutrition: Goal: Adequate nutrition will be maintained Outcome: Progressing   Problem: Coping: Goal: Level of anxiety will decrease Outcome: Progressing   Problem: Elimination: Goal: Will not experience complications related to bowel motility Outcome: Progressing Goal: Will not experience complications related to urinary retention Outcome: Progressing   Problem: Pain Managment: Goal: General experience of comfort will improve and/or be controlled Outcome: Progressing   Problem: Safety: Goal: Ability to remain free from injury will improve Outcome: Progressing   Problem: Skin Integrity: Goal: Risk for impaired skin integrity will decrease Outcome: Progressing

## 2023-11-26 NOTE — Progress Notes (Signed)
 WhatsApp yeah that have  PROGRESS NOTE    Shawna Waters  QMV:[id number] DOB: July 26, 1956 DOA: 11/22/2023 PCP: Patient, No Pcp Per   Brief Narrative:  68 year old female with history of essential hypertension, depression, hysterectomy, cesarean section, unspecified stroke/TIA on Plavix  has been admitted with small bowel obstruction.  General surgery has been consulted.  She underwent surgical intervention with diagnostic laparoscopy, lysis of adhesions and appendectomy on 11/25/2023.  Assessment & Plan:   Small bowel obstruction -Possibly due to adhesions. - General Surgery following.  She underwent surgical intervention with diagnostic laparoscopy, lysis of adhesions and appendectomy on 11/25/2023.  Continue IV fluids, n.p.o., pain management.  Wound care and diet advancement as per general surgery.  General surgery will take over as primary team.  Essential hypertension--blood pressure currently stable.  Continue IV hydralazine  as needed  History of unspecified CVA -Plavix  on hold.  Outpatient follow-up with PCP and/or neurology    DVT prophylaxis: Lovenox  Code Status: Full Family Communication: Husband at bedside Disposition Plan: Status is: Inpatient Remains inpatient appropriate because: Of severity of illness    Consultants: General Surgery  Procedures: As above Antimicrobials: Perioperative   Subjective: Patient seen and examined at bedside.  Complains of intermittent abdominal pain.  No chest pain, shortness of breath or fever reported.   Objective: Vitals:   11/25/23 1330 11/25/23 1350 11/25/23 1930 11/26/23 0347  BP: (!) 144/74 (!) 155/78 130/63 139/67  Pulse: 74 74 65 70  Resp: 19 18 18 20   Temp: 98 F (36.7 C) 98.3 F (36.8 C) 97.6 F (36.4 C) 98 F (36.7 C)  TempSrc:   Oral Oral  SpO2: 97% 99% 98% 98%  Weight:      Height:        Intake/Output Summary (Last 24 hours) at 11/26/2023 0818 Last data filed at 11/26/2023 0610 Gross per 24 hour  Intake 1250 ml   Output 1115 ml  Net 135 ml   Filed Weights   11/22/23 1024 11/25/23 1038  Weight: 79.6 kg 79.6 kg    Examination:  General: No acute distress.  Remains on room air.  NG tube present. respiratory: Bilateral decreased breath sounds at bases with scattered crackles  CVS: S1-S2; rate mostly controlled abdominal: Soft, mildly distended and tender, no organomegaly; bowel sounds are sluggish extremities: No cyanosis.  Mild lower extremity edema present   Data Reviewed: I have personally reviewed following labs and imaging studies  CBC: Recent Labs  Lab 11/22/23 0110 11/23/23 0405 11/26/23 0417  WBC 6.6 7.9 8.7  HGB 12.8 13.9 11.5*  HCT 36.7 45.5 36.8  MCV 93.6 104.6* 103.7*  PLT 168 199 220   Basic Metabolic Panel: Recent Labs  Lab 11/22/23 0110 11/23/23 0405 11/23/23 0440 11/24/23 0414 11/26/23 0417  NA 137 141  --  144 144  K 3.7 3.7  --  4.0 4.5  CL 101 108  --  110 111  CO2 25 21*  --  25 27  GLUCOSE 122* 91  --  86 102*  BUN 18 18  --  26* 21  CREATININE 1.00 0.91  --  1.01* 0.76  CALCIUM  9.6 9.0  --  9.0 8.5*  MG  --   --  2.1  --   --    GFR: Estimated Creatinine Clearance: 69.1 mL/min (by C-G formula based on SCr of 0.76 mg/dL). Liver Function Tests: Recent Labs  Lab 11/22/23 0110  AST 26  ALT 12  ALKPHOS 88  BILITOT 0.5  PROT 6.8  ALBUMIN  4.1   Recent Labs  Lab 11/22/23 0110  LIPASE 21   No results for input(s): "AMMONIA" in the last 168 hours. Coagulation Profile: No results for input(s): "INR", "PROTIME" in the last 168 hours. Cardiac Enzymes: No results for input(s): "CKTOTAL", "CKMB", "CKMBINDEX", "TROPONINI" in the last 168 hours. BNP (last 3 results) No results for input(s): "PROBNP" in the last 8760 hours. HbA1C: No results for input(s): "HGBA1C" in the last 72 hours. CBG: No results for input(s): "GLUCAP" in the last 168 hours. Lipid Profile: No results for input(s): "CHOL", "HDL", "LDLCALC", "TRIG", "CHOLHDL", "LDLDIRECT" in  the last 72 hours. Thyroid  Function Tests: No results for input(s): "TSH", "T4TOTAL", "FREET4", "T3FREE", "THYROIDAB" in the last 72 hours. Anemia Panel: No results for input(s): "VITAMINB12", "FOLATE", "FERRITIN", "TIBC", "IRON", "RETICCTPCT" in the last 72 hours. Sepsis Labs: No results for input(s): "PROCALCITON", "LATICACIDVEN" in the last 168 hours.  No results found for this or any previous visit (from the past 240 hours).       Radiology Studies: DG Abd Portable 1V Result Date: 11/25/2023 CLINICAL DATA:  Small bowel obstruction. EXAM: PORTABLE ABDOMEN - 1 VIEW COMPARISON:  11/24/2023 FINDINGS: NG tube tip is in the mid stomach with proximal side port below the GE junction. Mild gaseous distention of stomach noted. Gaseous distention of small bowel persists measuring up to about 3.6 cm maximum diameter. No colonic gas visible. IMPRESSION: Persistent gaseous distention of small bowel compatible with small bowel obstruction. Electronically Signed   By: Donnal Fusi M.D.   On: 11/25/2023 08:13        Scheduled Meds:  enoxaparin  (LOVENOX ) injection  40 mg Subcutaneous Q24H   Continuous Infusions:  0.9 % NaCl with KCl 20 mEq / L 75 mL/hr at 11/26/23 0400   promethazine  (PHENERGAN ) injection (IM or IVPB) 12.5 mg (11/23/23 2032)          Audria Leather, MD [facility] 11/26/2023, 8:18 AM

## 2023-11-27 DIAGNOSIS — K565 Intestinal adhesions [bands], unspecified as to partial versus complete obstruction: Secondary | ICD-10-CM | POA: Diagnosis not present

## 2023-11-27 LAB — CBC
HCT: 35.1 % — ABNORMAL LOW (ref 36.0–46.0)
Hemoglobin: 11.4 g/dL — ABNORMAL LOW (ref 12.0–15.0)
MCH: 32.3 pg (ref 26.0–34.0)
MCHC: 32.5 g/dL (ref 30.0–36.0)
MCV: 99.4 fL (ref 80.0–100.0)
Platelets: 215 10*3/uL (ref 150–400)
RBC: 3.53 MIL/uL — ABNORMAL LOW (ref 3.87–5.11)
RDW: 11.9 % (ref 11.5–15.5)
WBC: 6.7 10*3/uL (ref 4.0–10.5)
nRBC: 0 % (ref 0.0–0.2)

## 2023-11-27 LAB — BASIC METABOLIC PANEL WITH GFR
Anion gap: 8 (ref 5–15)
BUN: 17 mg/dL (ref 8–23)
CO2: 25 mmol/L (ref 22–32)
Calcium: 8.6 mg/dL — ABNORMAL LOW (ref 8.9–10.3)
Chloride: 107 mmol/L (ref 98–111)
Creatinine, Ser: 0.77 mg/dL (ref 0.44–1.00)
GFR, Estimated: >60 mL/min (ref >60)
Glucose, Bld: 92 mg/dL (ref 70–99)
Potassium: 4.2 mmol/L (ref 3.5–5.1)
Sodium: 140 mmol/L (ref 135–145)

## 2023-11-27 LAB — MAGNESIUM: Magnesium: 2 mg/dL (ref 1.7–2.4)

## 2023-11-27 MED ORDER — TRAMADOL HCL 50 MG PO TABS: 50.0000 mg | ORAL_TABLET | Freq: Four times a day (QID) | ORAL | 0 refills | Status: AC | PRN

## 2023-11-27 NOTE — Progress Notes (Signed)
 Pt and family at bedside received and verbalized understanding of discharge instructions.  Pt will shower and order breakfast before leaving.

## 2023-11-27 NOTE — Plan of Care (Signed)

## 2023-11-27 NOTE — Discharge Instructions (Signed)
 [facility] ______[facility] LAPAROSCOPIC SURGERY: POST OP INSTRUCTIONS Always review your discharge instruction sheet given to you by the facility where your surgery was performed. IF YOU HAVE DISABILITY OR FAMILY LEAVE FORMS, YOU MUST BRING THEM TO THE OFFICE FOR PROCESSING.   DO NOT GIVE THEM TO YOUR DOCTOR.  A prescription for pain medication may be given to you upon discharge.  Take your pain medication as prescribed, if needed.  If narcotic pain medicine is not needed, then you may take acetaminophen  (Tylenol ) or ibuprofen (Advil) as needed. Take your usually prescribed medications unless otherwise directed. If you need a refill on your pain medication, please contact your pharmacy.  They will contact our office to request authorization. Prescriptions will not be filled after 5pm or on week-ends. You should follow a light diet the first few days after arrival home, such as soup and crackers, etc.  Be sure to include lots of fluids daily. Most patients will experience some swelling and bruising in the area of the incisions.  Ice packs will help.  Swelling and bruising can take several days to resolve.  It is common to experience some constipation if taking pain medication after surgery.  Increasing fluid intake and taking a stool softener (such as Colace) will usually help or prevent this problem from occurring.  A mild laxative (Milk of Magnesia or Miralax) should be taken according to package instructions if there are no bowel movements after 48 hours. Your surgeon used skin glue on the incision, so you may shower in 24 hours.  The glue will flake off over the next 2-3 weeks.   ACTIVITIES:  You may resume regular (light) daily activities beginning the next day--such as daily self-care, walking, climbing stairs--gradually increasing activities as tolerated.  You may have sexual intercourse when it is comfortable.  Refrain from any heavy lifting or straining until approved by your  doctor. You may drive when you are no longer taking prescription pain medication, you can comfortably wear a seatbelt, and you can safely maneuver your car and apply brakes. RETURN TO WORK:  __________________________________________________________ Elene Griffes should see your doctor in the office for a follow-up appointment approximately 2-3 weeks after your surgery.  Make sure that you call for this appointment within a day or two after you arrive home to insure a convenient appointment time. OTHER INSTRUCTIONS: __________________________________________________________________________________________________________________________ __________________________________________________________________________________________________________________________ WHEN TO CALL YOUR DOCTOR: Fever over 101.0 Inability to urinate Continued bleeding from incision. Increased pain, redness, or drainage from the incision. Increasing abdominal pain  The clinic staff is available to answer your questions during regular business hours.  Please don't hesitate to call and ask to speak to one of the nurses for clinical concerns.  If you have a medical emergency, go to the nearest emergency room or call 911.  A surgeon from [facility] is always on call at the hospital. [address], [address], [place], [place]  [address] ? [address] [address], [place], [place]   [address] [phone] ? [phone] ? FAX [phone] Web site: [email or web address]

## 2023-11-27 NOTE — Progress Notes (Signed)
 WhatsApp yeah that have  PROGRESS NOTE    Shawna Waters  ZOX:[id number] DOB: [birth year] DOA: 11/22/2023 PCP: Patient, No Pcp Per   Brief Narrative:  68 year old female with history of essential hypertension, depression, hysterectomy, cesarean section, unspecified stroke/TIA on Plavix  has been admitted with small bowel obstruction.  General surgery has been consulted.  She underwent surgical intervention with diagnostic laparoscopy, lysis of adhesions and appendectomy on 11/25/2023.  Care transferred to general surgery service subsequently.  Assessment & Plan:   Small bowel obstruction -Possibly due to adhesions. - General Surgery following.  She underwent surgical intervention with diagnostic laparoscopy, lysis of adhesions and appendectomy on 11/25/2023.  Wound care and diet advancement as per general surgery.  General surgery took over as primary team after surgery.  Essential hypertension--blood pressure currently stable.  Continue IV hydralazine  as needed.  Can resume home regimen as an outpatient  History of unspecified CVA Hyperlipidemia -Plavix  on hold.  Resume once cleared by general surgery.  Resume statin on discharge.  Outpatient follow-up with PCP and/or neurology   Subjective: Patient seen and examined at bedside.  Feels better.  Tolerating diet.  No fever, vomiting, chest pain reported.  Objective: Vitals:   11/26/23 0347 11/26/23 1325 11/26/23 1948 11/27/23 0327  BP: 139/67 124/70 135/72 (!) 158/66  Pulse: 70 67 68 (!) 58  Resp: 20 15 16 18   Temp: 98 F (36.7 C) 97.8 F (36.6 C) 98 F (36.7 C) 98.4 F (36.9 C)  TempSrc: Oral  Oral   SpO2: 98% 97% 97% 93%  Weight:      Height:        Intake/Output Summary (Last 24 hours) at 11/27/2023 0832 Last data filed at 11/26/2023 1319 Gross per 24 hour  Intake --  Output 200 ml  Net -200 ml   Filed Weights   11/22/23 1024 11/25/23 1038  Weight: 79.6 kg 79.6 kg    Examination:  General: On room air.  No distress.    Respiratory: Decreased breath sounds at bases bilaterally with some crackles  CVS: Mild intermittent bradycardia present; S1 and S2 are heard  abdominal: Soft, slightly distended and tender, no organomegaly; bowel sounds heard  extremities: Trace lower extremity edema present; no clubbing   Data Reviewed: I have personally reviewed following labs and imaging studies  CBC: Recent Labs  Lab 11/22/23 0110 11/23/23 0405 11/26/23 0417 11/27/23 0518  WBC 6.6 7.9 8.7 6.7  HGB 12.8 13.9 11.5* 11.4*  HCT 36.7 45.5 36.8 35.1*  MCV 93.6 104.6* 103.7* 99.4  PLT 168 199 220 215   Basic Metabolic Panel: Recent Labs  Lab 11/22/23 0110 11/23/23 0405 11/23/23 0440 11/24/23 0414 11/26/23 0417 11/27/23 0518  NA 137 141  --  144 144 140  K 3.7 3.7  --  4.0 4.5 4.2  CL 101 108  --  110 111 107  CO2 25 21*  --  25 27 25   GLUCOSE 122* 91  --  86 102* 92  BUN 18 18  --  26* 21 17  CREATININE 1.00 0.91  --  1.01* 0.76 0.77  CALCIUM  9.6 9.0  --  9.0 8.5* 8.6*  MG  --   --  2.1  --   --  2.0   GFR: Estimated Creatinine Clearance: 69.1 mL/min (by C-G formula based on SCr of 0.77 mg/dL). Liver Function Tests: Recent Labs  Lab 11/22/23 0110  AST 26  ALT 12  ALKPHOS 88  BILITOT 0.5  PROT 6.8  ALBUMIN  4.1  Recent Labs  Lab 11/22/23 0110  LIPASE 21   No results for input(s): "AMMONIA" in the last 168 hours. Coagulation Profile: No results for input(s): "INR", "PROTIME" in the last 168 hours. Cardiac Enzymes: No results for input(s): "CKTOTAL", "CKMB", "CKMBINDEX", "TROPONINI" in the last 168 hours. BNP (last 3 results) No results for input(s): "PROBNP" in the last 8760 hours. HbA1C: No results for input(s): "HGBA1C" in the last 72 hours. CBG: No results for input(s): "GLUCAP" in the last 168 hours. Lipid Profile: No results for input(s): "CHOL", "HDL", "LDLCALC", "TRIG", "CHOLHDL", "LDLDIRECT" in the last 72 hours. Thyroid  Function Tests: No results for input(s): "TSH",  "T4TOTAL", "FREET4", "T3FREE", "THYROIDAB" in the last 72 hours. Anemia Panel: No results for input(s): "VITAMINB12", "FOLATE", "FERRITIN", "TIBC", "IRON", "RETICCTPCT" in the last 72 hours. Sepsis Labs: No results for input(s): "PROCALCITON", "LATICACIDVEN" in the last 168 hours.  No results found for this or any previous visit (from the past 240 hours).       Radiology Studies: No results found.       Scheduled Meds:  enoxaparin  (LOVENOX ) injection  40 mg Subcutaneous Q24H   lidocaine   1 patch Transdermal Q24H   methocarbamol  (ROBAXIN ) injection  500 mg Intravenous Q8H   Continuous Infusions:  promethazine  (PHENERGAN ) injection (IM or IVPB) 12.5 mg (11/23/23 2032)          Audria Leather, MD [facility] 11/27/2023, 8:32 AM

## 2023-11-27 NOTE — Discharge Summary (Signed)
 Physician Discharge Summary  Patient ID: SOLMAYRA GILLAN MRN: [id number] DOB/AGE: 08-09-1955 68 y.o.  Admit date: 11/22/2023 Discharge date: 11/27/2023  Admission Diagnoses:  Small bowel obstruction  Discharge Diagnoses: Small bowel obstruction due to adhesions involving appendix Principal Problem:   Small bowel obstruction due to adhesions [facility]) Active Problems:   AKI (acute kidney injury) [facility])   Discharged Condition: good  Hospital Course: Shawna Waters is a 68 y/o F with PMH HTN, HLD, obesity, MDD/anxiety, and CVA in 2016 on Plavix  who presents with abdominal pain. She reports abdominal pain and mild nausea that started Wednesday afternoon. This pain was off and on, associated with poor PO intake, and she saw her PCP Friday who ordered a CT scan. The pain became very severe Sunday night and she had associated vomiting so [facility]. She does report similar, episodic pain, over the last few months that usually resolves on its own in about half a day. This pain is more severe and did not resolve. Other associated symptoms include feeling hot/cold as well as cold-like symptoms with sinus congestion and throat irritation. She reports belching along with flatus. States her last BM was yesterday and was normal/non-bloody. She deneis a known history of bowel obstruction.  She did not improve with conservative treatment, so she underwent diagnostic laparoscopy on 11/25/23.  She was found to have an obstruction secondary to her appendix which was adherent to the omentum, causing a closed loop SBO.  She underwent laparoscopic appendectomy.  Her bowel function has returned and the patient had a bowel movement this morning.  Pain is controlled with minimal pain medication and the patient is eager to go home.   Significant Diagnostic Studies: EXAM: CT ABDOMEN AND PELVIS WITH CONTRAST 11/22/2023 02:28:35 AM   TECHNIQUE: CT of the abdomen and pelvis was performed with  intravenous contrast. Multiplanar reformatted images are provided for review. Automated exposure control, iterative reconstruction, and/or weight based adjustment of the mA/kV was utilized to reduce the radiation dose to as low as reasonably achievable.   COMPARISON: None available.   CLINICAL HISTORY: Abdominal pain, acute, nonlocalized; right sided. Pt BIB [facility] from home with RUQ abdominal pain x 2 weeks with worsening x 2-3 hours. Pt has nausea and vomiting. Pt was given Zofran  4 mg and 500mL LR PTA. Pt is actively vomiting upon arrival.   FINDINGS:   LOWER CHEST: Dependent atelectasis at the lung bases.   HEPATOBILIARY: Liver is unremarkable. Status post cholecystectomy.   SPLEEN: No acute abnormality.   PANCREAS: No acute abnormality.   ADRENAL GLANDS: No acute abnormality.   KIDNEYS, URETERS: No stones in the kidneys or ureters. No evidence of hydronephrosis. No evidence of perinephric or periureteral stranding.   GI AND BOWEL: Dilated small bowel in the central abdomen, suggesting at least partial small bowel obstruction, likely on the basis of adhesions (image 59). Appendix is not clearly visualized.   PELVIS: Trace pelvic ascites.   PERITONEUM AND RETROPERITONEUM: No evidence of free air.   LYMPH NODES: No evidence of lymphadenopathy.   BONES AND SOFT TISSUES: Small left paramidline lower abdominal wall fat-containing hernia with mild stranding (image 77). No acute osseous abnormality.   Incidental adrenal and/or renal findings do not require follow up imaging.   IMPRESSION: 1. Suspected partial small bowel obstruction, likely on the basis of adhesions. 2. Small left paramidline lower abdominal wall fat-containing hernia with mild stranding.   Electronically signed by: Zadie Herter MD 11/22/2023 02:34 AM EDT  RP Workstation: [id number]  Treatments: Diagnostic laparoscopy/ laparoscopic appendectomy 11/25/23 - Dr. Lucienne Ryder  Discharge  Exam: Blood pressure (!) 158/66, pulse (!) 58, temperature 98.4 F (36.9 C), resp. rate 18, height 5\' 3"  (1.6 m), weight 79.6 kg, SpO2 93%. WDWN in NAD Abd - soft, non-distended; incisional tenderness Incisions c/d/i  Disposition: Discharge disposition: 01-Home or Self Care       Discharge Instructions     Call MD for:  persistant nausea and vomiting   Complete by: As directed    Call MD for:  redness, tenderness, or signs of infection (pain, swelling, redness, odor or green/yellow discharge around incision site)   Complete by: As directed    Call MD for:  severe uncontrolled pain   Complete by: As directed    Call MD for:  temperature >100.4   Complete by: As directed    Diet general   Complete by: As directed    Driving Restrictions   Complete by: As directed    Do not drive while taking pain medications   Increase activity slowly   Complete by: As directed    May shower / Bathe   Complete by: As directed       Allergies as of 11/27/2023       Reactions   Codeine Other (See Comments)   Husband & patient unaware of reaction.    Ace Inhibitors Cough   Valsartan, losartan    Sulfa Antibiotics Rash        Medication List     TAKE these medications    atorvastatin  40 MG tablet Commonly known as: LIPITOR Take 40 mg by mouth daily.   clopidogrel  75 MG tablet Commonly known as: PLAVIX  Take 1 tablet (75 mg total) by mouth daily.   olmesartan-hydrochlorothiazide  40-12.5 MG tablet Commonly known as: BENICAR HCT Take 1 tablet by mouth daily.   sertraline  100 MG tablet Commonly known as: ZOLOFT  Take 1 tablet (100 mg total) by mouth daily. What changed: how much to take   traMADol 50 MG tablet Commonly known as: ULTRAM Take 1 tablet (50 mg total) by mouth every 6 (six) hours as needed for moderate pain (pain score 4-6) or severe pain (pain score 7-10).   Vitamin D  50 MCG (2000 UT) tablet Take 2,000 Units by mouth daily.        Follow-up Information      Oza Blumenthal, MD Follow up in 2 week(s).   Specialty: General Surgery Contact information: [address] [place] [address] [phone]                 Signed: Rella Cardinal 11/27/2023, 8:25 AM

## 2023-11-29 LAB — SURGICAL PATHOLOGY
